# Patient Record
Sex: Female | Born: 1965 | Race: Black or African American | Hispanic: No | Marital: Single | Smoking: Never smoker
Health system: Southern US, Community
[De-identification: ages and names within clinical notes are randomized; demographics above are authoritative.]

## PROBLEM LIST (undated history)

## (undated) DIAGNOSIS — E049 Nontoxic goiter, unspecified: Secondary | ICD-10-CM

## (undated) DIAGNOSIS — E039 Hypothyroidism, unspecified: Secondary | ICD-10-CM

## (undated) HISTORY — PX: TUBAL LIGATION: SHX77

## (undated) HISTORY — PX: TONSILLECTOMY: SUR1361

## (undated) HISTORY — DX: Hypothyroidism, unspecified: E03.9

---

## 2006-01-13 ENCOUNTER — Ambulatory Visit: Payer: Self-pay | Admitting: Obstetrics & Gynecology

## 2006-01-27 ENCOUNTER — Ambulatory Visit: Payer: Self-pay

## 2006-04-20 ENCOUNTER — Ambulatory Visit: Payer: Self-pay | Admitting: Otolaryngology

## 2007-02-15 ENCOUNTER — Emergency Department: Payer: Self-pay | Admitting: Emergency Medicine

## 2007-11-21 ENCOUNTER — Ambulatory Visit: Payer: Self-pay

## 2009-01-29 ENCOUNTER — Ambulatory Visit: Payer: Self-pay

## 2009-02-13 ENCOUNTER — Ambulatory Visit: Payer: Self-pay

## 2009-09-26 ENCOUNTER — Emergency Department: Payer: Self-pay | Admitting: Internal Medicine

## 2010-02-18 ENCOUNTER — Ambulatory Visit: Payer: Self-pay | Admitting: Unknown Physician Specialty

## 2010-02-26 ENCOUNTER — Ambulatory Visit: Payer: Self-pay

## 2011-03-17 ENCOUNTER — Ambulatory Visit: Payer: Self-pay

## 2012-04-11 ENCOUNTER — Ambulatory Visit: Payer: Self-pay

## 2013-03-20 ENCOUNTER — Ambulatory Visit: Payer: Self-pay | Admitting: Family Medicine

## 2014-10-16 ENCOUNTER — Ambulatory Visit
Admit: 2014-10-16 | Disposition: A | Discharge status: Home / Self Care | Payer: Self-pay | Attending: Family Medicine | Admitting: Family Medicine

## 2015-02-25 ENCOUNTER — Other Ambulatory Visit: Payer: Self-pay

## 2015-02-25 DIAGNOSIS — Z01419 Encounter for gynecological examination (general) (routine) without abnormal findings: Secondary | ICD-10-CM

## 2015-03-20 ENCOUNTER — Other Ambulatory Visit: Payer: Self-pay | Admitting: Family Medicine

## 2015-03-20 ENCOUNTER — Ambulatory Visit
Admission: RE | Admit: 2015-03-20 | Discharge: 2015-03-20 | Disposition: A | Discharge status: Home / Self Care | Payer: BLUE CROSS/BLUE SHIELD | Source: Ambulatory Visit | Attending: Family Medicine | Admitting: Family Medicine

## 2015-03-20 ENCOUNTER — Ambulatory Visit
Admission: AD | Admit: 2015-03-20 | Discharge: 2015-03-20 | Disposition: A | Discharge status: Home / Self Care | Payer: BLUE CROSS/BLUE SHIELD | Attending: Family Medicine | Admitting: Family Medicine

## 2015-03-20 DIAGNOSIS — IMO0002 Reserved for concepts with insufficient information to code with codable children: Secondary | ICD-10-CM

## 2015-03-20 DIAGNOSIS — R229 Localized swelling, mass and lump, unspecified: Secondary | ICD-10-CM | POA: Diagnosis not present

## 2015-03-20 DIAGNOSIS — R609 Edema, unspecified: Secondary | ICD-10-CM | POA: Insufficient documentation

## 2015-04-25 ENCOUNTER — Other Ambulatory Visit: Payer: Self-pay | Admitting: Otolaryngology

## 2015-04-25 DIAGNOSIS — R221 Localized swelling, mass and lump, neck: Secondary | ICD-10-CM

## 2015-04-28 ENCOUNTER — Ambulatory Visit
Admission: RE | Admit: 2015-04-28 | Discharge: 2015-04-28 | Disposition: A | Discharge status: Home / Self Care | Payer: BLUE CROSS/BLUE SHIELD | Source: Ambulatory Visit | Attending: Otolaryngology | Admitting: Otolaryngology

## 2015-04-28 DIAGNOSIS — E041 Nontoxic single thyroid nodule: Secondary | ICD-10-CM | POA: Insufficient documentation

## 2015-04-28 DIAGNOSIS — R221 Localized swelling, mass and lump, neck: Secondary | ICD-10-CM

## 2015-04-28 MED ORDER — IOHEXOL 300 MG/ML  SOLN
75.0000 mL | Freq: Once | INTRAMUSCULAR | Status: AC | PRN
  Administered 2015-04-28: 75 mL via INTRAVENOUS

## 2015-05-02 ENCOUNTER — Other Ambulatory Visit: Payer: Self-pay | Admitting: Otolaryngology

## 2015-05-02 DIAGNOSIS — E041 Nontoxic single thyroid nodule: Secondary | ICD-10-CM

## 2015-05-07 ENCOUNTER — Other Ambulatory Visit: Payer: Self-pay | Admitting: Radiology

## 2015-05-08 ENCOUNTER — Ambulatory Visit
Admission: RE | Admit: 2015-05-08 | Discharge: 2015-05-08 | Disposition: A | Discharge status: Home / Self Care | Payer: BLUE CROSS/BLUE SHIELD | Source: Ambulatory Visit | Attending: Otolaryngology | Admitting: Otolaryngology

## 2015-05-08 DIAGNOSIS — E041 Nontoxic single thyroid nodule: Secondary | ICD-10-CM | POA: Diagnosis present

## 2015-05-08 NOTE — Discharge Instructions (Signed)
Thyroid Biopsy, Care After  Refer to this sheet in the next few weeks. These instructions provide you with information on caring for yourself after your procedure. Your health care provider may also give you more specific instructions. Your treatment has been planned according to current medical practices, but problems sometimes occur. Call your health care provider if you have any problems or questions after your procedure.   WHAT TO EXPECT AFTER THE PROCEDURE  After your procedure, it is typical to have the following:   You may have soreness and tenderness at the biopsy site for a few days.   You may have a sore throat or a hoarse voice if you had an open biopsy. This should go away after a couple days.  HOME CARE INSTRUCTIONS   Take medicines only as directed by your health care provider.   To ease discomfort at the biopsy site:    Keep your head raised on a pillow when you are lying down.    Support the back of your head and neck with both hands as you sit up from a lying position.    If you have a sore throat, try using throat lozenges or gargling with warm salt water.    Keep all follow-up visits as directed by your health care provider. This is important.  SEEK MEDICAL CARE IF:   You have a fever.  SEEK IMMEDIATE MEDICAL CARE IF:   You have severe bleeding from the biopsy site.    You have difficulty swallowing.    You have drainage, redness, swelling, or pain at the biopsy site.    You have swollen glands (lymph nodes) in your neck.      This information is not intended to replace advice given to you by your health care provider. Make sure you discuss any questions you have with your health care provider.     Document Released: 01/09/2014 Document Reviewed: 01/09/2014  Elsevier Interactive Patient Education 2016 [employer]

## 2015-05-09 LAB — CYTOLOGY - NON PAP

## 2015-05-21 ENCOUNTER — Encounter: Payer: Self-pay | Admitting: *Deleted

## 2015-05-26 NOTE — Discharge Instructions (Signed)

## 2015-05-27 ENCOUNTER — Encounter
Admission: RE | Disposition: A | Discharge status: Home / Self Care | Payer: Self-pay | Source: Ambulatory Visit | Attending: Otolaryngology

## 2015-05-27 ENCOUNTER — Ambulatory Visit: Payer: BLUE CROSS/BLUE SHIELD | Admitting: Anesthesiology

## 2015-05-27 ENCOUNTER — Ambulatory Visit
Admission: RE | Admit: 2015-05-27 | Discharge: 2015-05-27 | Disposition: A | Discharge status: Home / Self Care | Payer: BLUE CROSS/BLUE SHIELD | Source: Ambulatory Visit | Attending: Otolaryngology | Admitting: Otolaryngology

## 2015-05-27 DIAGNOSIS — Z8261 Family history of arthritis: Secondary | ICD-10-CM | POA: Insufficient documentation

## 2015-05-27 DIAGNOSIS — Z8249 Family history of ischemic heart disease and other diseases of the circulatory system: Secondary | ICD-10-CM | POA: Insufficient documentation

## 2015-05-27 DIAGNOSIS — Z79899 Other long term (current) drug therapy: Secondary | ICD-10-CM | POA: Insufficient documentation

## 2015-05-27 DIAGNOSIS — D170 Benign lipomatous neoplasm of skin and subcutaneous tissue of head, face and neck: Secondary | ICD-10-CM | POA: Insufficient documentation

## 2015-05-27 HISTORY — DX: Nontoxic goiter, unspecified: E04.9

## 2015-05-27 HISTORY — PX: EXCISION MASS NECK: SHX6703

## 2015-05-27 SURGERY — EXCISION, MASS, NECK
Anesthesia: General | Site: Neck | Laterality: Right | Wound class: Clean

## 2015-05-27 MED ORDER — DOUBLE ANTIBIOTIC 500-10000 UNIT/GM EX OINT
TOPICAL_OINTMENT | CUTANEOUS | Status: DC | PRN
  Administered 2015-05-27: 1 via TOPICAL

## 2015-05-27 MED ORDER — LACTATED RINGERS IV SOLN
INTRAVENOUS | Status: DC
  Administered 2015-05-27: 11:00:00 via INTRAVENOUS

## 2015-05-27 MED ORDER — FENTANYL CITRATE (PF) 100 MCG/2ML IJ SOLN: 25.0000 ug | INTRAMUSCULAR | Status: DC | PRN

## 2015-05-27 MED ORDER — HYDROCODONE-ACETAMINOPHEN 5-325 MG PO TABS: 1.0000 | ORAL_TABLET | ORAL | Status: DC | PRN

## 2015-05-27 MED ORDER — OXYCODONE HCL 5 MG/5ML PO SOLN: 5.0000 mg | Freq: Once | ORAL | Status: DC | PRN

## 2015-05-27 MED ORDER — DEXAMETHASONE SODIUM PHOSPHATE 4 MG/ML IJ SOLN
INTRAMUSCULAR | Status: DC | PRN
  Administered 2015-05-27: 8 mg via INTRAVENOUS

## 2015-05-27 MED ORDER — LIDOCAINE-EPINEPHRINE 1 %-1:100000 IJ SOLN
INTRAMUSCULAR | Status: DC | PRN
  Administered 2015-05-27: 2 mL

## 2015-05-27 MED ORDER — OXYCODONE HCL 5 MG PO TABS: 5.0000 mg | ORAL_TABLET | Freq: Once | ORAL | Status: DC | PRN

## 2015-05-27 MED ORDER — PROMETHAZINE HCL 25 MG/ML IJ SOLN: 6.2500 mg | INTRAMUSCULAR | Status: DC | PRN

## 2015-05-27 MED ORDER — FENTANYL CITRATE (PF) 100 MCG/2ML IJ SOLN
INTRAMUSCULAR | Status: DC | PRN
  Administered 2015-05-27: 50 ug via INTRAVENOUS

## 2015-05-27 MED ORDER — MIDAZOLAM HCL 5 MG/5ML IJ SOLN
INTRAMUSCULAR | Status: DC | PRN
  Administered 2015-05-27: 2 mg via INTRAVENOUS

## 2015-05-27 MED ORDER — ONDANSETRON HCL 4 MG/2ML IJ SOLN
INTRAMUSCULAR | Status: DC | PRN
  Administered 2015-05-27: 4 mg via INTRAVENOUS

## 2015-05-27 MED ORDER — PROPOFOL 10 MG/ML IV BOLUS
INTRAVENOUS | Status: DC | PRN
  Administered 2015-05-27: 100 mg via INTRAVENOUS

## 2015-05-27 MED ORDER — LIDOCAINE HCL (CARDIAC) 20 MG/ML IV SOLN
INTRAVENOUS | Status: DC | PRN
  Administered 2015-05-27: 10 mg via INTRATRACHEAL

## 2015-05-27 MED ORDER — GLYCOPYRROLATE 0.2 MG/ML IJ SOLN
INTRAMUSCULAR | Status: DC | PRN
  Administered 2015-05-27: .2 mg via INTRAVENOUS

## 2015-05-27 SURGICAL SUPPLY — 36 items
BLADE SURG 15 STRL LF DISP TIS (BLADE) IMPLANT
BLADE SURG 15 STRL SS (BLADE)
CANISTER SUCT 1200ML W/VALVE (MISCELLANEOUS) IMPLANT
CORD BIP STRL DISP 12FT (MISCELLANEOUS) ×2 IMPLANT
DECANTER SPIKE VIAL GLASS SM (MISCELLANEOUS) ×2 IMPLANT
DERMABOND ADVANCED (GAUZE/BANDAGES/DRESSINGS) ×1
DERMABOND ADVANCED .7 DNX12 (GAUZE/BANDAGES/DRESSINGS) ×1 IMPLANT
DRESSING TELFA 4X3 1S ST N-ADH (GAUZE/BANDAGES/DRESSINGS) ×2 IMPLANT
DRSG TEGADERM 2-3/8X2-3/4 SM (GAUZE/BANDAGES/DRESSINGS) IMPLANT
DRSG TEGADERM 4X4.75 (GAUZE/BANDAGES/DRESSINGS) ×2 IMPLANT
DRSG TELFA 3X8 NADH (GAUZE/BANDAGES/DRESSINGS) ×2 IMPLANT
ELECT CAUTERY BLADE TIP 2.5 (TIP)
ELECTRODE CAUTERY BLDE TIP 2.5 (TIP) IMPLANT
GAUZE SPONGE 4X4 12PLY STRL (GAUZE/BANDAGES/DRESSINGS) ×2 IMPLANT
GLOVE BIO SURGEON STRL SZ7.5 (GLOVE) ×4 IMPLANT
GOWN STRL REUS W/ TWL LRG LVL3 (GOWN DISPOSABLE) IMPLANT
GOWN STRL REUS W/TWL LRG LVL3 (GOWN DISPOSABLE)
KIT ROOM TURNOVER OR (KITS) ×2 IMPLANT
NEEDLE HYPO 25GX1X1/2 BEV (NEEDLE) ×2 IMPLANT
NS IRRIG 500ML POUR BTL (IV SOLUTION) ×2 IMPLANT
PACK HEAD/NECK (MISCELLANEOUS) ×2 IMPLANT
PAD GROUND ADULT SPLIT (MISCELLANEOUS) IMPLANT
SPONGE KITTNER 5P (MISCELLANEOUS) IMPLANT
STAPLER SKIN PROX 35W (STAPLE) IMPLANT
SUCTION FRAZIER TIP 10 FR DISP (SUCTIONS) ×2 IMPLANT
SUT PROLENE 5 0 P 3 (SUTURE) ×2 IMPLANT
SUT SILK 2 0 (SUTURE)
SUT SILK 2-0 18XBRD TIE 12 (SUTURE) IMPLANT
SUT SILK 3 0 (SUTURE)
SUT SILK 3-0 18XBRD TIE 12 (SUTURE) IMPLANT
SUT VIC AB 4-0 FS2 27 (SUTURE) IMPLANT
SUT VIC AB 4-0 RB1 18 (SUTURE) IMPLANT
SUT VIC AB 4-0 RB1 27 (SUTURE) ×1
SUT VIC AB 4-0 RB1 27X BRD (SUTURE) ×1 IMPLANT
SYR BULB IRRIG 60ML STRL (SYRINGE) ×2 IMPLANT
SYRINGE 10CC LL (SYRINGE) ×2 IMPLANT

## 2015-05-27 NOTE — Anesthesia Procedure Notes (Signed)
Procedure Name: LMA Insertion Date/Time: 05/27/2015 12:42 PM Performed by: Londell Moh Pre-anesthesia Checklist: Patient identified, Emergency Drugs available, Suction available, Timeout performed and Patient being monitored Patient Re-evaluated:Patient Re-evaluated prior to inductionOxygen Delivery Method: Circle system utilized Preoxygenation: Pre-oxygenation with 100% oxygen Intubation Type: IV induction LMA: LMA inserted LMA Size: 4.0 Number of attempts: 1 Placement Confirmation: positive ETCO2 and breath sounds checked- equal and bilateral Tube secured with: Tape

## 2015-05-27 NOTE — Anesthesia Preprocedure Evaluation (Signed)
Anesthesia Evaluation  Patient identified by MRN, date of birth, ID band Patient awake    Reviewed: Allergy & Precautions, NPO status , Patient's Chart, lab work & pertinent test results  Airway Mallampati: I       Dental   Pulmonary neg pulmonary ROS,           Cardiovascular negative cardio ROS    Mets 4+   Neuro/Psych negative neurological ROS  negative psych ROS   GI/Hepatic negative GI ROS, Neg liver ROS,   Endo/Other  negative endocrine ROS  Renal/GU negative Renal ROS  negative genitourinary   Musculoskeletal negative musculoskeletal ROS (+)   Abdominal   Peds negative pediatric ROS (+)  Hematology negative hematology ROS (+)   Anesthesia Other Findings Pt with a 17 mm X 13 mm thyroid mass.  Non-secreting.  Cyst does not affect airway.  Pt can recline and breath without difficulty.  Reproductive/Obstetrics negative OB ROS                             Anesthesia Physical Anesthesia Plan  ASA: II  Anesthesia Plan: General   Post-op Pain Management:    Induction: Intravenous  Airway Management Planned: LMA  Additional Equipment:   Intra-op Plan:   Post-operative Plan: Extubation in OR  Informed Consent: I have reviewed the patients History and Physical, chart, labs and discussed the procedure including the risks, benefits and alternatives for the proposed anesthesia with the patient or authorized representative who has indicated his/her understanding and acceptance.   Dental advisory given  Plan Discussed with: CRNA  Anesthesia Plan Comments:         Anesthesia Quick Evaluation

## 2015-05-27 NOTE — Anesthesia Postprocedure Evaluation (Signed)
Anesthesia Post Note  Patient: Ashley [name]  Procedure(s) Performed: Procedure(s) (LRB): EXCISION MASS NECK RIGHT LIPOMA (Right)  Patient location during evaluation: PACU Anesthesia Type: General Level of consciousness: awake and alert Pain management: pain level controlled Vital Signs Assessment: post-procedure vital signs reviewed and stable Respiratory status: spontaneous breathing, nonlabored ventilation, respiratory function stable and patient connected to nasal cannula oxygen Cardiovascular status: blood pressure returned to baseline and stable Postop Assessment: no signs of nausea or vomiting Anesthetic complications: no    Brexley Cutshaw C

## 2015-05-27 NOTE — Op Note (Signed)
05/27/2015  1:29 PM    Ashley [name]  [id number]   Pre-Op Diagnosis:  RIGHT NECK LIPOMA Post-op Diagnosis: RIGHT NECK LIPOMA  Procedure:   Excision of right lower neck lipoma, intermediate closure (3 cm closure)  Surgeon:  Riley Nearing  Anesthesia:  General with laryngeal mask airway  EBL:  Minimal  Complications:  None  Findings: 4 x 2.5 cm lipoma in the lower neck above the right clavicle  Procedure: After the patient was identified in holding and the procedure was reviewed.  The patient was taken to the operating room and with the patient in a comfortable supine position,  general laryngeal mask anesthesia was induced without difficulty.  A proper time-out was performed, confirming the operative site and procedure.  The skin over the lipoma was injected with 1% lidocaine with epinephrine 1 100,000, approximately 2 cc. The patient was then prepped and draped in usual sterile fashion.  A 15 blade was used to incise the skin. Dissection proceeded down through the platysma which was divided with the bipolar cautery. The capsule of the lipoma was encountered and careful dissection both blunt and sharp around the capsule of the lipoma was performed, dividing small capillaries with the bipolar cautery for hemostasis. The lipoma was relatively well encapsulated and excised completely in this fashion. Hemostasis was excellent. The dead space was closed using 4-0 Vicryl suture in an interrupted fashion. The platysma and subcutaneous tissues were then closed with 4-0 Vicryl followed by 5-0 Prolene in a running locked stitch for the skin. A dressing was then applied over the wound after application of bacitracin ointment.  The patient was then returned to the anesthesiologist in good condition for awakening. The patient was awakened and taken to the recovery room in good condition.   Disposition:   PACU then discharge home  Plan: Polysporin to wound twice daily, clean wound as  needed with hydrogen peroxide.  Riley Nearing 05/27/2015 1:29 PM

## 2015-05-27 NOTE — Transfer of Care (Signed)
Immediate Anesthesia Transfer of Care Note  Patient: Ashley [name]  Procedure(s) Performed: Procedure(s): EXCISION MASS NECK RIGHT LIPOMA (Right)  Patient Location: PACU  Anesthesia Type: General  Level of Consciousness: awake, alert  and patient cooperative  Airway and Oxygen Therapy: Patient Spontanous Breathing and Patient connected to supplemental oxygen  Post-op Assessment: Post-op Vital signs reviewed, Patient's Cardiovascular Status Stable, Respiratory Function Stable, Patent Airway and No signs of Nausea or vomiting  Post-op Vital Signs: Reviewed and stable  Complications: No apparent anesthesia complications

## 2015-05-28 ENCOUNTER — Encounter: Payer: Self-pay | Admitting: Otolaryngology

## 2015-05-29 LAB — SURGICAL PATHOLOGY

## 2015-11-04 ENCOUNTER — Encounter: Payer: Self-pay | Admitting: Obstetrics and Gynecology

## 2015-11-04 ENCOUNTER — Ambulatory Visit (INDEPENDENT_AMBULATORY_CARE_PROVIDER_SITE_OTHER): Payer: BLUE CROSS/BLUE SHIELD | Admitting: Obstetrics and Gynecology

## 2015-11-04 VITALS — BP 123/81 | HR 88 | Ht 65.0 in | Wt 184.7 lb

## 2015-11-04 DIAGNOSIS — Z01419 Encounter for gynecological examination (general) (routine) without abnormal findings: Secondary | ICD-10-CM

## 2015-11-04 DIAGNOSIS — N949 Unspecified condition associated with female genital organs and menstrual cycle: Secondary | ICD-10-CM | POA: Diagnosis not present

## 2015-11-04 DIAGNOSIS — Z1211 Encounter for screening for malignant neoplasm of colon: Secondary | ICD-10-CM | POA: Diagnosis not present

## 2015-11-04 DIAGNOSIS — E669 Obesity, unspecified: Secondary | ICD-10-CM | POA: Diagnosis not present

## 2015-11-04 DIAGNOSIS — N943 Premenstrual tension syndrome: Secondary | ICD-10-CM

## 2015-11-04 MED ORDER — NORETHINDRONE 0.35 MG PO TABS: 1.0000 | ORAL_TABLET | Freq: Every day | ORAL | Status: DC
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
## 2015-11-04 NOTE — Patient Instructions (Addendum)
Begin Sunday start after next period for birth control.      Preventive Care for Adults, Female A healthy lifestyle and preventive care can promote health and wellness. Preventive health guidelines for women include the following key practices.  A routine yearly physical is a good way to check with your health care provider about your health and preventive screening. It is a chance to share any concerns and updates on your health and to receive a thorough exam.  Visit your dentist for a routine exam and preventive care every 6 months. Brush your teeth twice a day and floss once a day. Good oral hygiene prevents tooth decay and gum disease.  The frequency of eye exams is based on your age, health, family medical history, use of contact lenses, and other factors. Follow your health care provider's recommendations for frequency of eye exams.  Eat a healthy diet. Foods like vegetables, fruits, whole grains, low-fat dairy products, and lean protein foods contain the nutrients you need without too many calories. Decrease your intake of foods high in solid fats, added sugars, and salt. Eat the right amount of calories for you.Get information about a proper diet from your health care provider, if necessary.  Regular physical exercise is one of the most important things you can do for your health. Most adults should get at least 150 minutes of moderate-intensity exercise (any activity that increases your heart rate and causes you to sweat) each week. In addition, most adults need muscle-strengthening exercises on 2 or more days a week.  Maintain a healthy weight. The body mass index (BMI) is a screening tool to identify possible weight problems. It provides an estimate of body fat based on height and weight. Your health care provider can find your BMI and can help you achieve or maintain a healthy weight.For adults 20 years and older:  A BMI below 18.5 is considered underweight.  A BMI of 18.5 to 24.9  is normal.  A BMI of 25 to 29.9 is considered overweight.  A BMI of 30 and above is considered obese.  Maintain normal blood lipids and cholesterol levels by exercising and minimizing your intake of saturated fat. Eat a balanced diet with plenty of fruit and vegetables. Blood tests for lipids and cholesterol should begin at age 26 and be repeated every 5 years. If your lipid or cholesterol levels are high, you are over 50, or you are at high risk for heart disease, you may need your cholesterol levels checked more frequently.Ongoing high lipid and cholesterol levels should be treated with medicines if diet and exercise are not working.  If you smoke, find out from your health care provider how to quit. If you do not use tobacco, do not start.  Lung cancer screening is recommended for adults aged 84-80 years who are at high risk for developing lung cancer because of a history of smoking. A yearly low-dose CT scan of the lungs is recommended for people who have at least a 30-pack-year history of smoking and are a current smoker or have quit within the past 15 years. A pack year of smoking is smoking an average of 1 pack of cigarettes a day for 1 year (for example: 1 pack a day for 30 years or 2 packs a day for 15 years). Yearly screening should continue until the smoker has stopped smoking for at least 15 years. Yearly screening should be stopped for people who develop a health problem that would prevent them from having lung  cancer treatment.  If you are pregnant, do not drink alcohol. If you are breastfeeding, be very cautious about drinking alcohol. If you are not pregnant and choose to drink alcohol, do not have more than 1 drink per day. One drink is considered to be 12 ounces (355 mL) of beer, 5 ounces (148 mL) of wine, or 1.5 ounces (44 mL) of liquor.  Avoid use of street drugs. Do not share needles with anyone. Ask for help if you need support or instructions about stopping the use of  drugs.  High blood pressure causes heart disease and increases the risk of stroke. Your blood pressure should be checked at least every 1 to 2 years. Ongoing high blood pressure should be treated with medicines if weight loss and exercise do not work.  If you are 55-79 years old, ask your health care provider if you should take aspirin to prevent strokes.  Diabetes screening is done by taking a blood sample to check your blood glucose level after you have not eaten for a certain period of time (fasting). If you are not overweight and you do not have risk factors for diabetes, you should be screened once every 3 years starting at age 45. If you are overweight or obese and you are 40-70 years of age, you should be screened for diabetes every year as part of your cardiovascular risk assessment.  Breast cancer screening is essential preventive care for women. You should practice "breast self-awareness." This means understanding the normal appearance and feel of your breasts and may include breast self-examination. Any changes detected, no matter how small, should be reported to a health care provider. Women in their 20s and 30s should have a clinical breast exam (CBE) by a health care provider as part of a regular health exam every 1 to 3 years. After age 40, women should have a CBE every year. Starting at age 40, women should consider having a mammogram (breast X-ray test) every year. Women who have a family history of breast cancer should talk to their health care provider about genetic screening. Women at a high risk of breast cancer should talk to their health care providers about having an MRI and a mammogram every year.  Breast cancer gene (BRCA)-related cancer risk assessment is recommended for women who have family members with BRCA-related cancers. BRCA-related cancers include breast, ovarian, tubal, and peritoneal cancers. Having family members with these cancers may be associated with an increased  risk for harmful changes (mutations) in the breast cancer genes BRCA1 and BRCA2. Results of the assessment will determine the need for genetic counseling and BRCA1 and BRCA2 testing.  Your health care provider may recommend that you be screened regularly for cancer of the pelvic organs (ovaries, uterus, and vagina). This screening involves a pelvic examination, including checking for microscopic changes to the surface of your cervix (Pap test). You may be encouraged to have this screening done every 3 years, beginning at age 21.  For women ages 30-65, health care providers may recommend pelvic exams and Pap testing every 3 years, or they may recommend the Pap and pelvic exam, combined with testing for human papilloma virus (HPV), every 5 years. Some types of HPV increase your risk of cervical cancer. Testing for HPV may also be done on women of any age with unclear Pap test results.  Other health care providers may not recommend any screening for nonpregnant women who are considered low risk for pelvic cancer and who do not have   symptoms. Ask your health care provider if a screening pelvic exam is right for you.  If you have had past treatment for cervical cancer or a condition that could lead to cancer, you need Pap tests and screening for cancer for at least 20 years after your treatment. If Pap tests have been discontinued, your risk factors (such as having a new sexual partner) need to be reassessed to determine if screening should resume. Some women have medical problems that increase the chance of getting cervical cancer. In these cases, your health care provider may recommend more frequent screening and Pap tests.  Colorectal cancer can be detected and often prevented. Most routine colorectal cancer screening begins at the age of 50 years and continues through age 75 years. However, your health care provider may recommend screening at an earlier age if you have risk factors for colon cancer. On a  yearly basis, your health care provider may provide home test kits to check for hidden blood in the stool. Use of a small camera at the end of a tube, to directly examine the colon (sigmoidoscopy or colonoscopy), can detect the earliest forms of colorectal cancer. Talk to your health care provider about this at age 50, when routine screening begins. Direct exam of the colon should be repeated every 5-10 years through age 75 years, unless early forms of precancerous polyps or small growths are found.  People who are at an increased risk for hepatitis B should be screened for this virus. You are considered at high risk for hepatitis B if:  You were born in a country where hepatitis B occurs often. Talk with your health care provider about which countries are considered high risk.  Your parents were born in a high-risk country and you have not received a shot to protect against hepatitis B (hepatitis B vaccine).  You have HIV or AIDS.  You use needles to inject street drugs.  You live with, or have sex with, someone who has hepatitis B.  You get hemodialysis treatment.  You take certain medicines for conditions like cancer, organ transplantation, and autoimmune conditions.  Hepatitis C blood testing is recommended for all people born from 1945 through 1965 and any individual with known risks for hepatitis C.  Practice safe sex. Use condoms and avoid high-risk sexual practices to reduce the spread of sexually transmitted infections (STIs). STIs include gonorrhea, chlamydia, syphilis, trichomonas, herpes, HPV, and human immunodeficiency virus (HIV). Herpes, HIV, and HPV are viral illnesses that have no cure. They can result in disability, cancer, and death.  You should be screened for sexually transmitted illnesses (STIs) including gonorrhea and chlamydia if:  You are sexually active and are younger than 24 years.  You are older than 24 years and your health care provider tells you that you are  at risk for this type of infection.  Your sexual activity has changed since you were last screened and you are at an increased risk for chlamydia or gonorrhea. Ask your health care provider if you are at risk.  If you are at risk of being infected with HIV, it is recommended that you take a prescription medicine daily to prevent HIV infection. This is called preexposure prophylaxis (PrEP). You are considered at risk if:  You are sexually active and do not regularly use condoms or know the HIV status of your partner(s).  You take drugs by injection.  You are sexually active with a partner who has HIV.  Talk with your health care   provider about whether you are at high risk of being infected with HIV. If you choose to begin PrEP, you should first be tested for HIV. You should then be tested every 3 months for as long as you are taking PrEP.  Osteoporosis is a disease in which the bones lose minerals and strength with aging. This can result in serious bone fractures or breaks. The risk of osteoporosis can be identified using a bone density scan. Women ages 65 years and over and women at risk for fractures or osteoporosis should discuss screening with their health care providers. Ask your health care provider whether you should take a calcium supplement or vitamin D to reduce the rate of osteoporosis.  Menopause can be associated with physical symptoms and risks. Hormone replacement therapy is available to decrease symptoms and risks. You should talk to your health care provider about whether hormone replacement therapy is right for you.  Use sunscreen. Apply sunscreen liberally and repeatedly throughout the day. You should seek shade when your shadow is shorter than you. Protect yourself by wearing long sleeves, pants, a wide-brimmed hat, and sunglasses year round, whenever you are outdoors.  Once a month, do a whole body skin exam, using a mirror to look at the skin on your back. Tell your health  care provider of new moles, moles that have irregular borders, moles that are larger than a pencil eraser, or moles that have changed in shape or color.  Stay current with required vaccines (immunizations).  Influenza vaccine. All adults should be immunized every year.  Tetanus, diphtheria, and acellular pertussis (Td, Tdap) vaccine. Pregnant women should receive 1 dose of Tdap vaccine during each pregnancy. The dose should be obtained regardless of the length of time since the last dose. Immunization is preferred during the 27th-36th week of gestation. An adult who has not previously received Tdap or who does not know her vaccine status should receive 1 dose of Tdap. This initial dose should be followed by tetanus and diphtheria toxoids (Td) booster doses every 10 years. Adults with an unknown or incomplete history of completing a 3-dose immunization series with Td-containing vaccines should begin or complete a primary immunization series including a Tdap dose. Adults should receive a Td booster every 10 years.  Varicella vaccine. An adult without evidence of immunity to varicella should receive 2 doses or a second dose if she has previously received 1 dose. Pregnant females who do not have evidence of immunity should receive the first dose after pregnancy. This first dose should be obtained before leaving the health care facility. The second dose should be obtained 4-8 weeks after the first dose.  Human papillomavirus (HPV) vaccine. Females aged 13-26 years who have not received the vaccine previously should obtain the 3-dose series. The vaccine is not recommended for use in pregnant females. However, pregnancy testing is not needed before receiving a dose. If a female is found to be pregnant after receiving a dose, no treatment is needed. In that case, the remaining doses should be delayed until after the pregnancy. Immunization is recommended for any person with an immunocompromised condition through  the age of 26 years if she did not get any or all doses earlier. During the 3-dose series, the second dose should be obtained 4-8 weeks after the first dose. The third dose should be obtained 24 weeks after the first dose and 16 weeks after the second dose.  Zoster vaccine. One dose is recommended for adults aged 60 years or older   unless certain conditions are present.  Measles, mumps, and rubella (MMR) vaccine. Adults born before [birth year] generally are considered immune to measles and mumps. Adults born in [birth year] or later should have 1 or more doses of MMR vaccine unless there is a contraindication to the vaccine or there is laboratory evidence of immunity to each of the three diseases. A routine second dose of MMR vaccine should be obtained at least 28 days after the first dose for students attending postsecondary schools, health care workers, or international travelers. People who received inactivated measles vaccine or an unknown type of measles vaccine during 1963-1967 should receive 2 doses of MMR vaccine. People who received inactivated mumps vaccine or an unknown type of mumps vaccine before 1979 and are at high risk for mumps infection should consider immunization with 2 doses of MMR vaccine. For females of childbearing age, rubella immunity should be determined. If there is no evidence of immunity, females who are not pregnant should be vaccinated. If there is no evidence of immunity, females who are pregnant should delay immunization until after pregnancy. Unvaccinated health care workers born before [birth year] who lack laboratory evidence of measles, mumps, or rubella immunity or laboratory confirmation of disease should consider measles and mumps immunization with 2 doses of MMR vaccine or rubella immunization with 1 dose of MMR vaccine.  Pneumococcal 13-valent conjugate (PCV13) vaccine. When indicated, a person who is uncertain of his immunization history and has no record of immunization should receive the  PCV13 vaccine. All adults 67 years of age and older should receive this vaccine. An adult aged 15 years or older who has certain medical conditions and has not been previously immunized should receive 1 dose of PCV13 vaccine. This PCV13 should be followed with a dose of pneumococcal polysaccharide (PPSV23) vaccine. Adults who are at high risk for pneumococcal disease should obtain the PPSV23 vaccine at least 8 weeks after the dose of PCV13 vaccine. Adults older than 49 years of age who have normal immune system function should obtain the PPSV23 vaccine dose at least 1 year after the dose of PCV13 vaccine.  Pneumococcal polysaccharide (PPSV23) vaccine. When PCV13 is also indicated, PCV13 should be obtained first. All adults aged 68 years and older should be immunized. An adult younger than age 24 years who has certain medical conditions should be immunized. Any person who resides in a nursing home or long-term care facility should be immunized. An adult smoker should be immunized. People with an immunocompromised condition and certain other conditions should receive both PCV13 and PPSV23 vaccines. People with human immunodeficiency virus (HIV) infection should be immunized as soon as possible after diagnosis. Immunization during chemotherapy or radiation therapy should be avoided. Routine use of PPSV23 vaccine is not recommended for American Indians, [place] Natives, or people younger than 65 years unless there are medical conditions that require PPSV23 vaccine. When indicated, people who have unknown immunization and have no record of immunization should receive PPSV23 vaccine. One-time revaccination 5 years after the first dose of PPSV23 is recommended for people aged 19-64 years who have chronic kidney failure, nephrotic syndrome, asplenia, or immunocompromised conditions. People who received 1-2 doses of PPSV23 before age 11 years should receive another dose of PPSV23 vaccine at age 25 years or later if at least  5 years have passed since the previous dose. Doses of PPSV23 are not needed for people immunized with PPSV23 at or after age 12 years.  Meningococcal vaccine. Adults with asplenia or persistent complement component deficiencies should  receive 2 doses of quadrivalent meningococcal conjugate (MenACWY-D) vaccine. The doses should be obtained at least 2 months apart. Microbiologists working with certain meningococcal bacteria, military recruits, people at risk during an outbreak, and people who travel to or live in countries with a high rate of meningitis should be immunized. A first-year college student up through age 21 years who is living in a residence hall should receive a dose if she did not receive a dose on or after her 16th birthday. Adults who have certain high-risk conditions should receive one or more doses of vaccine.  Hepatitis A vaccine. Adults who wish to be protected from this disease, have certain high-risk conditions, work with hepatitis A-infected animals, work in hepatitis A research labs, or travel to or work in countries with a high rate of hepatitis A should be immunized. Adults who were previously unvaccinated and who anticipate close contact with an international adoptee during the first 60 days after arrival in the [place] from a country with a high rate of hepatitis A should be immunized.  Hepatitis B vaccine. Adults who wish to be protected from this disease, have certain high-risk conditions, may be exposed to blood or other infectious body fluids, are household contacts or sex partners of hepatitis B positive people, are clients or workers in certain care facilities, or travel to or work in countries with a high rate of hepatitis B should be immunized.  Haemophilus influenzae type b (Hib) vaccine. A previously unvaccinated person with asplenia or sickle cell disease or having a scheduled splenectomy should receive 1 dose of Hib vaccine. Regardless of previous immunization, a  recipient of a hematopoietic stem cell transplant should receive a 3-dose series 6-12 months after her successful transplant. Hib vaccine is not recommended for adults with HIV infection. Preventive Services / Frequency Ages 19 to 39 years  Blood pressure check.** / Every 3-5 years.  Lipid and cholesterol check.** / Every 5 years beginning at age 20.  Clinical breast exam.** / Every 3 years for women in their 20s and 30s.  BRCA-related cancer risk assessment.** / For women who have family members with a BRCA-related cancer (breast, ovarian, tubal, or peritoneal cancers).  Pap test.** / Every 2 years from ages 21 through 29. Every 3 years starting at age 30 through age 65 or 70 with a history of 3 consecutive normal Pap tests.  HPV screening.** / Every 3 years from ages 30 through ages 65 to 70 with a history of 3 consecutive normal Pap tests.  Hepatitis C blood test.** / For any individual with known risks for hepatitis C.  Skin self-exam. / Monthly.  Influenza vaccine. / Every year.  Tetanus, diphtheria, and acellular pertussis (Tdap, Td) vaccine.** / Consult your health care provider. Pregnant women should receive 1 dose of Tdap vaccine during each pregnancy. 1 dose of Td every 10 years.  Varicella vaccine.** / Consult your health care provider. Pregnant females who do not have evidence of immunity should receive the first dose after pregnancy.  HPV vaccine. / 3 doses over 6 months, if 26 and younger. The vaccine is not recommended for use in pregnant females. However, pregnancy testing is not needed before receiving a dose.  Measles, mumps, rubella (MMR) vaccine.** / You need at least 1 dose of MMR if you were born in 1957 or later. You may also need a 2nd dose. For females of childbearing age, rubella immunity should be determined. If there is no evidence of immunity, females who are not   pregnant should be vaccinated. If there is no evidence of immunity, females who are pregnant  should delay immunization until after pregnancy.  Pneumococcal 13-valent conjugate (PCV13) vaccine.** / Consult your health care provider.  Pneumococcal polysaccharide (PPSV23) vaccine.** / 1 to 2 doses if you smoke cigarettes or if you have certain conditions.  Meningococcal vaccine.** / 1 dose if you are age 19 to 21 years and a first-year college student living in a residence hall, or have one of several medical conditions, you need to get vaccinated against meningococcal disease. You may also need additional booster doses.  Hepatitis A vaccine.** / Consult your health care provider.  Hepatitis B vaccine.** / Consult your health care provider.  Haemophilus influenzae type b (Hib) vaccine.** / Consult your health care provider. Ages 40 to 64 years  Blood pressure check.** / Every year.  Lipid and cholesterol check.** / Every 5 years beginning at age 20 years.  Lung cancer screening. / Every year if you are aged 55-80 years and have a 30-pack-year history of smoking and currently smoke or have quit within the past 15 years. Yearly screening is stopped once you have quit smoking for at least 15 years or develop a health problem that would prevent you from having lung cancer treatment.  Clinical breast exam.** / Every year after age 40 years.  BRCA-related cancer risk assessment.** / For women who have family members with a BRCA-related cancer (breast, ovarian, tubal, or peritoneal cancers).  Mammogram.** / Every year beginning at age 40 years and continuing for as long as you are in good health. Consult with your health care provider.  Pap test.** / Every 3 years starting at age 30 years through age 65 or 70 years with a history of 3 consecutive normal Pap tests.  HPV screening.** / Every 3 years from ages 30 years through ages 65 to 70 years with a history of 3 consecutive normal Pap tests.  Fecal occult blood test (FOBT) of stool. / Every year beginning at age 50 years and  continuing until age 75 years. You may not need to do this test if you get a colonoscopy every 10 years.  Flexible sigmoidoscopy or colonoscopy.** / Every 5 years for a flexible sigmoidoscopy or every 10 years for a colonoscopy beginning at age 50 years and continuing until age 75 years.  Hepatitis C blood test.** / For all people born from 1945 through 1965 and any individual with known risks for hepatitis C.  Skin self-exam. / Monthly.  Influenza vaccine. / Every year.  Tetanus, diphtheria, and acellular pertussis (Tdap/Td) vaccine.** / Consult your health care provider. Pregnant women should receive 1 dose of Tdap vaccine during each pregnancy. 1 dose of Td every 10 years.  Varicella vaccine.** / Consult your health care provider. Pregnant females who do not have evidence of immunity should receive the first dose after pregnancy.  Zoster vaccine.** / 1 dose for adults aged 60 years or older.  Measles, mumps, rubella (MMR) vaccine.** / You need at least 1 dose of MMR if you were born in 1957 or later. You may also need a second dose. For females of childbearing age, rubella immunity should be determined. If there is no evidence of immunity, females who are not pregnant should be vaccinated. If there is no evidence of immunity, females who are pregnant should delay immunization until after pregnancy.  Pneumococcal 13-valent conjugate (PCV13) vaccine.** / Consult your health care provider.  Pneumococcal polysaccharide (PPSV23) vaccine.** / 1 to 2 doses   if you smoke cigarettes or if you have certain conditions.  Meningococcal vaccine.** / Consult your health care provider.  Hepatitis A vaccine.** / Consult your health care provider.  Hepatitis B vaccine.** / Consult your health care provider.  Haemophilus influenzae type b (Hib) vaccine.** / Consult your health care provider. Ages 65 years and over  Blood pressure check.** / Every year.  Lipid and cholesterol check.** / Every 5 years  beginning at age 20 years.  Lung cancer screening. / Every year if you are aged 55-80 years and have a 30-pack-year history of smoking and currently smoke or have quit within the past 15 years. Yearly screening is stopped once you have quit smoking for at least 15 years or develop a health problem that would prevent you from having lung cancer treatment.  Clinical breast exam.** / Every year after age 40 years.  BRCA-related cancer risk assessment.** / For women who have family members with a BRCA-related cancer (breast, ovarian, tubal, or peritoneal cancers).  Mammogram.** / Every year beginning at age 40 years and continuing for as long as you are in good health. Consult with your health care provider.  Pap test.** / Every 3 years starting at age 30 years through age 65 or 70 years with 3 consecutive normal Pap tests. Testing can be stopped between 65 and 70 years with 3 consecutive normal Pap tests and no abnormal Pap or HPV tests in the past 10 years.  HPV screening.** / Every 3 years from ages 30 years through ages 65 or 70 years with a history of 3 consecutive normal Pap tests. Testing can be stopped between 65 and 70 years with 3 consecutive normal Pap tests and no abnormal Pap or HPV tests in the past 10 years.  Fecal occult blood test (FOBT) of stool. / Every year beginning at age 50 years and continuing until age 75 years. You may not need to do this test if you get a colonoscopy every 10 years.  Flexible sigmoidoscopy or colonoscopy.** / Every 5 years for a flexible sigmoidoscopy or every 10 years for a colonoscopy beginning at age 50 years and continuing until age 75 years.  Hepatitis C blood test.** / For all people born from 1945 through 1965 and any individual with known risks for hepatitis C.  Osteoporosis screening.** / A one-time screening for women ages 65 years and over and women at risk for fractures or osteoporosis.  Skin self-exam. / Monthly.  Influenza vaccine. /  Every year.  Tetanus, diphtheria, and acellular pertussis (Tdap/Td) vaccine.** / 1 dose of Td every 10 years.  Varicella vaccine.** / Consult your health care provider.  Zoster vaccine.** / 1 dose for adults aged 60 years or older.  Pneumococcal 13-valent conjugate (PCV13) vaccine.** / Consult your health care provider.  Pneumococcal polysaccharide (PPSV23) vaccine.** / 1 dose for all adults aged 65 years and older.  Meningococcal vaccine.** / Consult your health care provider.  Hepatitis A vaccine.** / Consult your health care provider.  Hepatitis B vaccine.** / Consult your health care provider.  Haemophilus influenzae type b (Hib) vaccine.** / Consult your health care provider. ** Family history and personal history of risk and conditions may change your health care provider's recommendations.   This information is not intended to replace advice given to you by your health care provider. Make sure you discuss any questions you have with your health care provider.   Document Released: 08/10/2001 Document Revised: 07/05/2014 Document Reviewed: 11/09/2010 Elsevier Interactive Patient Education 2016   [employer].

## 2015-11-05 LAB — COMPREHENSIVE METABOLIC PANEL
A/G RATIO: 1.8 (ref 1.2–2.2)
ALK PHOS: 59 IU/L (ref 39–117)
ALT: 13 IU/L (ref 0–32)
AST: 16 IU/L (ref 0–40)
Albumin: 4.4 g/dL (ref 3.5–5.5)
BILIRUBIN TOTAL: 0.2 mg/dL (ref 0.0–1.2)
BUN/Creatinine Ratio: 13 (ref 9–23)
BUN: 12 mg/dL (ref 6–24)
CALCIUM: 9.4 mg/dL (ref 8.7–10.2)
CHLORIDE: 103 mmol/L (ref 96–106)
CO2: 22 mmol/L (ref 18–29)
Creatinine, Ser: 0.9 mg/dL (ref 0.57–1.00)
GFR calc Af Amer: 87 mL/min/{1.73_m2} (ref >59)
GFR calc non Af Amer: 76 mL/min/{1.73_m2} (ref >59)
Globulin, Total: 2.5 g/dL (ref 1.5–4.5)
Glucose: 71 mg/dL (ref 65–99)
POTASSIUM: 4.1 mmol/L (ref 3.5–5.2)
Sodium: 141 mmol/L (ref 134–144)
Total Protein: 6.9 g/dL (ref 6.0–8.5)

## 2015-11-05 LAB — CBC WITH DIFFERENTIAL/PLATELET
BASOS: 0 %
Basophils Absolute: 0 10*3/uL (ref 0.0–0.2)
EOS (ABSOLUTE): 0.1 10*3/uL (ref 0.0–0.4)
EOS: 1 %
HEMATOCRIT: 39.2 % (ref 34.0–46.6)
HEMOGLOBIN: 13.4 g/dL (ref 11.1–15.9)
Immature Grans (Abs): 0 10*3/uL (ref 0.0–0.1)
Immature Granulocytes: 0 %
LYMPHS ABS: 1.4 10*3/uL (ref 0.7–3.1)
Lymphs: 19 %
MCH: 31.2 pg (ref 26.6–33.0)
MCHC: 34.2 g/dL (ref 31.5–35.7)
MCV: 91 fL (ref 79–97)
MONOCYTES: 8 %
Monocytes Absolute: 0.6 10*3/uL (ref 0.1–0.9)
NEUTROS ABS: 5.1 10*3/uL (ref 1.4–7.0)
Neutrophils: 72 %
Platelets: 299 10*3/uL (ref 150–379)
RBC: 4.29 x10E6/uL (ref 3.77–5.28)
RDW: 13.9 % (ref 12.3–15.4)
WBC: 7.1 10*3/uL (ref 3.4–10.8)

## 2015-11-05 LAB — LIPID PANEL
CHOL/HDL RATIO: 1.9 ratio (ref 0.0–4.4)
Cholesterol, Total: 237 mg/dL — ABNORMAL HIGH (ref 100–199)
HDL: 125 mg/dL (ref >39)
LDL Calculated: 100 mg/dL — ABNORMAL HIGH (ref 0–99)
TRIGLYCERIDES: 60 mg/dL (ref 0–149)
VLDL Cholesterol Cal: 12 mg/dL (ref 5–40)

## 2015-11-05 LAB — VITAMIN D 25 HYDROXY (VIT D DEFICIENCY, FRACTURES): VIT D 25 HYDROXY: 22.8 ng/mL — AB (ref 30.0–100.0)

## 2015-11-05 LAB — HEP, RPR, HIV PANEL
HIV Screen 4th Generation wRfx: NONREACTIVE
Hepatitis B Surface Ag: NEGATIVE
RPR Ser Ql: NONREACTIVE

## 2015-11-05 LAB — TSH: TSH: 1.16 u[IU]/mL (ref 0.450–4.500)

## 2015-11-05 LAB — HEMOGLOBIN A1C
ESTIMATED AVERAGE GLUCOSE: 120 mg/dL
Hgb A1c MFr Bld: 5.8 % — ABNORMAL HIGH (ref 4.8–5.6)

## 2015-11-07 ENCOUNTER — Encounter: Payer: Self-pay | Admitting: Obstetrics and Gynecology

## 2015-11-07 LAB — NUSWAB VAGINITIS PLUS (VG+)
ATOPOBIUM VAGINAE: HIGH {score} — AB
BVAB 2: HIGH Score — AB
Candida albicans, NAA: NEGATIVE
Candida glabrata, NAA: NEGATIVE
Chlamydia trachomatis, NAA: NEGATIVE
Megasphaera 1: HIGH Score — AB
NEISSERIA GONORRHOEAE, NAA: NEGATIVE
Trich vag by NAA: NEGATIVE

## 2015-11-10 ENCOUNTER — Telehealth: Payer: Self-pay

## 2015-11-10 DIAGNOSIS — B9689 Other specified bacterial agents as the cause of diseases classified elsewhere: Secondary | ICD-10-CM

## 2015-11-10 DIAGNOSIS — N760 Acute vaginitis: Principal | ICD-10-CM

## 2015-11-10 MED ORDER — METRONIDAZOLE 500 MG PO TABS: 500.0000 mg | ORAL_TABLET | Freq: Two times a day (BID) | ORAL | Status: DC

## 2015-11-10 NOTE — Telephone Encounter (Signed)
Pt calls back, informed her of all the information below. RX sent in for flagyl.

## 2015-11-10 NOTE — Telephone Encounter (Signed)
-----   Message from Rubie Maid, MD sent at 11/10/2015 12:07 PM EDT ----- Please inform of annual lab abnormalities:  1) HgbA1c elevated (5.8, pre-diabetes range) 2) Elevated cholesterol (total and LDL) 3) Vitamin D insufficiency 4) Bacterial vaginosis on Nuswab, but otherwise normal STD testing.   Would recommend low carb/low cholesterol diet, and initiation of exercise (3-5 times weekly) for preventative health measures.  Also, should take a multivitamin that has Vit D, if not already taking, or can take a separate OTC Vit D supplement (600 IU) if she is already taking a daily vitamin.  Can treat BV with Flagyl or Metrogel (based on patient's preference.)

## 2015-11-10 NOTE — Telephone Encounter (Signed)
Called pt no answer. LM for pt to call back.  

## 2015-12-01 ENCOUNTER — Telehealth: Payer: Self-pay | Admitting: Obstetrics and Gynecology

## 2015-12-01 DIAGNOSIS — B9689 Other specified bacterial agents as the cause of diseases classified elsewhere: Secondary | ICD-10-CM

## 2015-12-01 DIAGNOSIS — N760 Acute vaginitis: Principal | ICD-10-CM

## 2015-12-01 DIAGNOSIS — B3731 Acute candidiasis of vulva and vagina: Secondary | ICD-10-CM

## 2015-12-01 DIAGNOSIS — B373 Candidiasis of vulva and vagina: Secondary | ICD-10-CM

## 2015-12-01 MED ORDER — FLUCONAZOLE 150 MG PO TABS: 150.0000 mg | ORAL_TABLET | Freq: Once | ORAL | Status: DC

## 2015-12-01 MED ORDER — METRONIDAZOLE 500 MG PO TABS
500.0000 mg | ORAL_TABLET | Freq: Two times a day (BID) | ORAL | Status: DC
Start: 2015-12-01 — End: 2015-12-11

## 2015-12-01 NOTE — Telephone Encounter (Signed)
Patient called stating she needs a script for a bacterial infection as well as something for a yeast infection. [facility] [address].Thanks

## 2015-12-01 NOTE — Telephone Encounter (Signed)
Done. Ashley [name]

## 2015-12-03 ENCOUNTER — Telehealth: Payer: Self-pay | Admitting: Obstetrics and Gynecology

## 2015-12-03 ENCOUNTER — Other Ambulatory Visit: Payer: Self-pay | Admitting: Otolaryngology

## 2015-12-03 DIAGNOSIS — E041 Nontoxic single thyroid nodule: Secondary | ICD-10-CM

## 2015-12-03 NOTE — Telephone Encounter (Signed)
Patient called stating she wanted to discuss a script with you. Thanks

## 2015-12-04 ENCOUNTER — Encounter: Payer: Self-pay | Admitting: Obstetrics and Gynecology

## 2015-12-04 ENCOUNTER — Ambulatory Visit (INDEPENDENT_AMBULATORY_CARE_PROVIDER_SITE_OTHER): Payer: BLUE CROSS/BLUE SHIELD | Admitting: Obstetrics and Gynecology

## 2015-12-04 VITALS — BP 125/82 | HR 87 | Ht 65.0 in | Wt 185.0 lb

## 2015-12-04 DIAGNOSIS — Z202 Contact with and (suspected) exposure to infections with a predominantly sexual mode of transmission: Secondary | ICD-10-CM | POA: Diagnosis not present

## 2015-12-04 NOTE — Telephone Encounter (Signed)
done

## 2015-12-04 NOTE — Telephone Encounter (Signed)
Called pt she states that she was possibly exposed to an STD trich, pt states that she believes her partner was exposed recently. Advised pt to come in for an appt. Pt to be seen today at 4:30pm please add to schedule. Thanks

## 2015-12-05 ENCOUNTER — Telehealth: Payer: Self-pay

## 2015-12-05 ENCOUNTER — Other Ambulatory Visit: Payer: Self-pay

## 2015-12-05 NOTE — Telephone Encounter (Signed)
Gastroenterology Pre-Procedure Review  Request Date: 12/26/15 Requesting Physician: Dr. Lavera Guise  PATIENT REVIEW QUESTIONS: The patient responded to the following health history questions as indicated:    1. Are you having any GI issues? no 2. Do you have a personal history of Polyps? no 3. Do you have a family history of Colon Cancer or Polyps? no 4. Diabetes Mellitus? no 5. Joint replacements in the past 12 months?no 6. Major health problems in the past 3 months?no 7. Any artificial heart valves, MVP, or defibrillator?no    MEDICATIONS & ALLERGIES:    Patient reports the following regarding taking any anticoagulation/antiplatelet therapy:   Plavix, Coumadin, Eliquis, Xarelto, Lovenox, Pradaxa, Brilinta, or Effient? no Aspirin? no  Patient confirms/reports the following medications:  Current Outpatient Prescriptions  Medication Sig Dispense Refill  . BIOTIN PO Take by mouth.    . cetirizine (ZYRTEC) 10 MG tablet TAKE 1 TABLET (10 MG) BY ORAL ROUTE ONCE DAILY IN THE EVENING  4  . Cholecalciferol (VITAMIN D-3 PO) Take by mouth.    . fluconazole (DIFLUCAN) 150 MG tablet Take 1 tablet (150 mg total) by mouth once. 1 tablet 2  . IRON PO Take by mouth.    . metroNIDAZOLE (FLAGYL) 500 MG tablet Take 1 tablet (500 mg total) by mouth 2 (two) times daily. 14 tablet 2  . norethindrone (MICRONOR,CAMILA,ERRIN) 0.35 MG tablet Take 1 tablet (0.35 mg total) by mouth daily. 1 Package 11   No current facility-administered medications for this visit.    Patient confirms/reports the following allergies:  No Known Allergies  No orders of the defined types were placed in this encounter.    AUTHORIZATION INFORMATION Primary Insurance: 1D#: Group #:  Secondary Insurance: 1D#: Group #:  SCHEDULE INFORMATION: Date: 12/26/15 Time: Location: [place]

## 2015-12-07 NOTE — Progress Notes (Signed)
    GYNECOLOGY PROGRESS NOTE  Subjective:    Patient ID: Ashley [name], female    DOB: December 27, 1965, 50 y.o.   MRN: [id number]  HPI  Patient is a 50 y.o. G81P1001 female who presents for sexually transmitted disease check. Sexual history reviewed with the patient. STD exposure: sexual contact 1 week ago with individual thought to have trichomonas.  Previous history of STD:  none. Current symptoms include none.  Contraception: tubal ligation.  The following portions of the patient's history were reviewed and updated as appropriate: allergies, current medications, past family history, past medical history, past social history, past surgical history and problem list.  Review of Systems A comprehensive review of systems was negative.   Objective:   Blood pressure 125/82, pulse 87, height 5\' 5"  (1.651 m), weight 185 lb (83.915 kg), last menstrual period 10/16/2015. General appearance: alert Abdomen: soft, non-tender; bowel sounds normal; no masses,  no organomegaly Pelvic: external genitalia normal, rectovaginal septum normal.  Vagina with scant white thin discharge, no odor. Cervix normal appearing, no lesions and no motion tenderness.  Uterus mobile, nontender, normal shape and size.  Adnexae non-palpable, nontender bilaterally.    Microscopic wet-mount exam shows few clue cells.  No hyphae, trichomonads, or white blood cells.  Assessment:   Exposure to STD  Plan:   Wet prep negative today, Nuswab collected.  Counseled on safe sex practices.  Will notify patient by phone of results.    Rubie Maid, MD [facility]  8

## 2015-12-08 LAB — NUSWAB VAGINITIS PLUS (VG+)
CANDIDA ALBICANS, NAA: NEGATIVE
CANDIDA GLABRATA, NAA: NEGATIVE
Chlamydia trachomatis, NAA: NEGATIVE
NEISSERIA GONORRHOEAE, NAA: NEGATIVE
Trich vag by NAA: NEGATIVE

## 2015-12-11 ENCOUNTER — Telehealth: Payer: Self-pay

## 2015-12-11 DIAGNOSIS — B9689 Other specified bacterial agents as the cause of diseases classified elsewhere: Secondary | ICD-10-CM

## 2015-12-11 DIAGNOSIS — B373 Candidiasis of vulva and vagina: Secondary | ICD-10-CM

## 2015-12-11 DIAGNOSIS — B3731 Acute candidiasis of vulva and vagina: Secondary | ICD-10-CM

## 2015-12-11 DIAGNOSIS — N760 Acute vaginitis: Principal | ICD-10-CM

## 2015-12-11 MED ORDER — METRONIDAZOLE 500 MG PO TABS
500.0000 mg | ORAL_TABLET | Freq: Two times a day (BID) | ORAL | Status: DC
Start: 2015-12-11 — End: 2016-02-05

## 2015-12-11 NOTE — Telephone Encounter (Signed)
Called pt informed her of results below. RX sent in for flagyl.

## 2015-12-11 NOTE — Telephone Encounter (Signed)
-----   Message from Rubie Maid, MD sent at 12/09/2015  6:45 PM EDT ----- Please inform of BV infection, negative for STD.  Needs treatment with Flagyl or Metrogel.

## 2015-12-24 NOTE — Discharge Instructions (Signed)

## 2015-12-26 ENCOUNTER — Ambulatory Visit: Payer: BLUE CROSS/BLUE SHIELD | Admitting: Anesthesiology

## 2015-12-26 ENCOUNTER — Ambulatory Visit
Admission: RE | Admit: 2015-12-26 | Discharge: 2015-12-26 | Disposition: A | Discharge status: Home / Self Care | Payer: BLUE CROSS/BLUE SHIELD | Source: Ambulatory Visit | Attending: Gastroenterology | Admitting: Gastroenterology

## 2015-12-26 ENCOUNTER — Encounter
Admission: RE | Disposition: A | Discharge status: Home / Self Care | Payer: Self-pay | Source: Ambulatory Visit | Attending: Gastroenterology

## 2015-12-26 DIAGNOSIS — Z1211 Encounter for screening for malignant neoplasm of colon: Secondary | ICD-10-CM | POA: Diagnosis not present

## 2015-12-26 DIAGNOSIS — Z8489 Family history of other specified conditions: Secondary | ICD-10-CM | POA: Diagnosis not present

## 2015-12-26 DIAGNOSIS — Z79899 Other long term (current) drug therapy: Secondary | ICD-10-CM | POA: Insufficient documentation

## 2015-12-26 DIAGNOSIS — E049 Nontoxic goiter, unspecified: Secondary | ICD-10-CM | POA: Diagnosis not present

## 2015-12-26 DIAGNOSIS — Z8249 Family history of ischemic heart disease and other diseases of the circulatory system: Secondary | ICD-10-CM | POA: Insufficient documentation

## 2015-12-26 DIAGNOSIS — K641 Second degree hemorrhoids: Secondary | ICD-10-CM | POA: Diagnosis not present

## 2015-12-26 DIAGNOSIS — Z833 Family history of diabetes mellitus: Secondary | ICD-10-CM | POA: Diagnosis not present

## 2015-12-26 HISTORY — PX: COLONOSCOPY WITH PROPOFOL: SHX5780

## 2015-12-26 SURGERY — COLONOSCOPY WITH PROPOFOL
Anesthesia: Monitor Anesthesia Care | Wound class: Contaminated

## 2015-12-26 MED ORDER — LACTATED RINGERS IV SOLN
INTRAVENOUS | Status: DC
  Administered 2015-12-26: 08:00:00 via INTRAVENOUS

## 2015-12-26 MED ORDER — STERILE WATER FOR IRRIGATION IR SOLN
Status: DC | PRN
  Administered 2015-12-26: 09:00:00

## 2015-12-26 MED ORDER — LIDOCAINE HCL (CARDIAC) 20 MG/ML IV SOLN
INTRAVENOUS | Status: DC | PRN
  Administered 2015-12-26: 40 mg via INTRAVENOUS

## 2015-12-26 MED ORDER — PROPOFOL 10 MG/ML IV BOLUS
INTRAVENOUS | Status: DC | PRN
  Administered 2015-12-26: 20 mg via INTRAVENOUS
  Administered 2015-12-26 (×3): 50 mg via INTRAVENOUS
  Administered 2015-12-26: 20 mg via INTRAVENOUS
  Administered 2015-12-26: 50 mg via INTRAVENOUS

## 2015-12-26 SURGICAL SUPPLY — 23 items
CANISTER SUCT 1200ML W/VALVE (MISCELLANEOUS) ×2 IMPLANT
CLIP HMST 235XBRD CATH ROT (MISCELLANEOUS) IMPLANT
CLIP RESOLUTION 360 11X235 (MISCELLANEOUS)
FCP ESCP3.2XJMB 240X2.8X (MISCELLANEOUS)
FORCEPS BIOP RAD 4 LRG CAP 4 (CUTTING FORCEPS) IMPLANT
FORCEPS BIOP RJ4 240 W/NDL (MISCELLANEOUS)
FORCEPS ESCP3.2XJMB 240X2.8X (MISCELLANEOUS) IMPLANT
GOWN CVR UNV OPN BCK APRN NK (MISCELLANEOUS) ×2 IMPLANT
GOWN ISOL THUMB LOOP REG UNIV (MISCELLANEOUS) ×2
INJECTOR VARIJECT VIN23 (MISCELLANEOUS) IMPLANT
KIT DEFENDO VALVE AND CONN (KITS) IMPLANT
KIT ENDO PROCEDURE OLY (KITS) ×2 IMPLANT
MARKER SPOT ENDO TATTOO 5ML (MISCELLANEOUS) IMPLANT
PAD GROUND ADULT SPLIT (MISCELLANEOUS) IMPLANT
PROBE APC STR FIRE (PROBE) IMPLANT
RETRIEVER NET ROTH 2.5X230 LF (MISCELLANEOUS) ×2 IMPLANT
SNARE SHORT THROW 13M SML OVAL (MISCELLANEOUS) IMPLANT
SNARE SHORT THROW 30M LRG OVAL (MISCELLANEOUS) IMPLANT
SNARE SNG USE RND 15MM (INSTRUMENTS) IMPLANT
SPOT EX ENDOSCOPIC TATTOO (MISCELLANEOUS)
TRAP ETRAP POLY (MISCELLANEOUS) IMPLANT
VARIJECT INJECTOR VIN23 (MISCELLANEOUS)
WATER STERILE IRR 250ML POUR (IV SOLUTION) ×2 IMPLANT

## 2015-12-26 NOTE — H&P (Signed)
  Lucilla Lame, MD [facility] [address], [place], [place] [address] Phone: [phone] Fax : [phone]  Primary Care Physician:  Cletis Athens, MD Primary Gastroenterologist:  Dr. Allen Norris  Pre-Procedure History & Physical: HPI:  Ashley [name] is a 50 y.o. female is here for a screening colonoscopy.   Past Medical History  Diagnosis Date  . Thyroid enlarged     pt reports "being watched"    Past Surgical History  Procedure Laterality Date  . Tonsillectomy    . Tubal ligation    . Excision mass neck Right 05/27/2015    Procedure: EXCISION MASS NECK RIGHT LIPOMA;  Surgeon: Clyde Canterbury, MD;  Location: [place];  Service: ENT;  Laterality: Right;    Prior to Admission medications   Medication Sig Start Date End Date Taking? Authorizing Provider  BIOTIN PO Take by mouth.   Yes Historical Provider, MD  cetirizine (ZYRTEC) 10 MG tablet TAKE 1 TABLET (10 MG) BY ORAL ROUTE ONCE DAILY IN THE EVENING 10/24/15  Yes Historical Provider, MD  Cholecalciferol (VITAMIN D-3 PO) Take by mouth.   Yes Historical Provider, MD  IRON PO Take by mouth.   Yes Historical Provider, MD  norethindrone (MICRONOR,CAMILA,ERRIN) 0.35 MG tablet Take 1 tablet (0.35 mg total) by mouth daily. 11/04/15  Yes Rubie Maid, MD  fluconazole (DIFLUCAN) 150 MG tablet Take 1 tablet (150 mg total) by mouth once. Patient not taking: Reported on 12/05/2015 12/01/15   Rubie Maid, MD  metroNIDAZOLE (FLAGYL) 500 MG tablet Take 1 tablet (500 mg total) by mouth 2 (two) times daily. Patient not taking: Reported on 12/22/2015 12/11/15   Rubie Maid, MD    Allergies as of 12/05/2015  . (No Known Allergies)    Family History  Problem Relation Age of Onset  . Hypertension Mother   . Hypertension Father   . Diabetes Father     Social History   Social History  . Marital Status: Single    Spouse Name: N/A  . Number of Children: N/A  . Years of Education: N/A   Occupational History  . Not on file.    Social History Main Topics  . Smoking status: Never Smoker   . Smokeless tobacco: Not on file  . Alcohol Use: No  . Drug Use: No  . Sexual Activity: Yes    Birth Control/ Protection: None   Other Topics Concern  . Not on file   Social History Narrative    Review of Systems: See HPI, otherwise negative ROS  Physical Exam: BP 121/84 mmHg  Pulse 87  Temp(Src) 98.1 F (36.7 C) (Temporal)  Wt 184 lb (83.462 kg)  SpO2 100%  LMP 12/08/2015 General:   Alert,  pleasant and cooperative in NAD Head:  Normocephalic and atraumatic. Neck:  Supple; no masses or thyromegaly. Lungs:  Clear throughout to auscultation.    Heart:  Regular rate and rhythm. Abdomen:  Soft, nontender and nondistended. Normal bowel sounds, without guarding, and without rebound.   Neurologic:  Alert and  oriented x4;  grossly normal neurologically.  Impression/Plan: Ashley [name] is now here to undergo a screening colonoscopy.  Risks, benefits, and alternatives regarding colonoscopy have been reviewed with the patient.  Questions have been answered.  All parties agreeable.

## 2015-12-26 NOTE — Op Note (Signed)
[facility] Patient Name: Ashley [name] Procedure Date: 12/26/2015 8:33 AM MRN: [id number] Account #: [id number] Date of Birth: Dec 27, 1965 Admit Type: Outpatient Age: 50 Room: [facility] OR ROOM 01 Gender: Female Note Status: Finalized Procedure:            Colonoscopy Indications:          Screening for colorectal malignant neoplasm Providers:            Lucilla Lame, MD Referring MD:         Cletis Athens, MD (Referring MD) Medicines:            Propofol per Anesthesia Complications:        No immediate complications. Procedure:            Pre-Anesthesia Assessment:                       - Prior to the procedure, a History and Physical was                        performed, and patient medications and allergies were                        reviewed. The patient's tolerance of previous                        anesthesia was also reviewed. The risks and benefits of                        the procedure and the sedation options and risks were                        discussed with the patient. All questions were                        answered, and informed consent was obtained. Prior                        Anticoagulants: The patient has taken no previous                        anticoagulant or antiplatelet agents. ASA Grade                        Assessment: II - A patient with mild systemic disease.                        After reviewing the risks and benefits, the patient was                        deemed in satisfactory condition to undergo the                        procedure.                       After obtaining informed consent, the colonoscope was                        passed under direct vision. Throughout the procedure,  the patient's blood pressure, pulse, and oxygen                        saturations were monitored continuously. The Olympus                        CF-HQ190L Colonoscope (S#. [id number]) was introduced             through the anus and advanced to the the cecum,                        identified by appendiceal orifice and ileocecal valve.                        The colonoscopy was performed without difficulty. The                        patient tolerated the procedure well. The quality of                        the bowel preparation was excellent. Findings:      The perianal and digital rectal examinations were normal.      Internal hemorrhoids were found during retroflexion. The hemorrhoids       were Grade II (internal hemorrhoids that prolapse but reduce       spontaneously). Impression:           - Internal hemorrhoids.                       - No specimens collected. Recommendation:       - Repeat colonoscopy in 10 years for screening unless                        any change in family history or lower GI problems. Procedure Code(s):    --- Professional ---                       [id number], Colonoscopy, flexible; diagnostic, including                        collection of specimen(s) by brushing or washing, when                        performed (separate procedure) Diagnosis Code(s):    --- Professional ---                       Z12.11, Encounter for screening for malignant neoplasm                        of colon CPT copyright 2016 American Medical Association. All rights reserved. The codes documented in this report are preliminary and upon coder review may  be revised to meet current compliance requirements. Lucilla Lame, MD 12/26/2015 8:52:22 AM This report has been signed electronically. Number of Addenda: 0 Note Initiated On: 12/26/2015 8:33 AM Scope Withdrawal Time: 0 hours 6 minutes 32 seconds  Total Procedure Duration: 0 hours 10 minutes 20 seconds       [facility]

## 2015-12-26 NOTE — Anesthesia Postprocedure Evaluation (Signed)
Anesthesia Post Note  Patient: Ashley [name]  Procedure(s) Performed: Procedure(s) (LRB): COLONOSCOPY WITH PROPOFOL (N/A)  Patient location during evaluation: PACU Anesthesia Type: MAC Level of consciousness: awake and alert and oriented Pain management: satisfactory to patient Vital Signs Assessment: post-procedure vital signs reviewed and stable Respiratory status: spontaneous breathing, nonlabored ventilation and respiratory function stable Cardiovascular status: blood pressure returned to baseline and stable Postop Assessment: Adequate PO intake and No signs of nausea or vomiting Anesthetic complications: no    Raliegh Ip

## 2015-12-26 NOTE — Anesthesia Preprocedure Evaluation (Signed)
Anesthesia Evaluation  Patient identified by MRN, date of birth, ID band  Reviewed: Allergy & Precautions, H&P , NPO status , Patient's Chart, lab work & pertinent test results  Airway Mallampati: III  TM Distance: >3 FB Neck ROM: full    Dental no notable dental hx.    Pulmonary    Pulmonary exam normal        Cardiovascular  Rhythm:regular Rate:Normal     Neuro/Psych    GI/Hepatic   Endo/Other    Renal/GU      Musculoskeletal   Abdominal   Peds  Hematology   Anesthesia Other Findings   Reproductive/Obstetrics                             Anesthesia Physical Anesthesia Plan  ASA: I  Anesthesia Plan: MAC   Post-op Pain Management:    Induction:   Airway Management Planned:   Additional Equipment:   Intra-op Plan:   Post-operative Plan:   Informed Consent: I have reviewed the patients History and Physical, chart, labs and discussed the procedure including the risks, benefits and alternatives for the proposed anesthesia with the patient or authorized representative who has indicated his/her understanding and acceptance.     Plan Discussed with: CRNA  Anesthesia Plan Comments:         Anesthesia Quick Evaluation

## 2015-12-26 NOTE — Transfer of Care (Signed)
Immediate Anesthesia Transfer of Care Note  Patient: Ashley [name]  Procedure(s) Performed: Procedure(s): COLONOSCOPY WITH PROPOFOL (N/A)  Patient Location: PACU  Anesthesia Type: MAC  Level of Consciousness: awake, alert  and patient cooperative  Airway and Oxygen Therapy: Patient Spontanous Breathing and Patient connected to supplemental oxygen  Post-op Assessment: Post-op Vital signs reviewed, Patient's Cardiovascular Status Stable, Respiratory Function Stable, Patent Airway and No signs of Nausea or vomiting  Post-op Vital Signs: Reviewed and stable  Complications: No apparent anesthesia complications

## 2016-02-05 ENCOUNTER — Ambulatory Visit (INDEPENDENT_AMBULATORY_CARE_PROVIDER_SITE_OTHER): Payer: BLUE CROSS/BLUE SHIELD | Admitting: Obstetrics and Gynecology

## 2016-02-05 ENCOUNTER — Encounter: Payer: Self-pay | Admitting: Obstetrics and Gynecology

## 2016-02-05 VITALS — BP 121/86 | HR 89 | Ht 65.0 in | Wt 188.2 lb

## 2016-02-05 DIAGNOSIS — N943 Premenstrual tension syndrome: Secondary | ICD-10-CM | POA: Diagnosis not present

## 2016-02-05 DIAGNOSIS — G43829 Menstrual migraine, not intractable, without status migrainosus: Secondary | ICD-10-CM | POA: Diagnosis not present

## 2016-02-05 DIAGNOSIS — N949 Unspecified condition associated with female genital organs and menstrual cycle: Secondary | ICD-10-CM | POA: Diagnosis not present

## 2016-02-05 NOTE — Progress Notes (Signed)
    GYNECOLOGY PROGRESS NOTE  Subjective:    Patient ID: Ashley [name], female    DOB: 09-Nov-1965, 50 y.o.   MRN: [id number]  HPI  Patient is a 50 y.o. G66P1001 female who presents for medication check.  Was initiated on minipill for premenstrual symptoms (including nausea, and menstrual migraines). Notes menstrual migraines and nausea have resolved.  Periods have gotten a little longer (4-5 days, vs 2-4 days) and a slightly heavier.   The following portions of the patient's history were reviewed and updated as appropriate: allergies, current medications, past family history, past medical history, past social history, past surgical history and problem list.  Review of Systems Pertinent items noted in HPI and remainder of comprehensive ROS otherwise negative.   Objective:   Blood pressure 121/86, pulse 89, height 5\' 5"  (1.651 m), weight 188 lb 3.2 oz (85.4 kg), last menstrual period 12/29/2015. General appearance: alert and no distress Exam deferred.   Assessment:   Premenstrual symptoms with menstrual migraines  Plan:   Patient reports that pills are working to help relieve premenstrual symptoms, however cycles are a little bit more bothersome (slightly longer and heavier). Also would like to not have to take something daily. Discussed alternative options including Depo-Provera, Mirena IUD, or Nexplanon (recommending progesterone-only methods as patient over age of 65).  After reviewing options, patient may try Mirena IUD for longer-term managment.  Patient given handout on Mirena IUD, to contact the office to schedule an appointment if IUD placement is desired.   A total of 15 minutes were spent face-to-face with the patient during this encounter and over half of that time dealt with counseling and coordination of care.

## 2016-02-06 DIAGNOSIS — G43829 Menstrual migraine, not intractable, without status migrainosus: Secondary | ICD-10-CM | POA: Insufficient documentation

## 2016-02-06 DIAGNOSIS — N943 Premenstrual tension syndrome: Secondary | ICD-10-CM | POA: Insufficient documentation

## 2016-04-22 ENCOUNTER — Telehealth: Payer: Self-pay | Admitting: Obstetrics and Gynecology

## 2016-04-22 DIAGNOSIS — B9689 Other specified bacterial agents as the cause of diseases classified elsewhere: Secondary | ICD-10-CM

## 2016-04-22 DIAGNOSIS — N760 Acute vaginitis: Principal | ICD-10-CM

## 2016-04-22 MED ORDER — METRONIDAZOLE 0.75 % VA GEL: 1.0000 | Freq: Two times a day (BID) | VAGINAL | 0 refills | Status: DC

## 2016-04-22 NOTE — Telephone Encounter (Signed)
PT SAID SHE HAS A BACTERIAL INFECTION AND WOULD LIKE SOMETHING SENT TO THE PHARMACY ([facility]

## 2016-04-22 NOTE — Telephone Encounter (Signed)
Done. Pt will need to make an appointment next time for recurrent BV.

## 2016-06-03 ENCOUNTER — Ambulatory Visit: Payer: BLUE CROSS/BLUE SHIELD | Attending: Otolaryngology

## 2016-06-23 ENCOUNTER — Ambulatory Visit
Admission: RE | Admit: 2016-06-23 | Discharge: 2016-06-23 | Disposition: A | Discharge status: Home / Self Care | Payer: BLUE CROSS/BLUE SHIELD | Source: Ambulatory Visit | Attending: Otolaryngology | Admitting: Otolaryngology

## 2016-06-23 DIAGNOSIS — E041 Nontoxic single thyroid nodule: Secondary | ICD-10-CM | POA: Insufficient documentation

## 2016-09-20 DIAGNOSIS — R040 Epistaxis: Secondary | ICD-10-CM | POA: Diagnosis not present

## 2016-11-01 DIAGNOSIS — Z1231 Encounter for screening mammogram for malignant neoplasm of breast: Secondary | ICD-10-CM | POA: Diagnosis not present

## 2016-12-27 NOTE — Progress Notes (Deleted)
GYNECOLOGY ANNUAL PHYSICAL EXAM PROGRESS NOTE  Subjective:    Ashley [name] is a 51 y.o. G44P1001 female who presents for an annual exam. The patient has no complaints today. The patient {is/is not/has never been:13135} sexually active. GYN screening history: {gyn screen history:13142}. The patient wears seatbelts: {yes/no:311178}. The patient participates in regular exercise: {yes/no/not asked:9010}. Has the patient ever been transfused or tattooed?: {yes/no/not asked:9010}. The patient reports that there {is/is not:9024} domestic violence in her life.    Gynecologic History No LMP recorded. Menstrual History: OB History    Gravida Para Term Preterm AB Living   1 1 1     1    SAB TAB Ectopic Multiple Live Births           1      Menarche age: 35 No LMP recorded.    Contraception: tubal ligation History of STI's:  Last Pap: 20163. Results were: Normal.Denies h/o abnormal pap smears. Last mammogram:  Results were: {norm/abn:16337}   Obstetric History   G1   P1   T1   P0   A0   L1    SAB0   TAB0   Ectopic0   Multiple0   Live Births1     # Outcome Date GA Lbr Len/2nd Weight Sex Delivery Anes PTL Lv  1 Term 1992 [redacted]w[redacted]d  6 lb 7 oz (2.92 kg) F Vag-Spont   LIV      Past Medical History:  Diagnosis Date  . Thyroid enlarged    pt reports "being watched"    Past Surgical History:  Procedure Laterality Date  . COLONOSCOPY WITH PROPOFOL N/A 12/26/2015   Procedure: COLONOSCOPY WITH PROPOFOL;  Surgeon: Lucilla Lame, MD;  Location: [place];  Service: Endoscopy;  Laterality: N/A;  . EXCISION MASS NECK Right 05/27/2015   Procedure: EXCISION MASS NECK RIGHT LIPOMA;  Surgeon: Clyde Canterbury, MD;  Location: [place];  Service: ENT;  Laterality: Right;  . TONSILLECTOMY    . TUBAL LIGATION      Family History  Problem Relation Age of Onset  . Hypertension Mother   . Hypertension Father   . Diabetes Father     Social History   Social History  .  Marital status: Single    Spouse name: N/A  . Number of children: N/A  . Years of education: N/A   Occupational History  . Not on file.   Social History Main Topics  . Smoking status: Never Smoker  . Smokeless tobacco: Never Used  . Alcohol use No  . Drug use: No  . Sexual activity: Yes    Birth control/ protection: Surgical   Other Topics Concern  . Not on file   Social History Narrative  . No narrative on file    Current Outpatient Prescriptions on File Prior to Visit  Medication Sig Dispense Refill  . BIOTIN PO Take by mouth.    . cetirizine (ZYRTEC) 10 MG tablet TAKE 1 TABLET (10 MG) BY ORAL ROUTE ONCE DAILY IN THE EVENING  4  . Cholecalciferol (VITAMIN D-3 PO) Take by mouth.    . IRON PO Take by mouth.    . metroNIDAZOLE (METROGEL VAGINAL) 0.75 % vaginal gel Place 1 Applicatorful vaginally 2 (two) times daily. 70 g 0  . norethindrone (MICRONOR,CAMILA,ERRIN) 0.35 MG tablet Take 1 tablet (0.35 mg total) by mouth daily. 1 Package 11   No current facility-administered medications on file prior to visit.     No Known Allergies  Review of Systems Constitutional: negative for chills, fatigue, fevers and sweats Eyes: negative for irritation, redness and visual disturbance Ears, nose, mouth, throat, and face: negative for hearing loss, nasal congestion, snoring and tinnitus Respiratory: negative for asthma, cough, sputum Cardiovascular: negative for chest pain, dyspnea, exertional chest pressure/discomfort, irregular heart beat, palpitations and syncope Gastrointestinal: negative for abdominal pain, change in bowel habits, nausea and vomiting Genitourinary: negative for abnormal menstrual periods, genital lesions, sexual problems and vaginal discharge, dysuria and urinary incontinence Integument/breast: negative for breast lump, breast tenderness and nipple discharge Hematologic/lymphatic: negative for bleeding and easy bruising Musculoskeletal:negative for back pain  and muscle weakness Neurological: negative for dizziness, headaches, vertigo and weakness Endocrine: negative for diabetic symptoms including polydipsia, polyuria and skin dryness Allergic/Immunologic: negative for hay fever and urticaria        Objective:  There were no vitals taken for this visit. There is no height or weight on file to calculate BMI.     General Appearance:    Alert, cooperative, no distress, appears stated age  Head:    Normocephalic, without obvious abnormality, atraumatic  Eyes:    PERRL, conjunctiva/corneas clear, EOM's intact, both eyes  Ears:    Normal external ear canals, both ears  Nose:   Nares normal, septum midline, mucosa normal, no drainage or sinus tenderness  Throat:   Lips, mucosa, and tongue normal; teeth and gums normal  Neck:   Supple, symmetrical, trachea midline, no adenopathy; thyroid: no enlargement/tenderness/nodules; no carotid bruit or JVD  Back:     Symmetric, no curvature, ROM normal, no CVA tenderness  Lungs:     Clear to auscultation bilaterally, respirations unlabored  Chest Wall:    No tenderness or deformity   Heart:    Regular rate and rhythm, S1 and S2 normal, no murmur, rub or gallop  Breast Exam:    No tenderness, masses, or nipple abnormality  Abdomen:     Soft, non-tender, bowel sounds active all four quadrants, no masses, no organomegaly.    Genitalia:    Pelvic:external genitalia normal, vagina without lesions, discharge, or tenderness, rectovaginal septum  normal. Cervix normal in appearance, no cervical motion tenderness, no adnexal masses or tenderness.  Uterus normal size, shape, mobile, regular contours, nontender.  Rectal:    Normal external sphincter.  No hemorrhoids appreciated. Internal exam not done.   Extremities:   Extremities normal, atraumatic, no cyanosis or edema  Pulses:   2+ and symmetric all extremities  Skin:   Skin color, texture, turgor normal, no rashes or lesions  Lymph nodes:   Cervical,  supraclavicular, and axillary nodes normal  Neurologic:   CNII-XII intact, normal strength, sensation and reflexes throughout   .  Labs:  Lab Results  Component Value Date   WBC 7.1 11/04/2015   HGB 13.4 11/04/2015   HCT 39.2 11/04/2015   MCV 91 11/04/2015   PLT 299 11/04/2015    Lab Results  Component Value Date   CREATININE 0.90 11/04/2015   BUN 12 11/04/2015   NA 141 11/04/2015   K 4.1 11/04/2015   CL 103 11/04/2015   CO2 22 11/04/2015    Lab Results  Component Value Date   ALT 13 11/04/2015   AST 16 11/04/2015   ALKPHOS 59 11/04/2015   BILITOT 0.2 11/04/2015    Lab Results  Component Value Date   TSH 1.160 11/04/2015     Assessment:    Healthy female exam.    Plan:     {gyn plan:13146}  Gordy Clement, [facility]

## 2016-12-28 ENCOUNTER — Ambulatory Visit (INDEPENDENT_AMBULATORY_CARE_PROVIDER_SITE_OTHER): Payer: BLUE CROSS/BLUE SHIELD | Admitting: Obstetrics and Gynecology

## 2016-12-28 ENCOUNTER — Encounter: Payer: Self-pay | Admitting: Obstetrics and Gynecology

## 2016-12-28 VITALS — BP 118/73 | HR 103 | Ht 65.0 in | Wt 204.1 lb

## 2016-12-28 DIAGNOSIS — Z01419 Encounter for gynecological examination (general) (routine) without abnormal findings: Secondary | ICD-10-CM

## 2016-12-28 DIAGNOSIS — E669 Obesity, unspecified: Secondary | ICD-10-CM

## 2016-12-28 NOTE — Patient Instructions (Addendum)
Health Maintenance, Female Adopting a healthy lifestyle and getting preventive care can go a long way to promote health and wellness. Talk with your health care provider about what schedule of regular examinations is right for you. This is a good chance for you to check in with your provider about disease prevention and staying healthy. In between checkups, there are plenty of things you can do on your own. Experts have done a lot of research about which lifestyle changes and preventive measures are most likely to keep you healthy. Ask your health care provider for more information. Weight and diet Eat a healthy diet  Be sure to include plenty of vegetables, fruits, low-fat dairy products, and lean protein.  Do not eat a lot of foods high in solid fats, added sugars, or salt.  Get regular exercise. This is one of the most important things you can do for your health. ? Most adults should exercise for at least 150 minutes each week. The exercise should increase your heart rate and make you sweat (moderate-intensity exercise). ? Most adults should also do strengthening exercises at least twice a week. This is in addition to the moderate-intensity exercise.  Maintain a healthy weight  Body mass index (BMI) is a measurement that can be used to identify possible weight problems. It estimates body fat based on height and weight. Your health care provider can help determine your BMI and help you achieve or maintain a healthy weight.  For females 20 years of age and older: ? A BMI below 18.5 is considered underweight. ? A BMI of 18.5 to 24.9 is normal. ? A BMI of 25 to 29.9 is considered overweight. ? A BMI of 30 and above is considered obese.  Watch levels of cholesterol and blood lipids  You should start having your blood tested for lipids and cholesterol at 50 years of age, then have this test every 5 years.  You may need to have your cholesterol levels checked more often if: ? Your lipid or  cholesterol levels are high. ? You are older than 50 years of age. ? You are at high risk for heart disease.  Cancer screening Lung Cancer  Lung cancer screening is recommended for adults 55-80 years old who are at high risk for lung cancer because of a history of smoking.  A yearly low-dose CT scan of the lungs is recommended for people who: ? Currently smoke. ? Have quit within the past 15 years. ? Have at least a 30-pack-year history of smoking. A pack year is smoking an average of one pack of cigarettes a day for 1 year.  Yearly screening should continue until it has been 15 years since you quit.  Yearly screening should stop if you develop a health problem that would prevent you from having lung cancer treatment.  Breast Cancer  Practice breast self-awareness. This means understanding how your breasts normally appear and feel.  It also means doing regular breast self-exams. Let your health care provider know about any changes, no matter how small.  If you are in your 20s or 30s, you should have a clinical breast exam (CBE) by a health care provider every 1-3 years as part of a regular health exam.  If you are 40 or older, have a CBE every year. Also consider having a breast X-ray (mammogram) every year.  If you have a family history of breast cancer, talk to your health care provider about genetic screening.  If you are at high risk   for breast cancer, talk to your health care provider about having an MRI and a mammogram every year.  Breast cancer gene (BRCA) assessment is recommended for women who have family members with BRCA-related cancers. BRCA-related cancers include: ? Breast. ? Ovarian. ? Tubal. ? Peritoneal cancers.  Results of the assessment will determine the need for genetic counseling and BRCA1 and BRCA2 testing.  Cervical Cancer Your health care provider may recommend that you be screened regularly for cancer of the pelvic organs (ovaries, uterus, and  vagina). This screening involves a pelvic examination, including checking for microscopic changes to the surface of your cervix (Pap test). You may be encouraged to have this screening done every 3 years, beginning at age 22.  For women ages 56-65, health care providers may recommend pelvic exams and Pap testing every 3 years, or they may recommend the Pap and pelvic exam, combined with testing for human papilloma virus (HPV), every 5 years. Some types of HPV increase your risk of cervical cancer. Testing for HPV may also be done on women of any age with unclear Pap test results.  Other health care providers may not recommend any screening for nonpregnant women who are considered low risk for pelvic cancer and who do not have symptoms. Ask your health care provider if a screening pelvic exam is right for you.  If you have had past treatment for cervical cancer or a condition that could lead to cancer, you need Pap tests and screening for cancer for at least 20 years after your treatment. If Pap tests have been discontinued, your risk factors (such as having a new sexual partner) need to be reassessed to determine if screening should resume. Some women have medical problems that increase the chance of getting cervical cancer. In these cases, your health care provider may recommend more frequent screening and Pap tests.  Colorectal Cancer  This type of cancer can be detected and often prevented.  Routine colorectal cancer screening usually begins at 50 years of age and continues through 50 years of age.  Your health care provider may recommend screening at an earlier age if you have risk factors for colon cancer.  Your health care provider may also recommend using home test kits to check for hidden blood in the stool.  A small camera at the end of a tube can be used to examine your colon directly (sigmoidoscopy or colonoscopy). This is done to check for the earliest forms of colorectal  cancer.  Routine screening usually begins at age 33.  Direct examination of the colon should be repeated every 5-10 years through 50 years of age. However, you may need to be screened more often if early forms of precancerous polyps or small growths are found.  Skin Cancer  Check your skin from head to toe regularly.  Tell your health care provider about any new moles or changes in moles, especially if there is a change in a mole's shape or color.  Also tell your health care provider if you have a mole that is larger than the size of a pencil eraser.  Always use sunscreen. Apply sunscreen liberally and repeatedly throughout the day.  Protect yourself by wearing long sleeves, pants, a wide-brimmed hat, and sunglasses whenever you are outside.  Heart disease, diabetes, and high blood pressure  High blood pressure causes heart disease and increases the risk of stroke. High blood pressure is more likely to develop in: ? People who have blood pressure in the high end of  the normal range (130-139/85-89 mm Hg). ? People who are overweight or obese. ? People who are African American.  If you are 21-29 years of age, have your blood pressure checked every 3-5 years. If you are 3 years of age or older, have your blood pressure checked every year. You should have your blood pressure measured twice-once when you are at a hospital or clinic, and once when you are not at a hospital or clinic. Record the average of the two measurements. To check your blood pressure when you are not at a hospital or clinic, you can use: ? An automated blood pressure machine at a pharmacy. ? A home blood pressure monitor.  If you are between 17 years and 37 years old, ask your health care provider if you should take aspirin to prevent strokes.  Have regular diabetes screenings. This involves taking a blood sample to check your fasting blood sugar level. ? If you are at a normal weight and have a low risk for diabetes,  have this test once every three years after 50 years of age. ? If you are overweight and have a high risk for diabetes, consider being tested at a younger age or more often. Preventing infection Hepatitis B  If you have a higher risk for hepatitis B, you should be screened for this virus. You are considered at high risk for hepatitis B if: ? You were born in a country where hepatitis B is common. Ask your health care provider which countries are considered high risk. ? Your parents were born in a high-risk country, and you have not been immunized against hepatitis B (hepatitis B vaccine). ? You have HIV or AIDS. ? You use needles to inject street drugs. ? You live with someone who has hepatitis B. ? You have had sex with someone who has hepatitis B. ? You get hemodialysis treatment. ? You take certain medicines for conditions, including cancer, organ transplantation, and autoimmune conditions.  Hepatitis C  Blood testing is recommended for: ? Everyone born from [birth year] through 1964. ? Anyone with known risk factors for hepatitis C.  Sexually transmitted infections (STIs)  You should be screened for sexually transmitted infections (STIs) including gonorrhea and chlamydia if: ? You are sexually active and are younger than 50 years of age. ? You are older than 50 years of age and your health care provider tells you that you are at risk for this type of infection. ? Your sexual activity has changed since you were last screened and you are at an increased risk for chlamydia or gonorrhea. Ask your health care provider if you are at risk.  If you do not have HIV, but are at risk, it may be recommended that you take a prescription medicine daily to prevent HIV infection. This is called pre-exposure prophylaxis (PrEP). You are considered at risk if: ? You are sexually active and do not regularly use condoms or know the HIV status of your partner(s). ? You take drugs by injection. ? You are  sexually active with a partner who has HIV.  Talk with your health care provider about whether you are at high risk of being infected with HIV. If you choose to begin PrEP, you should first be tested for HIV. You should then be tested every 3 months for as long as you are taking PrEP. Pregnancy  If you are premenopausal and you may become pregnant, ask your health care provider about preconception counseling.  If you may become  pregnant, take 400 to 800 micrograms (mcg) of folic acid every day.  If you want to prevent pregnancy, talk to your health care provider about birth control (contraception). Osteoporosis and menopause  Osteoporosis is a disease in which the bones lose minerals and strength with aging. This can result in serious bone fractures. Your risk for osteoporosis can be identified using a bone density scan.  If you are 65 years of age or older, or if you are at risk for osteoporosis and fractures, ask your health care provider if you should be screened.  Ask your health care provider whether you should take a calcium or vitamin D supplement to lower your risk for osteoporosis.  Menopause may have certain physical symptoms and risks.  Hormone replacement therapy may reduce some of these symptoms and risks. Talk to your health care provider about whether hormone replacement therapy is right for you. Follow these instructions at home:  Schedule regular health, dental, and eye exams.  Stay current with your immunizations.  Do not use any tobacco products including cigarettes, chewing tobacco, or electronic cigarettes.  If you are pregnant, do not drink alcohol.  If you are breastfeeding, limit how much and how often you drink alcohol.  Limit alcohol intake to no more than 1 drink per day for nonpregnant women. One drink equals 12 ounces of beer, 5 ounces of wine, or 1 ounces of hard liquor.  Do not use street drugs.  Do not share needles.  Ask your health care  provider for help if you need support or information about quitting drugs.  Tell your health care provider if you often feel depressed.  Tell your health care provider if you have ever been abused or do not feel safe at home. This information is not intended to replace advice given to you by your health care provider. Make sure you discuss any questions you have with your health care provider. Document Released: 12/28/2010 Document Revised: 11/20/2015 Document Reviewed: 03/18/2015 Else[employer] Interactive Patient Education  2018 [employer]   Preventing Unhealthy Weight Gain, Adult Staying at a healthy weight is important. When fat builds up in your body, you may become overweight or obese. These conditions put you at greater risk for developing certain health problems, such as heart disease, diabetes, sleeping problems, joint problems, and some cancers. Unhealthy weight gain is often the result of making unhealthy choices in what you eat. It is also a result of not getting enough exercise. You can make changes to your lifestyle to prevent obesity and stay as healthy as possible. What nutrition changes can be made? To maintain a healthy weight and prevent obesity:  Eat only as much as your body needs. To do this: ? Pay attention to signs that you are hungry or full. Stop eating as soon as you feel full. ? If you feel hungry, try drinking water first. Drink enough water so your urine is clear or pale yellow. ? Eat smaller portions. ? Look at serving sizes on food labels. Most foods contain more than one serving per container. ? Eat the recommended amount of calories for your gender and activity level. While most active people should eat around 2,000 calories per day, if you are trying to lose weight or are not very active, you main need to eat less calories. Talk to your health care provider or dietitian about how many calories you should eat each day.  Choose healthy foods, such as: ? Fruits  and vegetables. Try to fill at   least half of your plate at each meal with fruits and vegetables. ? Whole grains, such as whole wheat bread, brown rice, and quinoa. ? Lean meats, such as chicken or fish. ? Other healthy proteins, such as beans, eggs, or tofu. ? Healthy fats, such as nuts, seeds, fatty fish, and olive oil. ? Low-fat or fat-free dairy.  Check food labels and avoid food and drinks that: ? Are high in calories. ? Have added sugar. ? Are high in sodium. ? Have saturated fats or trans fats.  Limit how much you eat of the following foods: ? Prepackaged meals. ? Fast food. ? Fried foods. ? Processed meat, such as bacon, sausage, and deli meats. ? Fatty cuts of red meat and poultry with skin.  Cook foods in healthier ways, such as by baking, broiling, or grilling.  When grocery shopping, try to shop around the outside of the store. This helps you buy mostly fresh foods and avoid canned and prepackaged foods.  What lifestyle changes can be made?  Exercise at least 30 minutes 5 or more days each week. Exercising includes brisk walking, yard work, biking, running, swimming, and team sports like basketball and soccer. Ask your health care provider which exercises are safe for you.  Do not use any products that contain nicotine or tobacco, such as cigarettes and e-cigarettes. If you need help quitting, ask your health care provider.  Limit alcohol intake to no more than 1 drink a day for nonpregnant women and 2 drinks a day for men. One drink equals 12 oz of beer, 5 oz of wine, or 1 oz of hard liquor.  Try to get 7-9 hours of sleep each night. What other changes can be made?  Keep a food and activity journal to keep track of: ? What you ate and how many calories you had. Remember to count sauces, dressings, and side dishes. ? Whether you were active, and what exercises you did. ? Your calorie, weight, and activity goals.  Check your weight regularly. Track any changes. If  you notice you have gained weight, make changes to your diet or activity routine.  Avoid taking weight-loss medicines or supplements. Talk to your health care provider before starting any new medicine or supplement.  Talk to your health care provider before trying any new diet or exercise plan. Why are these changes important? Eating healthy, staying active, and having healthy habits not only help prevent obesity, they also:  Help you to manage stress and emotions.  Help you to connect with friends and family.  Improve your self-esteem.  Improve your sleep.  Prevent long-term health problems.  What can happen if changes are not made? Being obese or overweight can cause you to develop joint or bone problems, which can make it hard for you to stay active or do activities you enjoy. Being obese or overweight also puts stress on your heart and lungs and can lead to health problems like diabetes, heart disease, and some cancers. Where to find more information: Talk with your health care provider or a dietitian about healthy eating and healthy lifestyle choices. You may also find other information through these resources:  [place] Department [employer] MyPlate: www.choosemyplate.gov  American Heart Association: www.heart.org  Centers for Disease Control and Prevention: www.cdc.gov  Summary  Staying at a healthy weight is important. It helps prevent certain diseases and health problems, such as heart disease, diabetes, joint problems, sleep disorders, and some cancers.  Being obese or overweight can cause you   to develop joint or bone problems, which can make it hard for you to stay active or do activities you enjoy.  You can prevent unhealthy weight gain by eating a healthy diet, exercising regularly, not smoking, limiting alcohol, and getting enough sleep.  Talk with your health care provider or a dietitian for guidance about healthy eating and healthy lifestyle choices. This  information is not intended to replace advice given to you by your health care provider. Make sure you discuss any questions you have with your health care provider. Document Released: 06/15/2016 Document Revised: 07/21/2016 Document Reviewed: 07/21/2016 [employer] Interactive Patient Education  2018 [employer]  
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
## 2017-01-12 ENCOUNTER — Encounter: Payer: BLUE CROSS/BLUE SHIELD | Admitting: Obstetrics and Gynecology

## 2017-01-19 ENCOUNTER — Ambulatory Visit (INDEPENDENT_AMBULATORY_CARE_PROVIDER_SITE_OTHER): Payer: BLUE CROSS/BLUE SHIELD | Admitting: Obstetrics and Gynecology

## 2017-01-19 ENCOUNTER — Encounter: Payer: Self-pay | Admitting: Obstetrics and Gynecology

## 2017-01-19 VITALS — BP 140/84 | HR 84 | Ht 65.0 in | Wt 205.8 lb

## 2017-01-19 DIAGNOSIS — E669 Obesity, unspecified: Secondary | ICD-10-CM

## 2017-01-19 MED ORDER — CYANOCOBALAMIN 1000 MCG/ML IJ SOLN: 1000.0000 ug | INTRAMUSCULAR | 1 refills | Status: DC

## 2017-01-19 MED ORDER — PHENTERMINE HCL 37.5 MG PO TABS: 37.5000 mg | ORAL_TABLET | Freq: Every day | ORAL | 2 refills | Status: DC

## 2017-01-19 NOTE — Patient Instructions (Signed)

## 2017-01-19 NOTE — Progress Notes (Signed)
Subjective:  Ashley Ashley [name] is a 51 y.o. G1P1001 at Unknown being seen today for weight loss management- initial visit.  Patient reports General ROS: negative and reports previous weight loss attempts: Management changes made at the last visit include:    Onset was gradual,  Over 5 year(s) ago.    Pertinent medical history includes: chronic digestive disease, diabetes, eating disorder, anxiety and psychiatric illness.  Risk factors include: social isolation, poverty, alcoholism, illicit drug use, excessive exercise, poor dentition and new medication.  The patient has a surgical history of: thyroidectomy, gastrectomy, bariatric surgery, cholecystectomy and hysterectomy.  Pertinent social history includes: alcohol abuse, tobacco abuse and marijuana use. Past evaluation has included: metabolic profile, hemoglobin A1c, thyroid panel  and psychiatric evaluation.  Past treatment has included: small frequent feedings, nutritional supplement, vitamin supplement, social assistance, psychiatrist care, antidepressant, vitamin B-12 injections, appetite stimulant, exercise management and discontinuation of medication.  The following portions of the patient's history were reviewed and updated as appropriate: allergies, current medications, past family history, past medical history, past social history, past surgical history and problem list.   Objective:   Vitals:   01/19/17 1603  BP: 140/84  Pulse: 84  Weight: 205 lb 12.8 oz (93.4 kg)  Height: 5\' 5"  (1.651 m)    General:  Alert, oriented and cooperative. Patient is in no acute distress.  :   :   :   :   :   :   PE: Well groomed female in no current distress,   Mental Status: Normal mood and affect. Normal behavior. Normal judgment and thought content.   Current BMI: Body mass index is 34.25 kg/m.   Assessment and Plan:  Obesity  1. Obesity (BMI 30.0-34.9)   Plan: low carb, High protein diet RX for adipex 37.5 mg daily and B12  1048mcg.ml monthly, to start now with first injection given at today's visit. Reviewed side-effects common to both medications and expected outcomes. Increase daily water intake to at least 8 bottle a day, every day.  Goal is to reduse weight by 10% by end of three months, and will re-evaluate then.  RTC in 4 weeks for Nurse visit to check weight & BP, and get next B12 injections.    Please refer to After Visit Summary for other counseling recommendations.    Ashley Ashley [name], Ashley Ashley [name], Ashley Ashley [name]   Ashley Ashley [name], Ashley Ashley [name]      Consider the Low Glycemic Index Diet and 6 smaller meals daily .  This boosts your metabolism and regulates your sugars:   Use the protein bar by [employer] because they have lots of fiber in them  Find the low carb flatbreads, tortillas and pita breads for sandwiches:  Joseph's makes a pita bread and a flat bread , available at [facility] and [employer]; [place] makes a low carb flatbread available at [employer] and HT that is 9 net carbs and 100 cal [place] makes a low carb whole wheat tortilla available at [employer] most grocery stores with 6 net carbs and 210 cal  [place] yogurt can still have a lot of carbs .  Dannon Light Ashley [name] fit has 80 cal and 8 carbs

## 2017-02-16 ENCOUNTER — Ambulatory Visit (INDEPENDENT_AMBULATORY_CARE_PROVIDER_SITE_OTHER): Payer: 59 | Admitting: Obstetrics and Gynecology

## 2017-02-16 VITALS — BP 131/84 | HR 91 | Wt 196.1 lb

## 2017-02-16 DIAGNOSIS — E663 Overweight: Secondary | ICD-10-CM | POA: Diagnosis not present

## 2017-02-16 MED ORDER — CYANOCOBALAMIN 1000 MCG/ML IJ SOLN
1000.0000 ug | Freq: Once | INTRAMUSCULAR | Status: AC
  Administered 2017-02-16: 1000 ug via INTRAMUSCULAR

## 2017-02-16 NOTE — Progress Notes (Signed)
Pt is here for wt, bp check, b-12 inj she is doing well, denies any s/e is happy with weight loss   02/16/17 wt- 194.1lb 01/19/17 wt- 205lb

## 2017-03-04 ENCOUNTER — Telehealth: Payer: Self-pay | Admitting: Obstetrics and Gynecology

## 2017-03-04 DIAGNOSIS — B9689 Other specified bacterial agents as the cause of diseases classified elsewhere: Secondary | ICD-10-CM

## 2017-03-04 DIAGNOSIS — N760 Acute vaginitis: Principal | ICD-10-CM

## 2017-03-04 NOTE — Telephone Encounter (Signed)
Patient had BV before and you called in medication without visit - she has another one could you please send med to [facility]   Please call

## 2017-03-07 MED ORDER — METRONIDAZOLE 500 MG PO TABS: 500.0000 mg | ORAL_TABLET | Freq: Two times a day (BID) | ORAL | 0 refills | Status: DC

## 2017-03-07 NOTE — Telephone Encounter (Signed)
MD out of office. RX sent in. Pt to call back if sx do not improve.

## 2017-03-15 ENCOUNTER — Ambulatory Visit (INDEPENDENT_AMBULATORY_CARE_PROVIDER_SITE_OTHER): Payer: 59 | Admitting: Obstetrics and Gynecology

## 2017-03-15 VITALS — BP 128/84 | HR 78 | Wt 191.7 lb

## 2017-03-15 DIAGNOSIS — E663 Overweight: Secondary | ICD-10-CM

## 2017-03-15 MED ORDER — CYANOCOBALAMIN 1000 MCG/ML IJ SOLN
1000.0000 ug | Freq: Once | INTRAMUSCULAR | Status: AC
  Administered 2017-03-15: 1000 ug via INTRAMUSCULAR

## 2017-03-15 NOTE — Progress Notes (Signed)
Pt is here for wt, bp check, b-12 inj She is doing well  03/15/17 wt- 191.7lb 02/16/17 wt- 196lb

## 2017-04-12 ENCOUNTER — Ambulatory Visit (INDEPENDENT_AMBULATORY_CARE_PROVIDER_SITE_OTHER): Payer: 59 | Admitting: Obstetrics and Gynecology

## 2017-04-12 VITALS — BP 148/88 | HR 91 | Wt 187.1 lb

## 2017-04-12 DIAGNOSIS — E663 Overweight: Secondary | ICD-10-CM

## 2017-04-12 MED ORDER — CYANOCOBALAMIN 1000 MCG/ML IJ SOLN: 1000.0000 ug | Freq: Once | INTRAMUSCULAR | Status: DC

## 2017-04-12 NOTE — Progress Notes (Signed)
Pt is here for wt, bp check, b-12 inj She is doing well  04/12/17 wt- 187.1lb 03/15/17 wt- 191lb

## 2017-04-21 ENCOUNTER — Encounter: Payer: 59 | Admitting: Obstetrics and Gynecology

## 2017-04-27 ENCOUNTER — Encounter: Payer: Self-pay | Admitting: Obstetrics and Gynecology

## 2017-04-27 ENCOUNTER — Ambulatory Visit (INDEPENDENT_AMBULATORY_CARE_PROVIDER_SITE_OTHER): Payer: 59 | Admitting: Obstetrics and Gynecology

## 2017-04-27 VITALS — BP 158/88 | HR 77 | Ht 65.0 in | Wt 186.7 lb

## 2017-04-27 DIAGNOSIS — E669 Obesity, unspecified: Secondary | ICD-10-CM | POA: Diagnosis not present

## 2017-04-27 MED ORDER — CYANOCOBALAMIN 1000 MCG/ML IJ SOLN: 1000.0000 ug | INTRAMUSCULAR | 1 refills | Status: DC

## 2017-04-27 MED ORDER — PHENTERMINE HCL 37.5 MG PO TABS: 37.5000 mg | ORAL_TABLET | Freq: Every day | ORAL | 2 refills | Status: DC

## 2017-04-27 NOTE — Progress Notes (Signed)
SUBJECTIVE:  51 y.o. here for follow-up weight loss visit, previously seen 4 weeks ago. Denies any concerns and feels like medication is still working well, and has been off a week.  OBJECTIVE:  BP (!) 158/88   Pulse 77   Ht 5\' 5"  (1.651 m)   Wt 186 lb 11.2 oz (84.7 kg)   LMP 04/14/2017   BMI 31.07 kg/m   Body mass index is 31.07 kg/m. Patient appears well. ASSESSMENT:  Obesity- responding well to weight loss plan PLAN:  To continue with current medications. B12 1083mcg/ml injection given RTC in 4 weeks as planned  Melody [place], CNM

## 2017-05-02 ENCOUNTER — Other Ambulatory Visit: Payer: Self-pay | Admitting: Cardiology

## 2017-05-02 ENCOUNTER — Ambulatory Visit
Admission: RE | Admit: 2017-05-02 | Discharge: 2017-05-02 | Disposition: A | Discharge status: Home / Self Care | Payer: 59 | Source: Ambulatory Visit | Attending: Internal Medicine | Admitting: Internal Medicine

## 2017-05-02 ENCOUNTER — Ambulatory Visit
Admission: RE | Admit: 2017-05-02 | Discharge: 2017-05-02 | Disposition: A | Discharge status: Home / Self Care | Payer: 59 | Source: Ambulatory Visit | Attending: Cardiology | Admitting: Cardiology

## 2017-05-02 DIAGNOSIS — R079 Chest pain, unspecified: Secondary | ICD-10-CM

## 2017-05-02 DIAGNOSIS — R222 Localized swelling, mass and lump, trunk: Secondary | ICD-10-CM

## 2017-05-02 DIAGNOSIS — M799 Soft tissue disorder, unspecified: Secondary | ICD-10-CM | POA: Insufficient documentation

## 2017-05-30 ENCOUNTER — Ambulatory Visit (INDEPENDENT_AMBULATORY_CARE_PROVIDER_SITE_OTHER): Payer: 59 | Admitting: Obstetrics and Gynecology

## 2017-05-30 ENCOUNTER — Encounter: Payer: Self-pay | Admitting: Obstetrics and Gynecology

## 2017-05-30 MED ORDER — CYANOCOBALAMIN 1000 MCG/ML IJ SOLN
1000.0000 ug | Freq: Once | INTRAMUSCULAR | Status: AC
  Administered 2017-05-30: 1000 ug via INTRAMUSCULAR

## 2017-05-30 NOTE — Progress Notes (Signed)
Pt is here for wt, bp check, b-12 inj She is doing well  05/30/17 wt-183.7lb 04/27/17 wt- 186lb

## 2017-06-29 ENCOUNTER — Ambulatory Visit (INDEPENDENT_AMBULATORY_CARE_PROVIDER_SITE_OTHER): Payer: 59 | Admitting: Obstetrics and Gynecology

## 2017-06-29 ENCOUNTER — Encounter: Payer: Self-pay | Admitting: Obstetrics and Gynecology

## 2017-06-29 VITALS — BP 148/90 | HR 95 | Wt 183.5 lb

## 2017-06-29 DIAGNOSIS — E669 Obesity, unspecified: Secondary | ICD-10-CM

## 2017-06-29 MED ORDER — CYANOCOBALAMIN 1000 MCG/ML IJ SOLN: 1000.0000 ug | Freq: Once | INTRAMUSCULAR | Status: DC

## 2017-06-29 NOTE — Progress Notes (Signed)
Pt is here for wt, bp check, b-12 inj She is doing well  06/29/17 wt- 183 05/30/17 wt- 183

## 2017-07-20 IMAGING — US US SOFT TISSUE HEAD/NECK
1 series · 13 of 25 positions shown · non-contrast
Comparison: CT of the neck on 04/28/2015

CLINICAL DATA: Left thyroid nodule detected by CT of the neck.
Sonographic characterization performed prior to potential nodule
biopsy.

EXAM:
THYROID ULTRASOUND
TECHNIQUE: Ultrasound examination of the thyroid gland and adjacent soft
tissues was performed.

[Series 1: us soft tissue head/neck · 0.07mm/px · 13 of 49 slices shown]
[im 1/49]
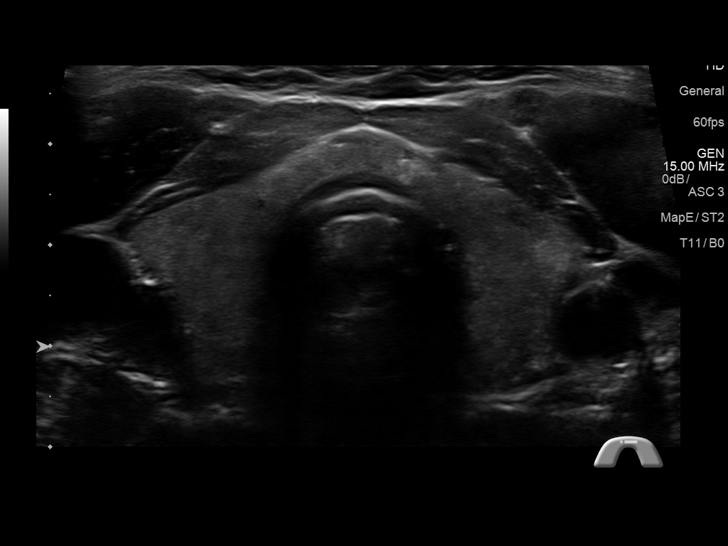
[im 5/49]
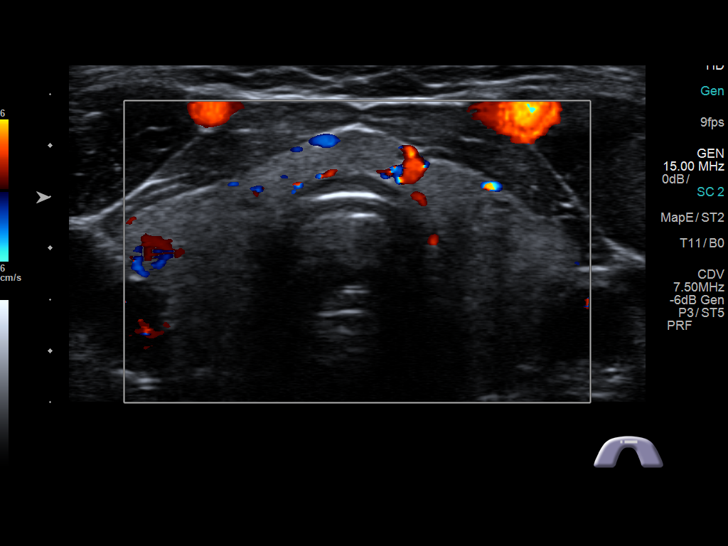
[im 9/49]
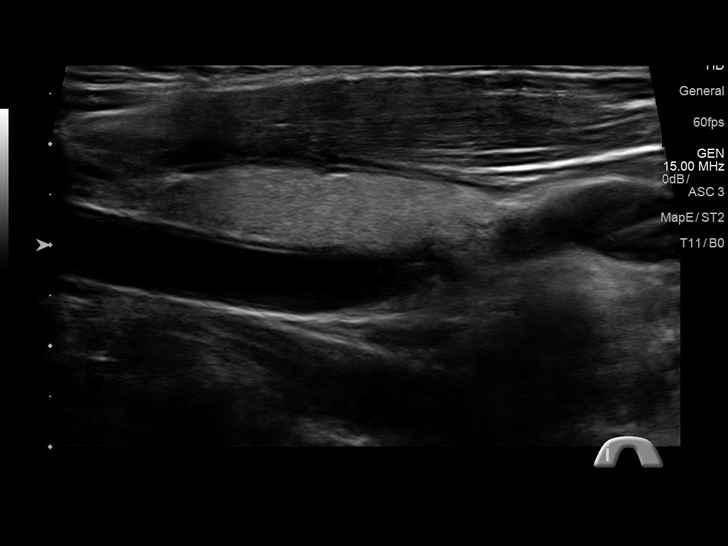
[im 13/49]
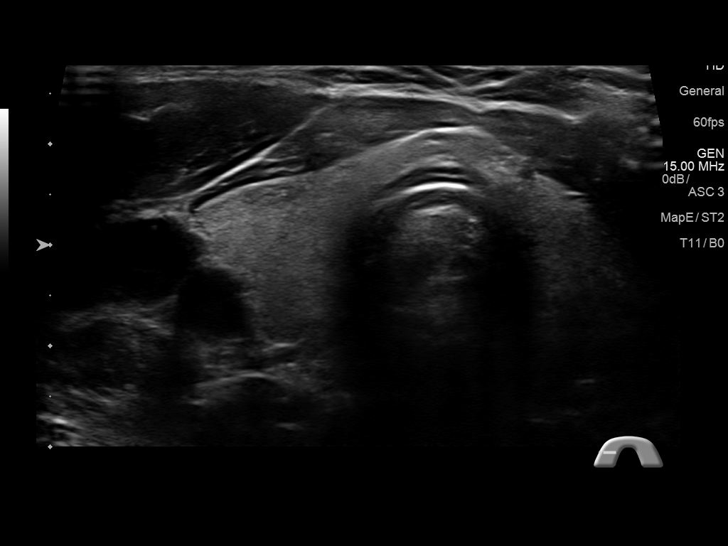
[im 17/49]
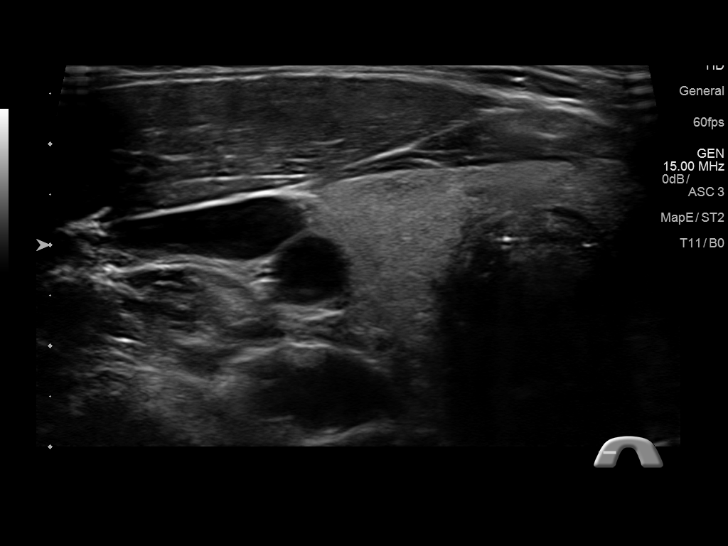
[im 21/49]
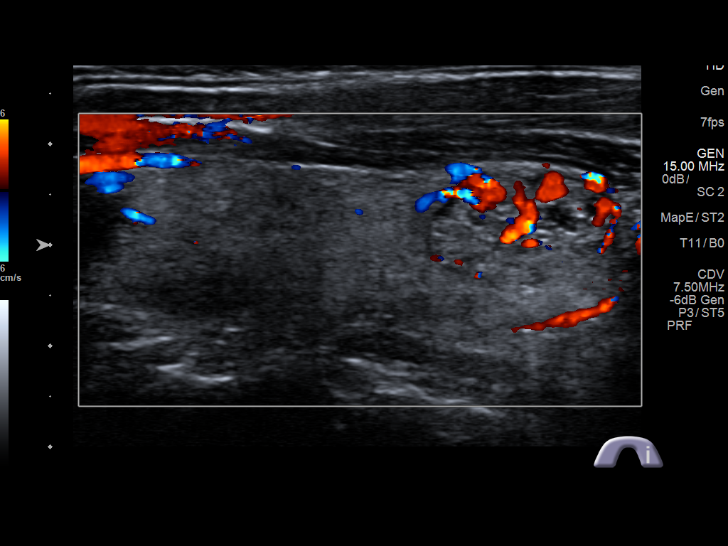
[im 25/49]
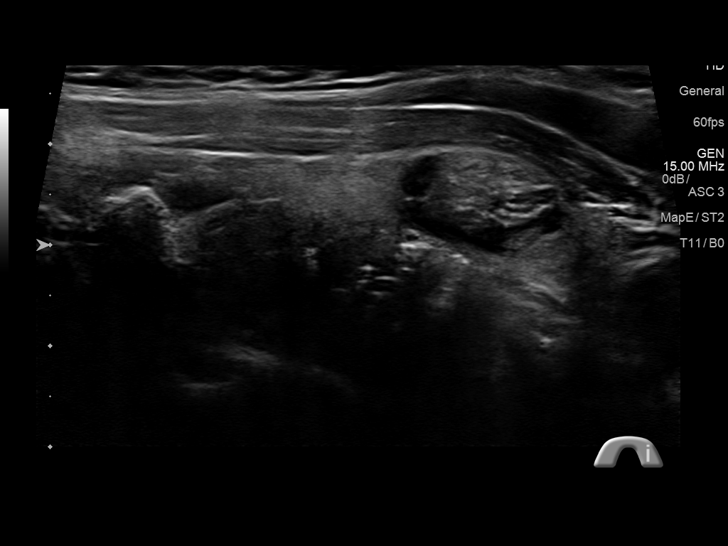
[im 29/49]
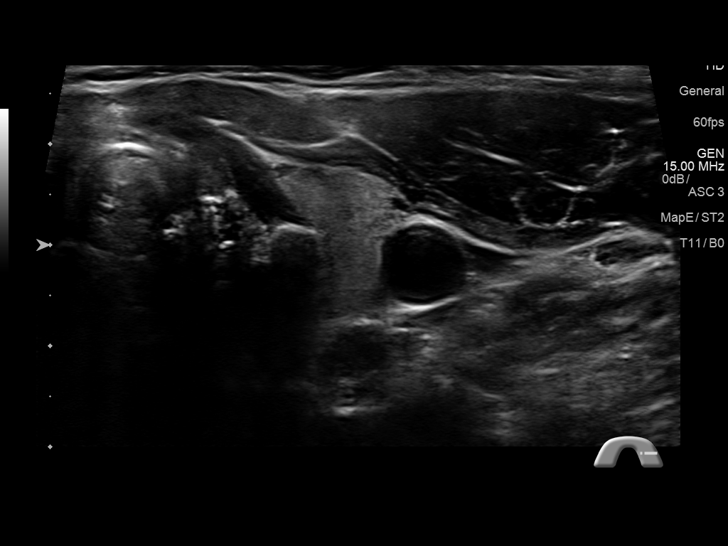
[im 33/49]
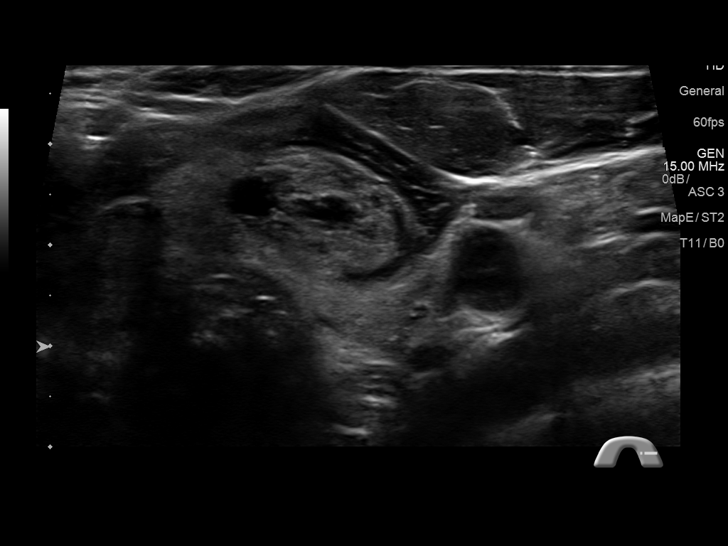
[im 37/49]
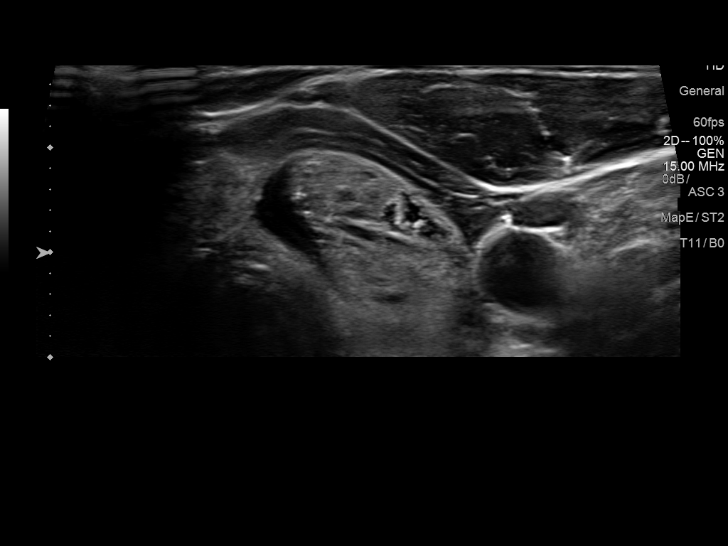
[im 41/49]
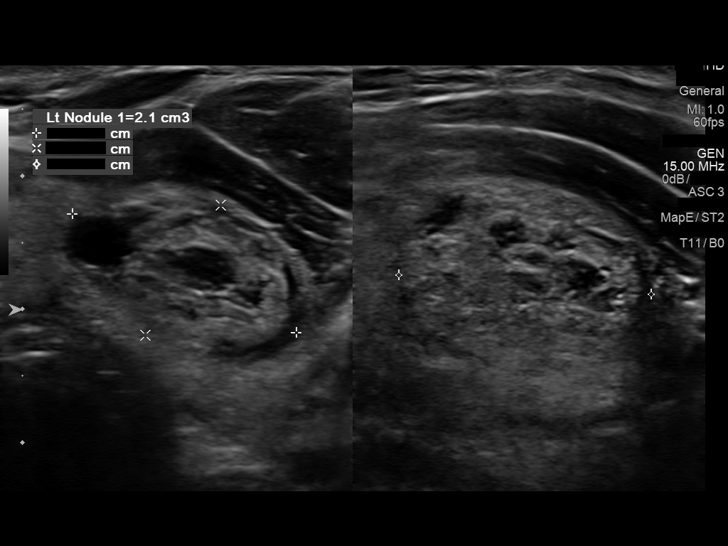
[im 45/49]
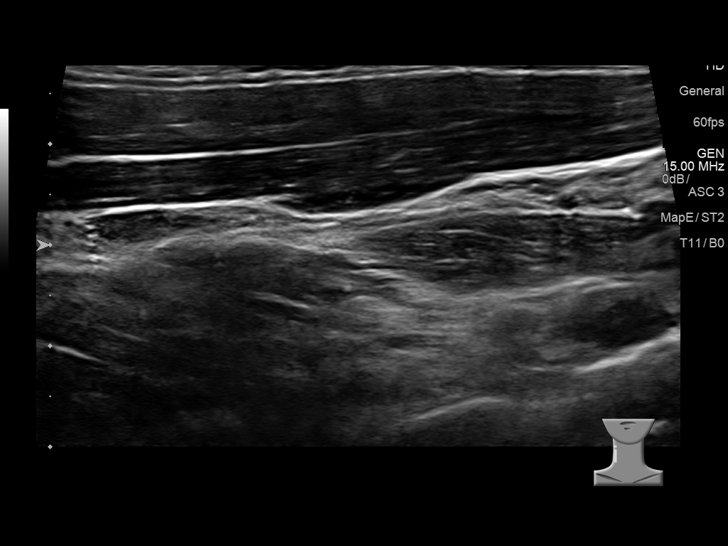
[im 49/49]
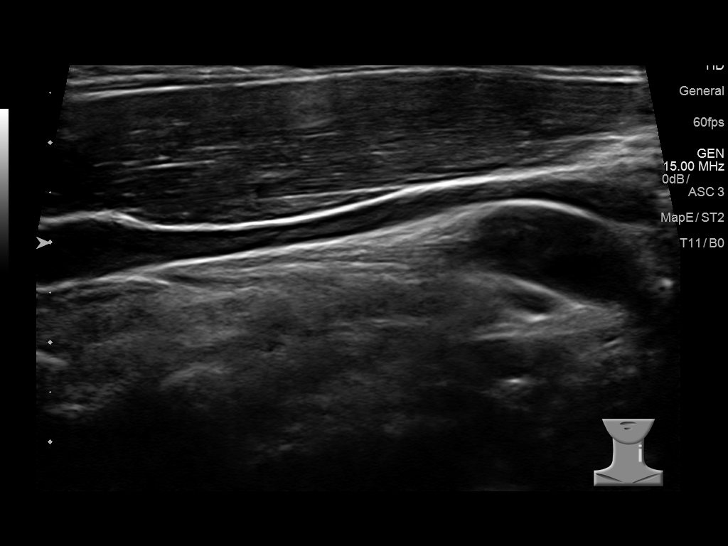

[13 of 25 positions shown; findings below may reference images not displayed]

FINDINGS: Right thyroid lobe

Measurements: 4.9 x 1.9 x 1.5 cm. No nodules visualized. Homogeneous
parenchyma. No abnormal vascularity.

Left thyroid lobe

Measurements: 5.7 x 1.7 x 1.8 cm. Well-circumscribed noncalcified
nodule in the inferior left lobe measures approximately 1.9 x 1.1 x
1.9 cm. This nodule is roughly [DATE] solid and [DATE] cystic and has the
appearance of a likely degenerating/colloid nodule. No other
left-sided nodules. Otherwise homogeneous parenchyma. No abnormal
vascularity.

Isthmus

Thickness: 0.5 cm.  No nodules visualized.

Lymphadenopathy

None visualized.
IMPRESSION: 1. Solitary 1.9 x 1.1 x 1.9 cm left thyroid nodule which is roughly
[DATE] solid and [DATE] cystic. This nodule meets size criteria for biopsy
but is likely benign based on sonographic appearance. Findings meet
consensus criteria for biopsy. Ultrasound-guided fine needle
aspiration should be considered, as per the consensus statement:
Management of Thyroid Nodules Detected at US: Society of
Radiologists in Ultrasound Consensus Conference Statement. Radiology
5550; [DATE].
2. Mild enlargement of the thyroid gland overall with homogeneous
parenchyma and no abnormal vascularity to suggest significant
thyroiditis.
3. Ultrasound-guided biopsy of the dominant left thyroid nodule was
subsequently performed after the diagnostic ultrasound. The biopsy
procedure is separately dictated.

## 2017-08-11 ENCOUNTER — Encounter: Payer: 59 | Admitting: Obstetrics and Gynecology

## 2017-08-12 ENCOUNTER — Other Ambulatory Visit: Payer: Self-pay

## 2017-08-12 ENCOUNTER — Emergency Department
Admission: EM | Admit: 2017-08-12 | Discharge: 2017-08-12 | Disposition: A | Discharge status: Home / Self Care | Payer: 59 | Attending: Emergency Medicine | Admitting: Emergency Medicine

## 2017-08-12 ENCOUNTER — Encounter: Payer: Self-pay | Admitting: Emergency Medicine

## 2017-08-12 DIAGNOSIS — R112 Nausea with vomiting, unspecified: Secondary | ICD-10-CM | POA: Diagnosis not present

## 2017-08-12 DIAGNOSIS — R42 Dizziness and giddiness: Secondary | ICD-10-CM

## 2017-08-12 DIAGNOSIS — R509 Fever, unspecified: Secondary | ICD-10-CM | POA: Diagnosis not present

## 2017-08-12 DIAGNOSIS — I951 Orthostatic hypotension: Secondary | ICD-10-CM | POA: Diagnosis not present

## 2017-08-12 DIAGNOSIS — R197 Diarrhea, unspecified: Secondary | ICD-10-CM

## 2017-08-12 LAB — URINALYSIS, COMPLETE (UACMP) WITH MICROSCOPIC
BILIRUBIN URINE: NEGATIVE
Bacteria, UA: NONE SEEN
Glucose, UA: NEGATIVE mg/dL
KETONES UR: NEGATIVE mg/dL
LEUKOCYTES UA: NEGATIVE
Nitrite: NEGATIVE
PH: 6 (ref 5.0–8.0)
Protein, ur: NEGATIVE mg/dL
Specific Gravity, Urine: 1.012 (ref 1.005–1.030)

## 2017-08-12 LAB — CBC
HEMATOCRIT: 39.5 % (ref 35.0–47.0)
HEMOGLOBIN: 13.4 g/dL (ref 12.0–16.0)
MCH: 31.3 pg (ref 26.0–34.0)
MCHC: 33.9 g/dL (ref 32.0–36.0)
MCV: 92.5 fL (ref 80.0–100.0)
PLATELETS: 379 10*3/uL (ref 150–440)
RBC: 4.26 MIL/uL (ref 3.80–5.20)
RDW: 13.5 % (ref 11.5–14.5)
WBC: 6.3 10*3/uL (ref 3.6–11.0)

## 2017-08-12 LAB — BASIC METABOLIC PANEL
ANION GAP: 11 (ref 5–15)
BUN: 10 mg/dL (ref 6–20)
CHLORIDE: 105 mmol/L (ref 101–111)
CO2: 22 mmol/L (ref 22–32)
CREATININE: 0.86 mg/dL (ref 0.44–1.00)
Calcium: 9 mg/dL (ref 8.9–10.3)
GFR calc non Af Amer: >60 mL/min (ref >60)
Glucose, Bld: 123 mg/dL — ABNORMAL HIGH (ref 65–99)
POTASSIUM: 4.1 mmol/L (ref 3.5–5.1)
SODIUM: 138 mmol/L (ref 135–145)

## 2017-08-12 LAB — TSH: TSH: 0.725 u[IU]/mL (ref 0.350–4.500)

## 2017-08-12 LAB — TROPONIN I

## 2017-08-12 MED ORDER — ONDANSETRON 4 MG PO TBDP: 4.0000 mg | ORAL_TABLET | Freq: Three times a day (TID) | ORAL | 0 refills | Status: DC | PRN

## 2017-08-12 NOTE — ED Provider Notes (Addendum)
[facility] Emergency Department Provider Note  ____________________________________________  Time seen: Approximately 5:02 PM  I have reviewed the triage vital signs and the nursing notes.   HISTORY  Chief Complaint Dizziness and Nausea    HPI Ashley [name] is a 52 y.o. female with a history of enlarged thyroid presenting with ongoing lightheadedness, feeling off balance, nausea.  The patient reports that she has been feeling badly for several months, and is following with Dr. Francoise Schaumann for her thyroid although she is unclear what kind of physician this is.  The patient reports that she frequently has episodes where she feels lightheaded or has difficulty walking and needs to hold onto furniture, worse in the morning.  Yesterday she had a single episode of nausea vomiting and diarrhea.  She also occasionally feels hot, and states that the temperature of 100.1 is normal for her.  She has not had any dysuria, further nausea vomiting or diarrhea, abdominal pain, cough or cold symptoms, chest pain, palpitations, lightheadedness or syncope.  The patient came for evaluation today because her symptoms were more pronounced than usual.  She reports that she has only had 8oz of water and Chick Fila sandwich.  She has no personal or family history of blood clots.  She states " I need my thyroid taken out but they keep dragging their feet."  At this time, the patient feels significantly better than upon arrival.  Past Medical History:  Diagnosis Date  . Thyroid enlarged    pt reports "being watched"    Patient Active Problem List   Diagnosis Date Noted  . Premenstrual symptom 02/06/2016  . Menstrual migraine without status migrainosus, not intractable 02/06/2016  . Special screening for malignant neoplasms, colon     Past Surgical History:  Procedure Laterality Date  . COLONOSCOPY WITH PROPOFOL N/A 12/26/2015   Procedure: COLONOSCOPY WITH PROPOFOL;  Surgeon: Lucilla Lame, MD;  Location: [place];  Service: Endoscopy;  Laterality: N/A;  . EXCISION MASS NECK Right 05/27/2015   Procedure: EXCISION MASS NECK RIGHT LIPOMA;  Surgeon: Clyde Canterbury, MD;  Location: [place];  Service: ENT;  Laterality: Right;  . TONSILLECTOMY    . TUBAL LIGATION      Current Outpatient Rx  . Order #: [id number] Class: Historical Med  . Order #: [id number] Class: Historical Med  . Order #: [id number] Class: Historical Med  . Order #: [id number] Class: Normal  . Order #: [id number] Class: Historical Med  . Order #: [id number] Class: Normal  . Order #: [id number] Class: Print  . Order #: [id number] Class: Print    Allergies Patient has no known allergies.  Family History  Problem Relation Age of Onset  . Hypertension Mother   . Hypertension Father   . Diabetes Father     Social History Social History   Tobacco Use  . Smoking status: Never Smoker  . Smokeless tobacco: Never Used  Substance Use Topics  . Alcohol use: No  . Drug use: No    Review of Systems Constitutional: + fever 100.1 at home; general malaise.  Lightheadedness w/o syncope. Eyes: No visual changes.  No blurred or double vision. ENT: No sore throat. No congestion or rhinorrhea. Cardiovascular: Denies chest pain. Denies palpitations. Respiratory: Denies shortness of breath.  No cough. Gastrointestinal: No abdominal pain.  Single episode of nausea vomiting and diarrhea yesterday, now resolved.  No constipation. Genitourinary: Negative for dysuria.  No urinary frequency. Musculoskeletal: Negative for back pain. Skin: Negative for rash. Neurological: Negative for headaches. No focal numbness,  tingling or weakness.  Positive difficulty walking due to gait imbalance.    ____________________________________________   PHYSICAL EXAM:  VITAL SIGNS: ED Triage Vitals  Enc Vitals Group     BP 08/12/17 1224 (!) 150/91     Pulse Rate 08/12/17 1224 72     Resp 08/12/17 1224 17     Temp  08/12/17 1224 97.8 F (36.6 C)     Temp Source 08/12/17 1224 Oral     SpO2 08/12/17 1224 99 %     Weight 08/12/17 1225 175 lb (79.4 kg)     Height 08/12/17 1225 5\' 5"  (1.651 m)     Head Circumference --      Peak Flow --      Pain Score --      Pain Loc --      Pain Edu? --      Excl. in [place]? --     Constitutional: Alert and oriented. Well appearing and in no acute distress. Answers questions appropriately. Eyes: Conjunctivae are normal.  EOMI. PERRLA.  No scleral icterus. Head: Atraumatic. Nose: No congestion/rhinnorhea. Mouth/Throat: Mucous membranes are mildly dry.  Neck: No stridor.  Supple.  No JVD.  No meningismus.  Mildly enlarged thyroid. Cardiovascular: Normal rate, regular rhythm. No murmurs, rubs or gallops.  Respiratory: Normal respiratory effort.  No accessory muscle use or retractions. Lungs CTAB.  No wheezes, rales or ronchi. Gastrointestinal: Soft, nontender and nondistended.  No guarding or rebound.  No peritoneal signs. Musculoskeletal: No LE edema. No ttp in the calves or palpable cords.  Negative Homan's sign. Neurologic: Alert and oriented 3. Speech is clear. Face and smile symmetric. Tongue is midline.  EOMI.  PERRLA.  No horizontal or vertical nystagmus.  No pronator drift. 5 out of 5 grip, biceps, triceps, hip flexors, plantar flexion and dorsiflexion. Normal sensation to light touch in the bilateral upper and lower extremities, and face. Normal gait without ataxia Skin:  Skin is warm, dry and intact. No rash noted.   ____________________________________________   LABS (all labs ordered are listed, but only abnormal results are displayed)  Labs Reviewed  BASIC METABOLIC PANEL - Abnormal; Notable for the following components:      Result Value   Glucose, Bld 123 (*)    All other components within normal limits  URINALYSIS, COMPLETE (UACMP) WITH MICROSCOPIC - Abnormal; Notable for the following components:   Color, Urine YELLOW (*)    APPearance CLEAR (*)     Hgb urine dipstick LARGE (*)    Squamous Epithelial / LPF 0-5 (*)    All other components within normal limits  CBC  TSH  TROPONIN I  POC URINE PREG, ED   ____________________________________________  EKG  ED ECG REPORT I, Eula Listen, the attending physician, personally viewed and interpreted this ECG.   Date: 08/12/2017  EKG Time: 1230  Rate: 71  Rhythm: normal sinus rhythm  Axis: normal  Intervals:none  ST&T Change: No STEMI  ____________________________________________  RADIOLOGY  No results found.  ____________________________________________   PROCEDURES  Procedure(s) performed: None  Procedures  Critical Care performed: No ____________________________________________   INITIAL IMPRESSION / ASSESSMENT AND PLAN / ED COURSE  Pertinent labs & imaging results that were available during my care of the patient were reviewed by me and considered in my medical decision making (see chart for details).  52 y.o. female presenting with ongoing symptoms of lightheadedness, dizziness, nausea, and elevated temperatures.  Overall, here the patient is hemo-dynamically stable and afebrile.  She has  no focal neurologic deficits on examination it is unlikely that her symptoms are due to stroke.  Her laboratory studies including urinalysis, electrolytes, and blood counts are also reassuring.  Her EKG does not show any ischemic changes or arrhythmias, and her troponin is negative.  At this time, the patient feels back to her baseline, and is requesting discharge.  I do not see any evidence of an acute emergency condition today.  The patient will be checked for orthostasis, as her mucous membranes are dry, and this may be exacerbated by dehydration.  Anticipate discharge.  Return precautions as well as follow-up instructions were discussed.  ----------------------------------------- 5:14 PM on 08/12/2017 -----------------------------------------  The patient does  have a jump from 73-90 and her heart rate and decrease in her blood pressure during her orthostatics.  I have offered the patient intravenous fluids, and she has declined at this time.  I have encouraged her to drink plenty of fluid at home for rehydration and to follow-up with her primary care physician as soon as possible.  ____________________________________________  FINAL CLINICAL IMPRESSION(S) / ED DIAGNOSES  Final diagnoses:  Lightheadedness  Nausea vomiting and diarrhea  Elevated temperature  Orthostasis         NEW MEDICATIONS STARTED DURING THIS VISIT:  New Prescriptions   ONDANSETRON (ZOFRAN ODT) 4 MG DISINTEGRATING TABLET    Take 1 tablet (4 mg total) by mouth every 8 (eight) hours as needed for nausea or vomiting.      Eula Listen, MD 08/12/17 1714    Eula Listen, MD 08/12/17 [id number]

## 2017-08-12 NOTE — ED Triage Notes (Signed)
FIRST NURSE NOTE-here for dizziness, nausea, and hot/cold spells. Problems with thyroid and needs out.  Ambulatory without distress.

## 2017-08-12 NOTE — ED Triage Notes (Signed)
Pt presents to ED via POV with c/o nausea and dizziness. Pt states dizziness and nausea are intermittent. Pt states when she gets dizzy she feels hot and has intermittent chills as well. Pt A&O x4, NAD noted at this time.

## 2017-08-12 NOTE — Discharge Instructions (Signed)
Please drink plenty of fluid, get plenty of rest and eat small regular healthy meals throughout the day.  Return to the emergency department if you develop lightheadedness or fainting, severe pain, fever, inability to keep down fluids, or any other symptoms concerning to you.

## 2017-10-24 ENCOUNTER — Other Ambulatory Visit: Payer: Self-pay | Admitting: Internal Medicine

## 2017-10-24 DIAGNOSIS — Z1231 Encounter for screening mammogram for malignant neoplasm of breast: Secondary | ICD-10-CM

## 2017-11-08 ENCOUNTER — Encounter: Payer: Self-pay | Admitting: Radiology

## 2017-11-08 ENCOUNTER — Ambulatory Visit
Admission: RE | Admit: 2017-11-08 | Discharge: 2017-11-08 | Disposition: A | Discharge status: Home / Self Care | Payer: 59 | Source: Ambulatory Visit | Attending: Internal Medicine | Admitting: Internal Medicine

## 2017-11-08 DIAGNOSIS — Z1231 Encounter for screening mammogram for malignant neoplasm of breast: Secondary | ICD-10-CM | POA: Diagnosis not present

## 2017-11-28 ENCOUNTER — Inpatient Hospital Stay
Admission: RE | Admit: 2017-11-28 | Discharge: 2017-11-28 | Disposition: A | Discharge status: Home / Self Care | Payer: Self-pay | Source: Ambulatory Visit | Attending: *Deleted | Admitting: *Deleted

## 2017-11-28 ENCOUNTER — Other Ambulatory Visit: Payer: Self-pay | Admitting: *Deleted

## 2017-11-28 DIAGNOSIS — Z9289 Personal history of other medical treatment: Secondary | ICD-10-CM

## 2017-12-30 ENCOUNTER — Encounter: Payer: Self-pay | Admitting: Obstetrics and Gynecology

## 2017-12-30 ENCOUNTER — Other Ambulatory Visit (HOSPITAL_COMMUNITY)
Admission: RE | Admit: 2017-12-30 | Discharge: 2017-12-30 | Disposition: A | Discharge status: Home / Self Care | Payer: 59 | Source: Ambulatory Visit | Attending: Obstetrics and Gynecology | Admitting: Obstetrics and Gynecology

## 2017-12-30 ENCOUNTER — Ambulatory Visit (INDEPENDENT_AMBULATORY_CARE_PROVIDER_SITE_OTHER): Payer: 59 | Admitting: Obstetrics and Gynecology

## 2017-12-30 VITALS — BP 150/99 | HR 81 | Ht 65.0 in | Wt 191.7 lb

## 2017-12-30 DIAGNOSIS — Z01419 Encounter for gynecological examination (general) (routine) without abnormal findings: Secondary | ICD-10-CM | POA: Diagnosis not present

## 2017-12-30 DIAGNOSIS — N924 Excessive bleeding in the premenopausal period: Secondary | ICD-10-CM | POA: Diagnosis not present

## 2017-12-30 DIAGNOSIS — E669 Obesity, unspecified: Secondary | ICD-10-CM

## 2017-12-30 DIAGNOSIS — E039 Hypothyroidism, unspecified: Secondary | ICD-10-CM

## 2017-12-30 DIAGNOSIS — R030 Elevated blood-pressure reading, without diagnosis of hypertension: Secondary | ICD-10-CM | POA: Diagnosis not present

## 2017-12-30 NOTE — Progress Notes (Signed)
GYNECOLOGY ANNUAL PHYSICAL EXAM PROGRESS NOTE  Subjective:    Ashley [name] is a 52 y.o. G69P1001 female who presents for an annual exam.The patient is sexually active.The patient wears seatbelts: yes. The patient participates in regular exercise: no. Has the patient ever been transfused or tattooed?: no. The patient reports that there is not domestic violence in her life.    The patient has the following complaints today: 1. Patient complains of abnormal menstrual cycles over the past several months. States that she will have a cycle that lasts ~ 2 days and is light, and then will have a cycle 1-2 weeks later that lasts for a full week. The second menstrual cycle is heavier. Denies passage of clots. Reports minimal dysmenorrhea. Patient would like to discuss hysterectomy.   Gynecologic History  Menarche age: 12 No LMP recorded (lmp unknown). Patient is perimenopausal. Menstrual cycles irregular. Contraception: tubal ligation History of STI's: Denies Last Pap: 10/2014. Results were: normal.  Denies h/o abnormal pap smears. Last mammogram: 10/2017. Results were: normal Last colonoscopy: 11/2015. Results were: normal.    OB History  Gravida Para Term Preterm AB Living  1 1 1  0 0 1  SAB TAB Ectopic Multiple Live Births  0 0 0 0 1    # Outcome Date GA Lbr Len/2nd Weight Sex Delivery Anes PTL Lv  1 Term 1992 [redacted]w[redacted]d  6 lb 7 oz (2.92 kg) F Vag-Spont   LIV    Past Medical History:  Diagnosis Date  . Hypothyroidism   . Thyroid enlarged    pt reports "being watched"    Past Surgical History:  Procedure Laterality Date  . COLONOSCOPY WITH PROPOFOL N/A 12/26/2015   Procedure: COLONOSCOPY WITH PROPOFOL;  Surgeon: Lucilla Lame, MD;  Location: [place];  Service: Endoscopy;  Laterality: N/A;  . EXCISION MASS NECK Right 05/27/2015   Procedure: EXCISION MASS NECK RIGHT LIPOMA;  Surgeon: Clyde Canterbury, MD;  Location: [place];  Service: ENT;  Laterality: Right;  .  TONSILLECTOMY    . TUBAL LIGATION      Family History  Problem Relation Age of Onset  . Hypertension Mother   . Hypertension Father   . Diabetes Father     Social History   Socioeconomic History  . Marital status: Single    Spouse name: Not on file  . Number of children: Not on file  . Years of education: Not on file  . Highest education level: Not on file  Occupational History  . Not on file  Social Needs  . Financial resource strain: Not on file  . Food insecurity:    Worry: Not on file    Inability: Not on file  . Transportation needs:    Medical: Not on file    Non-medical: Not on file  Tobacco Use  . Smoking status: Never Smoker  . Smokeless tobacco: Never Used  Substance and Sexual Activity  . Alcohol use: No  . Drug use: No  . Sexual activity: Yes    Birth control/protection: None, Surgical  Lifestyle  . Physical activity:    Days per week: Not on file    Minutes per session: Not on file  . Stress: Not on file  Relationships  . Social connections:    Talks on phone: Not on file    Gets together: Not on file    Attends religious service: Not on file    Active member of club or organization: Not on file  Attends meetings of clubs or organizations: Not on file    Relationship status: Not on file  . Intimate partner violence:    Fear of current or ex partner: Not on file    Emotionally abused: Not on file    Physically abused: Not on file    Forced sexual activity: Not on file  Other Topics Concern  . Not on file  Social History Narrative  . Not on file    Current Outpatient Medications on File Prior to Visit  Medication Sig Dispense Refill  . BIOTIN PO Take by mouth.    . Cholecalciferol (VITAMIN D-3 PO) Take by mouth.    . levothyroxine (SYNTHROID) 25 MCG tablet Take 25 mcg by mouth daily before breakfast.    . loratadine (CLARITIN) 10 MG tablet Take 10 mg by mouth daily.    Marland Kitchen CLARITIN-D 12 HOUR 5-120 MG tablet Take 1 tablet by mouth 2 (two)  times daily.  4  . cyanocobalamin (,VITAMIN B-12,) 1000 MCG/ML injection Inject 1 mL (1,000 mcg total) into the muscle every 30 (thirty) days. (Patient not taking: Reported on 12/30/2017) 10 mL 1  . IRON PO Take by mouth.    . metroNIDAZOLE (FLAGYL) 500 MG tablet Take 1 tablet (500 mg total) by mouth 2 (two) times daily. (Patient not taking: Reported on 03/15/2017) 14 tablet 0  . ondansetron (ZOFRAN ODT) 4 MG disintegrating tablet Take 1 tablet (4 mg total) by mouth every 8 (eight) hours as needed for nausea or vomiting. (Patient not taking: Reported on 12/30/2017) 6 tablet 0  . phentermine (ADIPEX-P) 37.5 MG tablet Take 1 tablet (37.5 mg total) by mouth daily before breakfast. (Patient not taking: Reported on 12/30/2017) 30 tablet 2   Current Facility-Administered Medications on File Prior to Visit  Medication Dose Route Frequency Provider Last Rate Last Dose  . cyanocobalamin ((VITAMIN B-12)) injection 1,000 mcg  1,000 mcg Intramuscular Once Putney, [employer], CNM      . cyanocobalamin ((VITAMIN B-12)) injection 1,000 mcg  1,000 mcg Intramuscular Once Shambley, Melody N, CNM        No Known Allergies   Review of Systems Constitutional: negative for chills, fatigue, fevers and sweats Eyes: negative for irritation, redness and visual disturbance Ears, nose, mouth, throat, and face: negative for hearing loss, nasal congestion, snoring and tinnitus Respiratory: negative for asthma, cough, sputum Cardiovascular: negative for chest pain, dyspnea, exertional chest pressure/discomfort, irregular heart beat, palpitations and syncope Gastrointestinal: negative for abdominal pain, change in bowel habits, nausea and vomiting Genitourinary: positive for abnormal menstrual periods. Negative genital lesions, sexual problems and vaginal discharge, dysuria and urinary incontinence Integument/breast: negative for breast lump, breast tenderness and nipple discharge Hematologic/lymphatic: negative for bleeding and  easy bruising Musculoskeletal:negative for back pain and muscle weakness Neurological: negative for dizziness, headaches, vertigo and weakness Endocrine: negative for diabetic symptoms including polydipsia, polyuria and skin dryness Allergic/Immunologic: negative for hay fever and urticaria       Objective:  Blood pressure (!) 150/99, pulse 81, height 5\' 5"  (1.651 m), weight 191 lb 11.2 oz (87 kg). Body mass index is 31.9 kg/m.   General Appearance:    Alert, cooperative, no distress, appears stated age, mild obesity  Head:    Normocephalic, without obvious abnormality, atraumatic  Eyes:    PERRL, conjunctiva/corneas clear, EOM's intact, both eyes  Ears:    Normal external ear canals, both ears  Nose:   Nares normal, septum midline, mucosa normal, no drainage or sinus tenderness  Throat:  Lips, mucosa, and tongue normal; teeth and gums normal  Neck:   Supple, symmetrical, trachea midline, no adenopathy; thyroid: no enlargement/tenderness/nodules; no carotid bruit or JVD  Back:     Symmetric, no curvature, ROM normal, no CVA tenderness  Lungs:     Clear to auscultation bilaterally, respirations unlabored  Chest Wall:    No tenderness or deformity   Heart:    Regular rate and rhythm, S1 and S2 normal, no murmur, rub or gallop  Breast Exam:    No tenderness, masses, or nipple abnormality  Abdomen:     Soft, non-tender, bowel sounds active all four quadrants, no masses, no organomegaly.    Genitalia:    Pelvic:external genitalia normal, vagina without lesions, discharge, or tenderness, rectovaginal septum  normal. Cervix normal in appearance, no cervical motion tenderness, no adnexal masses or tenderness.  Uterus normal size, shape, mobile, regular contours, nontender.  Rectal:    Normal external sphincter.  No hemorrhoids appreciated. Internal exam not done.   Extremities:   Extremities normal, atraumatic, no cyanosis or edema  Pulses:   2+ and symmetric all extremities  Skin:   Skin color,  texture, turgor normal, no rashes or lesions  Lymph nodes:   Cervical, supraclavicular, and axillary nodes normal  Neurologic:   CNII-XII intact, normal strength, sensation and reflexes throughout   .  Labs:  Lab Results  Component Value Date   WBC 6.3 08/12/2017   HGB 13.4 08/12/2017   HCT 39.5 08/12/2017   MCV 92.5 08/12/2017   PLT 379 08/12/2017    Lab Results  Component Value Date   CREATININE 0.86 08/12/2017   BUN 10 08/12/2017   NA 138 08/12/2017   K 4.1 08/12/2017   CL 105 08/12/2017   CO2 22 08/12/2017    Lab Results  Component Value Date   ALT 13 11/04/2015   AST 16 11/04/2015   ALKPHOS 59 11/04/2015   BILITOT 0.2 11/04/2015    Lab Results  Component Value Date   TSH 0.725 08/12/2017     Assessment:    Routine gynecologic exam.   Perimenopausal abnormal bleeding  Elevated BP  Mild obesity Hypothyroidism  Plan:     Blood tests: CBC with diff, Comprehensive metabolic panel, Vitamin D, Lipoproteins and Thyroid panel with TSH. Breast self exam technique reviewed and patient encouraged to perform self-exam monthly. Contraception: tubal ligation. Discussed healthy lifestyle modifications. Mammogram up to date. Continue routine screening. Pap smear performed today. Continue routine screening.  Elevaed BP without h/o HTN, will f/u with BPs next visit. Hypothyroidism managed by PCP Discussion had regarding likely perimenopausal abnormal bleeding (metrohorrhagia). She has a normal exam, no evidence of lesions.  Will recheck TSH to evaluate status of thyroid disease as this can be a cause of abnormal bleeding.  Patient desiring to discuss hysterectomy. Briefly discussed all options for management of abnormal uterine bleeding, including  tranexamic acid (Lysteda), oral progesterone, Depo Provera, Levonogestrel IUD, endometrial ablation or hysterectomy as definitive surgical management.  Given handouts for review. Patient now considering endometrial ablation.   Once patient decides on management plan, to return for further workup with endometrial biopsy, and pelvic ultrasound if indicated.  F/u in 1 year for annual exam, sooner to discuss management of abnormal bleeding.     Rubie Maid, MD [facility]

## 2017-12-30 NOTE — Progress Notes (Signed)
Pt stated that she is doing well no complaints.  

## 2017-12-30 NOTE — Patient Instructions (Signed)

## 2017-12-31 ENCOUNTER — Encounter: Payer: Self-pay | Admitting: Obstetrics and Gynecology

## 2017-12-31 LAB — COMPREHENSIVE METABOLIC PANEL
ALBUMIN: 4.5 g/dL (ref 3.5–5.5)
ALK PHOS: 58 IU/L (ref 39–117)
ALT: 10 IU/L (ref 0–32)
AST: 14 IU/L (ref 0–40)
Albumin/Globulin Ratio: 1.9 (ref 1.2–2.2)
BILIRUBIN TOTAL: 0.3 mg/dL (ref 0.0–1.2)
BUN / CREAT RATIO: 13 (ref 9–23)
BUN: 12 mg/dL (ref 6–24)
CHLORIDE: 105 mmol/L (ref 96–106)
CO2: 23 mmol/L (ref 20–29)
Calcium: 9.4 mg/dL (ref 8.7–10.2)
Creatinine, Ser: 0.89 mg/dL (ref 0.57–1.00)
GFR calc non Af Amer: 76 mL/min/{1.73_m2} (ref >59)
GFR, EST AFRICAN AMERICAN: 87 mL/min/{1.73_m2} (ref >59)
GLOBULIN, TOTAL: 2.4 g/dL (ref 1.5–4.5)
Glucose: 85 mg/dL (ref 65–99)
Potassium: 4.7 mmol/L (ref 3.5–5.2)
Sodium: 141 mmol/L (ref 134–144)
TOTAL PROTEIN: 6.9 g/dL (ref 6.0–8.5)

## 2017-12-31 LAB — CBC
HEMATOCRIT: 37.5 % (ref 34.0–46.6)
Hemoglobin: 12.9 g/dL (ref 11.1–15.9)
MCH: 30.9 pg (ref 26.6–33.0)
MCHC: 34.4 g/dL (ref 31.5–35.7)
MCV: 90 fL (ref 79–97)
Platelets: 319 10*3/uL (ref 150–450)
RBC: 4.18 x10E6/uL (ref 3.77–5.28)
RDW: 13.5 % (ref 12.3–15.4)
WBC: 3.9 10*3/uL (ref 3.4–10.8)

## 2017-12-31 LAB — THYROID PANEL WITH TSH
FREE THYROXINE INDEX: 1.4 (ref 1.2–4.9)
T3 Uptake Ratio: 26 % (ref 24–39)
T4, Total: 5.3 ug/dL (ref 4.5–12.0)
TSH: 1.35 u[IU]/mL (ref 0.450–4.500)

## 2017-12-31 LAB — VITAMIN D 25 HYDROXY (VIT D DEFICIENCY, FRACTURES): VIT D 25 HYDROXY: 20.8 ng/mL — AB (ref 30.0–100.0)

## 2017-12-31 LAB — LIPID PANEL
CHOL/HDL RATIO: 2 ratio (ref 0.0–4.4)
Cholesterol, Total: 277 mg/dL — ABNORMAL HIGH (ref 100–199)
HDL: 138 mg/dL (ref >39)
LDL Calculated: 130 mg/dL — ABNORMAL HIGH (ref 0–99)
Triglycerides: 44 mg/dL (ref 0–149)
VLDL CHOLESTEROL CAL: 9 mg/dL (ref 5–40)

## 2018-01-03 LAB — CYTOLOGY - PAP
DIAGNOSIS: NEGATIVE
HPV: NOT DETECTED

## 2018-01-06 ENCOUNTER — Telehealth: Payer: Self-pay | Admitting: Obstetrics and Gynecology

## 2018-01-06 NOTE — Telephone Encounter (Signed)
The patient is requesting a copy of her lab work done on 12/30/17.  She was notified that her provider and nurse are out of the office today, Friday, 01/06/18, and it will be Monday before this message is received, please advise, thanks.

## 2018-01-09 NOTE — Telephone Encounter (Signed)
Pt was called twice received busy signal each time. Was unable to leave a message.

## 2018-01-09 NOTE — Telephone Encounter (Signed)
Pt's lab work was mailed on Thursday.

## 2018-01-17 ENCOUNTER — Encounter: Payer: Self-pay | Admitting: Obstetrics and Gynecology

## 2018-01-17 ENCOUNTER — Ambulatory Visit (INDEPENDENT_AMBULATORY_CARE_PROVIDER_SITE_OTHER): Payer: 59 | Admitting: Obstetrics and Gynecology

## 2018-01-17 ENCOUNTER — Other Ambulatory Visit: Payer: Self-pay | Admitting: Obstetrics and Gynecology

## 2018-01-17 ENCOUNTER — Other Ambulatory Visit (HOSPITAL_COMMUNITY)
Admission: RE | Admit: 2018-01-17 | Discharge: 2018-01-17 | Disposition: A | Discharge status: Home / Self Care | Payer: 59 | Source: Ambulatory Visit | Attending: Obstetrics and Gynecology | Admitting: Obstetrics and Gynecology

## 2018-01-17 ENCOUNTER — Other Ambulatory Visit (INDEPENDENT_AMBULATORY_CARE_PROVIDER_SITE_OTHER): Payer: 59

## 2018-01-17 VITALS — BP 123/79 | HR 92 | Ht 65.0 in | Wt 198.9 lb

## 2018-01-17 DIAGNOSIS — D250 Submucous leiomyoma of uterus: Secondary | ICD-10-CM | POA: Diagnosis not present

## 2018-01-17 DIAGNOSIS — Z01818 Encounter for other preprocedural examination: Secondary | ICD-10-CM

## 2018-01-17 DIAGNOSIS — N924 Excessive bleeding in the premenopausal period: Secondary | ICD-10-CM | POA: Insufficient documentation

## 2018-01-17 DIAGNOSIS — D251 Intramural leiomyoma of uterus: Secondary | ICD-10-CM

## 2018-01-17 DIAGNOSIS — N926 Irregular menstruation, unspecified: Secondary | ICD-10-CM

## 2018-01-17 DIAGNOSIS — N852 Hypertrophy of uterus: Secondary | ICD-10-CM

## 2018-01-17 MED ORDER — NORETHINDRONE ACETATE 5 MG PO TABS: 5.0000 mg | ORAL_TABLET | Freq: Every day | ORAL | 2 refills | Status: DC

## 2018-01-17 NOTE — Progress Notes (Unsigned)
    GYNECOLOGY PROGRESS NOTE  Subjective:    Patient ID: Ashley [name], female    DOB: 1965-09-30, 52 y.o.   MRN: [id number]  HPI  Patient is a 52 y.o. G24P1001 female who presents for   {Common ambulatory SmartLinks:19316}  Review of Systems {ros; complete:30496}   Objective:   There were no vitals taken for this visit. General appearance: {general exam:16600} Abdomen: {abdominal exam:16834} Pelvic: {pelvic exam:16852::"cervix normal in appearance","external genitalia normal","no adnexal masses or tenderness","no cervical motion tenderness","rectovaginal septum normal","uterus normal size, shape, and consistency","vagina normal without discharge"} Extremities: {extremity exam:5109} Neurologic: {neuro exam:17854}   Assessment:    Plan:     Endometrial Biopsy Procedure Note  The patient is positioned on the exam table in the dorsal lithotomy position. Bimanual exam confirms uterine position and size. A Graves speculum is placed into the vagina. A single toothed tenaculum is placed onto the anterior lip of the cervix. The pipette is placed into the endocervical canal and is advanced to the uterine fundus. Using a piston like technique, with vacuum created by withdrawing the stylus, the endometrial specimen is obtained and transferred to the biopsy container. Minimal bleeding is encountered. The procedure is well tolerated.   Uterine Position:mid anterior posterior   Uterine Length: 6 7 8 9 10  cm   Uterine Specimen: Scant Average Lush  Post procedure instructions are given. The patient is scheduled for follow up appointment.

## 2018-01-17 NOTE — H&P (View-Only) (Signed)
GYNECOLOGY PREOPERATIVE HISTORY AND PHYSICAL   Subjective:  Ashley [name] is a 52 y.o. G1P1001 here for surgical management of abnormal perimenopausal bleeding and fibroid uterus. No significant preoperative concerns.  Proposed surgery: Hysteroscopy D&C, Myosure myomectomy, Thermachoice endometrial ablation.   Pertinent Gynecological History: Menses: irregular menstrual cycles occuring at least twice monthly. Moderate flow. Mild to moderate dysmenorrhea Bleeding: dysfunctional uterine bleeding Contraception: tubal ligation Last mammogram: normal Date: 11/28/2017 Last pap: normal Date: 12/30/2017   Past Medical History:  Diagnosis Date  . Hypothyroidism   . Thyroid enlarged    pt reports "being watched"   Past Surgical History:  Procedure Laterality Date  . COLONOSCOPY WITH PROPOFOL N/A 12/26/2015   Procedure: COLONOSCOPY WITH PROPOFOL;  Surgeon: Lucilla Lame, MD;  Location: [place];  Service: Endoscopy;  Laterality: N/A;  . EXCISION MASS NECK Right 05/27/2015   Procedure: EXCISION MASS NECK RIGHT LIPOMA;  Surgeon: Clyde Canterbury, MD;  Location: [place];  Service: ENT;  Laterality: Right;  . TONSILLECTOMY    . TUBAL LIGATION     OB History  Gravida Para Term Preterm AB Living  1 1 1     1   SAB TAB Ectopic Multiple Live Births          1    # Outcome Date GA Lbr Len/2nd Weight Sex Delivery Anes PTL Lv  1 Term 1992 [redacted]w[redacted]d  6 lb 7 oz (2.92 kg) F Vag-Spont   LIV  Patient denies any other pertinent gynecologic issues.  Family History  Problem Relation Age of Onset  . Hypertension Mother   . Hypertension Father   . Diabetes Father    Social History   Socioeconomic History  . Marital status: Single    Spouse name: Not on file  . Number of children: Not on file  . Years of education: Not on file  . Highest education level: Not on file  Occupational History  . Not on file  Social Needs  . Financial resource strain: Not on file  . Food  insecurity:    Worry: Not on file    Inability: Not on file  . Transportation needs:    Medical: Not on file    Non-medical: Not on file  Tobacco Use  . Smoking status: Never Smoker  . Smokeless tobacco: Never Used  Substance and Sexual Activity  . Alcohol use: No  . Drug use: No  . Sexual activity: Yes    Birth control/protection: None, Surgical  Lifestyle  . Physical activity:    Days per week: Not on file    Minutes per session: Not on file  . Stress: Not on file  Relationships  . Social connections:    Talks on phone: Not on file    Gets together: Not on file    Attends religious service: Not on file    Active member of club or organization: Not on file    Attends meetings of clubs or organizations: Not on file    Relationship status: Not on file  . Intimate partner violence:    Fear of current or ex partner: Not on file    Emotionally abused: Not on file    Physically abused: Not on file    Forced sexual activity: Not on file  Other Topics Concern  . Not on file  Social History Narrative  . Not on file   Current Outpatient Medications on File Prior to Visit  Medication Sig Dispense Refill  . BIOTIN  PO Take by mouth.    . IRON PO Take by mouth.    . levothyroxine (SYNTHROID) 25 MCG tablet Take 25 mcg by mouth daily before breakfast.    . loratadine (CLARITIN) 10 MG tablet Take 10 mg by mouth daily.    . Multiple Vitamins-Minerals (RA ONE DAILY GUMMY VITES PO) Take by mouth.     Current Facility-Administered Medications on File Prior to Visit  Medication Dose Route Frequency Provider Last Rate Last Dose  . cyanocobalamin ((VITAMIN B-12)) injection 1,000 mcg  1,000 mcg Intramuscular Once Clarita, [employer], CNM      . cyanocobalamin ((VITAMIN B-12)) injection 1,000 mcg  1,000 mcg Intramuscular Once Shambley, Melody N, CNM       No Known Allergies   Review of Systems Constitutional: No recent fever/chills/sweats Respiratory: No recent  cough/bronchitis Cardiovascular: No chest pain Gastrointestinal: No recent nausea/vomiting/diarrhea Genitourinary: No UTI symptoms Hematologic/lymphatic:No history of coagulopathy or recent blood thinner use    Objective:   Blood pressure 123/79, pulse 92, height 5\' 5"  (1.651 m), weight 198 lb 14.4 oz (90.2 kg), last menstrual period 12/15/2017. CONSTITUTIONAL: Well-developed, well-nourished female in no acute distress.  HENT:  Normocephalic, atraumatic, External right and left ear normal. Oropharynx is clear and moist EYES: Conjunctivae and EOM are normal. Pupils are equal, round, and reactive to light. No scleral icterus.  NECK: Normal range of motion, supple, no masses SKIN: Skin is warm and dry. No rash noted. Not diaphoretic. No erythema. No pallor. NEUROLOGIC: Alert and oriented to person, place, and time. Normal reflexes, muscle tone coordination. No cranial nerve deficit noted. PSYCHIATRIC: Normal mood and affect. Normal behavior. Normal judgment and thought content. CARDIOVASCULAR: Normal heart rate noted, regular rhythm RESPIRATORY: Effort and breath sounds normal, no problems with respiration noted ABDOMEN: Soft, nontender, nondistended. PELVIC: Deferred MUSCULOSKELETAL: Normal range of motion. No edema and no tenderness. 2+ distal pulses.    Labs: Lab Results  Component Value Date   WBC 3.9 12/30/2017   HGB 12.9 12/30/2017   HCT 37.5 12/30/2017   MCV 90 12/30/2017   PLT 319 12/30/2017   Lab Results  Component Value Date   TSH 1.350 12/30/2017    Endometrial biopsy results pending (performed 01/17/18)   Imaging Studies: US Transvaginal Non-ob  Result Date: 01/17/2018 ULTRASOUND REPORT Location: [facility] Date of Service:  01/17/2018 Indications: Irregular menses Findings: The uterus measures 12.7 x 8.3 x 7.8 cm. Echo texture is heterogeneous with evidence of focal masses. Within the uterus are multiple suspected fibroids measuring: Fibroid 1:  Anterior, LUS, Intramural, 4.2 x 3.3 x 4.1 cm Fibroid 2: Left uterus, appears to be invading the endometrium, Submucosal, 3.0 x 3.6 x 3.4 cm Fibroid 3: Right uterus, Anterior, Intramural, 2.5 x 2.8 x 2.1 cm The Endometrium measures approximately 12.5 mm. Right Ovary measures 2.8 x 2.3 x 1.6 cm. It is normal in appearance. Left Ovary was not visualized. Survey of the adnexa demonstrates no adnexal masses. There is no free fluid in the cul de sac. Impression: 1. Fibroid uterus 2. Multiple fibroids noted throughout uterus - 3 most dominant measured, 1 of which appears to be submucosal invading the endometrium. 3. Right ovary WNL.  Left ovary was not visualized. Recommendations: 1.Clinical correlation with the patient's History and Physical Exam. Dario Ave, RDMS I have reviewed this study and agree with documented findings. Rubie Maid, MD [facility]     Assessment:    Perimenopausal menopausal bleeding Fibroid uterus  Plan:    Counseling:  Procedure, risks, reasons, benefits and complications (including injury to bowel, bladder, major blood vessel, ureter, bleeding, possibility of transfusion, infection, or fistula formation) reviewed in detail. Likelihood of success in alleviating the patient's condition was discussed. Routine postoperative instructions will be reviewed with the patient and her family in detail after surgery.  The patient concurred with the proposed plan, giving informed written consent for the surgery.   Preop testing ordered. Instructions reviewed, including NPO after midnight.      Rubie Maid, MD [facility]

## 2018-01-17 NOTE — Progress Notes (Signed)
GYNECOLOGY PROGRESS NOTE  Subjective:    Patient ID: Ashley [name], female    DOB: [birth year], 52 y.o.   MRN: [id number]  HPI  Patient is a 52 y.o. G84P1001 female who presents for discussion of surgical intervention for likely perimenopausal abnormal uterine bleeding.  Patient notes that since she was last seen, she has had another bout of heavy vaginal bleeding that lasted 5 days.  Denies pelvic pain but does report some intermittent cramping. Is currently wanting to proceed with endometrial ablation.   The following portions of the patient's history were reviewed and updated as appropriate: allergies, current medications, past family history, past medical history, past social history, past surgical history and problem list.  Review of Systems Pertinent items noted in HPI and remainder of comprehensive ROS otherwise negative.   Objective:   Blood pressure 123/79, pulse 92, height 5\' 5"  (1.651 m), weight 198 lb 14.4 oz (90.2 kg), last menstrual period 12/15/2017. General appearance: alert and no distress Abdomen: soft, non-tender; bowel sounds normal; no masses,  no organomegaly Pelvic: external genitalia normal, rectovaginal septum normal.  Vagina with small amount of thin white discharge, no odor.  Cervix normal appearing, no lesions and no motion tenderness.  Uterus mobile, nontender, normal shape, but upper limits of normal in size (~ 12 cm).  Adnexae non-palpable, nontender bilaterally.  Extremities: extremities normal, atraumatic, no cyanosis or edema Neurologic: Grossly normal    Imaging:  US Transvaginal Non-OB ULTRASOUND REPORT  Location: [facility] Date of Service:  01/17/2018  Indications: Irregular menses Findings:  The uterus measures 12.7 x 8.3 x 7.8 cm. Echo texture is heterogeneous with evidence of focal masses. Within the uterus are multiple suspected fibroids measuring: Fibroid 1: Anterior, LUS, Intramural, 4.2 x 3.3 x 4.1 cm Fibroid 2: Left  uterus, appears to be invading the endometrium,  Submucosal, 3.0 x 3.6 x 3.4 cm Fibroid 3: Right uterus, Anterior, Intramural, 2.5 x 2.8 x 2.1 cm The Endometrium measures approximately 12.5 mm.  Right Ovary measures 2.8 x 2.3 x 1.6 cm. It is normal in appearance. Left Ovary was not visualized. Survey of the adnexa demonstrates no adnexal masses. There is no free fluid in the cul de sac.  Impression: 1. Fibroid uterus 2. Multiple fibroids noted throughout uterus - 3 most dominant measured, 1  of which appears to be submucosal invading the endometrium. 3. Right ovary WNL.  Left ovary was not visualized.  Recommendations: 1.Clinical correlation with the patient's History and Physical Exam.  Dario Ave, RDMS  I have reviewed this study and agree with documented findings.   Rubie Maid, MD [facility]   Assessment:   Abnormal perimenopausal bleeding Uterine enlargement (mild)  Plan:   Patient desires surgical management with endometrial ablation.  The risks of surgery were discussed in detail with the patient including but not limited to: bleeding which may require transfusion or reoperation; infection which may require prolonged hospitalization or re-hospitalization and antibiotic therapy; injury to bowel, bladder, ureters and major vessels or other surrounding organs; need for additional procedures including laparotomy; thromboembolic phenomenon, incisional problems and other postoperative or anesthesia complications.  Patient was told that the likelihood that her condition and symptoms will be treated effectively with this surgical management was very high; the postoperative expectations were also discussed in detail. The patient also understands the alternative treatment options which were discussed in full. All questions were answered.  She was told that she will be contacted by our surgical scheduler regarding the  time and date of her surgery; routine preoperative  instructions of having nothing to eat or drink after midnight on the day prior to surgery and also coming to the hospital 1.5 hours prior to her time of surgery were also emphasized.  She was told she may be called for a preoperative appointment about a week prior to surgery and will be given further preoperative instructions at that visit. Printed patient education handouts about the procedure were given to the patient to review at home.  Surgery to be scheduled for 02/13/2018.  - Ultrasound ordered today to assess for any structural causes of bleeding, including polyps, fibroids.  Submucosal and intramural fibroids present. Can perfom Myosure removal of massat time of surgery.  - Endometrial biopsy today performed as workup of abnormal bleeding (see procedure note below).  Will inform of results by phone. Will prescribe Aygestin for bleeding until surgery.     Endometrial Biopsy Procedure Note  The patient is positioned on the exam table in the dorsal lithotomy position. Bimanual exam confirms uterine position and size. A Graves speculum is placed into the vagina. A single toothed tenaculum is placed onto the anterior lip of the cervix. The pipette is placed into the endocervical canal and is advanced to the uterine fundus. Using a piston like technique, with vacuum created by withdrawing the stylus, the endometrial specimen is obtained and transferred to the biopsy container. Minimal bleeding is encountered. The procedure is well tolerated.   Uterine Position: mid    Uterine Length: 12 cm   Uterine Specimen: Average   Post procedure instructions are given.    A total of 15 minutes were spent face-to-face with the patient during this encounter and over half of that time dealt with counseling and coordination of care.   Rubie Maid, MD [facility]

## 2018-01-17 NOTE — Progress Notes (Signed)
Pt stated that she was doing well present to discuss surgery for abnormal/irregular cycles.

## 2018-01-17 NOTE — H&P (Signed)
GYNECOLOGY PREOPERATIVE HISTORY AND PHYSICAL   Subjective:  Ashley [name] is a 52 y.o. G1P1001 here for surgical management of abnormal perimenopausal bleeding and fibroid uterus. No significant preoperative concerns.  Proposed surgery: Hysteroscopy D&C, Myosure myomectomy, Thermachoice endometrial ablation.   Pertinent Gynecological History: Menses: irregular menstrual cycles occuring at least twice monthly. Moderate flow. Mild to moderate dysmenorrhea Bleeding: dysfunctional uterine bleeding Contraception: tubal ligation Last mammogram: normal Date: 11/28/2017 Last pap: normal Date: 12/30/2017   Past Medical History:  Diagnosis Date  . Hypothyroidism   . Thyroid enlarged    pt reports "being watched"   Past Surgical History:  Procedure Laterality Date  . COLONOSCOPY WITH PROPOFOL N/A 12/26/2015   Procedure: COLONOSCOPY WITH PROPOFOL;  Surgeon: Lucilla Lame, MD;  Location: [employer];  Service: Endoscopy;  Laterality: N/A;  . EXCISION MASS NECK Right 05/27/2015   Procedure: EXCISION MASS NECK RIGHT LIPOMA;  Surgeon: Clyde Canterbury, MD;  Location: [place];  Service: ENT;  Laterality: Right;  . TONSILLECTOMY    . TUBAL LIGATION     OB History  Gravida Para Term Preterm AB Living  1 1 1     1   SAB TAB Ectopic Multiple Live Births          1    # Outcome Date GA Lbr Len/2nd Weight Sex Delivery Anes PTL Lv  1 Term 1992 [redacted]w[redacted]d  6 lb 7 oz (2.92 kg) F Vag-Spont   LIV  Patient denies any other pertinent gynecologic issues.  Family History  Problem Relation Age of Onset  . Hypertension Mother   . Hypertension Father   . Diabetes Father    Social History   Socioeconomic History  . Marital status: Single    Spouse name: Not on file  . Number of children: Not on file  . Years of education: Not on file  . Highest education level: Not on file  Occupational History  . Not on file  Social Needs  . Financial resource strain: Not on file  . Food  insecurity:    Worry: Not on file    Inability: Not on file  . Transportation needs:    Medical: Not on file    Non-medical: Not on file  Tobacco Use  . Smoking status: Never Smoker  . Smokeless tobacco: Never Used  Substance and Sexual Activity  . Alcohol use: No  . Drug use: No  . Sexual activity: Yes    Birth control/protection: None, Surgical  Lifestyle  . Physical activity:    Days per week: Not on file    Minutes per session: Not on file  . Stress: Not on file  Relationships  . Social connections:    Talks on phone: Not on file    Gets together: Not on file    Attends religious service: Not on file    Active member of club or organization: Not on file    Attends meetings of clubs or organizations: Not on file    Relationship status: Not on file  . Intimate partner violence:    Fear of current or ex partner: Not on file    Emotionally abused: Not on file    Physically abused: Not on file    Forced sexual activity: Not on file  Other Topics Concern  . Not on file  Social History Narrative  . Not on file   Current Outpatient Medications on File Prior to Visit  Medication Sig Dispense Refill  . BIOTIN  PO Take by mouth.    . IRON PO Take by mouth.    . levothyroxine (SYNTHROID) 25 MCG tablet Take 25 mcg by mouth daily before breakfast.    . loratadine (CLARITIN) 10 MG tablet Take 10 mg by mouth daily.    . Multiple Vitamins-Minerals (RA ONE DAILY GUMMY VITES PO) Take by mouth.     Current Facility-Administered Medications on File Prior to Visit  Medication Dose Route Frequency Provider Last Rate Last Dose  . cyanocobalamin ((VITAMIN B-12)) injection 1,000 mcg  1,000 mcg Intramuscular Once Coates, [employer], CNM      . cyanocobalamin ((VITAMIN B-12)) injection 1,000 mcg  1,000 mcg Intramuscular Once Shambley, Melody N, CNM       No Known Allergies   Review of Systems Constitutional: No recent fever/chills/sweats Respiratory: No recent  cough/bronchitis Cardiovascular: No chest pain Gastrointestinal: No recent nausea/vomiting/diarrhea Genitourinary: No UTI symptoms Hematologic/lymphatic:No history of coagulopathy or recent blood thinner use    Objective:   Blood pressure 123/79, pulse 92, height 5\' 5"  (1.651 m), weight 198 lb 14.4 oz (90.2 kg), last menstrual period 12/15/2017. CONSTITUTIONAL: Well-developed, well-nourished female in no acute distress.  HENT:  Normocephalic, atraumatic, External right and left ear normal. Oropharynx is clear and moist EYES: Conjunctivae and EOM are normal. Pupils are equal, round, and reactive to light. No scleral icterus.  NECK: Normal range of motion, supple, no masses SKIN: Skin is warm and dry. No rash noted. Not diaphoretic. No erythema. No pallor. NEUROLOGIC: Alert and oriented to person, place, and time. Normal reflexes, muscle tone coordination. No cranial nerve deficit noted. PSYCHIATRIC: Normal mood and affect. Normal behavior. Normal judgment and thought content. CARDIOVASCULAR: Normal heart rate noted, regular rhythm RESPIRATORY: Effort and breath sounds normal, no problems with respiration noted ABDOMEN: Soft, nontender, nondistended. PELVIC: Deferred MUSCULOSKELETAL: Normal range of motion. No edema and no tenderness. 2+ distal pulses.    Labs: Lab Results  Component Value Date   WBC 3.9 12/30/2017   HGB 12.9 12/30/2017   HCT 37.5 12/30/2017   MCV 90 12/30/2017   PLT 319 12/30/2017   Lab Results  Component Value Date   TSH 1.350 12/30/2017    Endometrial biopsy results pending (performed 01/17/18)   Imaging Studies: US Transvaginal Non-ob  Result Date: 01/17/2018 ULTRASOUND REPORT Location: [facility] Date of Service:  01/17/2018 Indications: Irregular menses Findings: The uterus measures 12.7 x 8.3 x 7.8 cm. Echo texture is heterogeneous with evidence of focal masses. Within the uterus are multiple suspected fibroids measuring: Fibroid 1:  Anterior, LUS, Intramural, 4.2 x 3.3 x 4.1 cm Fibroid 2: Left uterus, appears to be invading the endometrium, Submucosal, 3.0 x 3.6 x 3.4 cm Fibroid 3: Right uterus, Anterior, Intramural, 2.5 x 2.8 x 2.1 cm The Endometrium measures approximately 12.5 mm. Right Ovary measures 2.8 x 2.3 x 1.6 cm. It is normal in appearance. Left Ovary was not visualized. Survey of the adnexa demonstrates no adnexal masses. There is no free fluid in the cul de sac. Impression: 1. Fibroid uterus 2. Multiple fibroids noted throughout uterus - 3 most dominant measured, 1 of which appears to be submucosal invading the endometrium. 3. Right ovary WNL.  Left ovary was not visualized. Recommendations: 1.Clinical correlation with the patient's History and Physical Exam. Dario Ave, RDMS I have reviewed this study and agree with documented findings. Rubie Maid, MD [facility]     Assessment:    Perimenopausal menopausal bleeding Fibroid uterus  Plan:    Counseling:  Procedure, risks, reasons, benefits and complications (including injury to bowel, bladder, major blood vessel, ureter, bleeding, possibility of transfusion, infection, or fistula formation) reviewed in detail. Likelihood of success in alleviating the patient's condition was discussed. Routine postoperative instructions will be reviewed with the patient and her family in detail after surgery.  The patient concurred with the proposed plan, giving informed written consent for the surgery.   Preop testing ordered. Instructions reviewed, including NPO after midnight.      Rubie Maid, MD [facility]

## 2018-02-03 ENCOUNTER — Telehealth: Payer: Self-pay | Admitting: Obstetrics and Gynecology

## 2018-02-03 NOTE — Telephone Encounter (Signed)
The patient stated that since she had her cultures her issue has been worse, The patient informed me that she is experiencing severe cramping and has taken a full bottle of extra strength tylenol. THe patient would like to speak with a nurse as soon as possible. The patient was previously taking doxycycline (VIBRA-TABS) 100 MG tablet as well. No other information was disclosed. Please advise.

## 2018-02-03 NOTE — Telephone Encounter (Signed)
Pt has been on aygestin for aub since 01/17/2018. She states she has been bleeding since taking it. Changing q 1h. She is also having intense cramps. Pt is scheduled for hysteroscopy and d&c. myosure and ablation  on 8/19. Advised pt to stop agyestin for now. Will send message to [facility]. Pt aware she will get this message on 8/13. Advised pt to take a MVI. If sx gets worse to go to ED. Pt voices understanding.

## 2018-02-03 NOTE — Telephone Encounter (Signed)
VM not set up.  Unable to leave message.

## 2018-02-06 ENCOUNTER — Encounter
Admission: RE | Admit: 2018-02-06 | Discharge: 2018-02-06 | Disposition: A | Discharge status: Home / Self Care | Payer: 59 | Source: Ambulatory Visit | Attending: Obstetrics and Gynecology | Admitting: Obstetrics and Gynecology

## 2018-02-06 ENCOUNTER — Other Ambulatory Visit: Payer: Self-pay

## 2018-02-06 NOTE — Patient Instructions (Signed)
Your procedure is scheduled on: 02-13-18 MONDAY Report to Same Day Surgery 2nd floor medical mall [facility] Entrance-take elevator on left to 2nd floor.  Check in with surgery information desk.) To find out your arrival time please call [phone] between 1PM - 3PM on 02-10-18 FRIDAY  Remember: Instructions that are not followed completely may result in serious medical risk, up to and including death, or upon the discretion of your surgeon and anesthesiologist your surgery may need to be rescheduled.    _x___ 1. Do not eat food after midnight the night before your procedure. NO GUM OR CANDY AFTER MIDNIGHT. You may drink clear liquids up to 2 hours before you are scheduled to arrive at the hospital for your procedure.  Do not drink clear liquids within 2 hours of your scheduled arrival to the hospital.  Clear liquids include  --Water or Apple juice without pulp  --Clear carbohydrate beverage such as ClearFast or Gatorade  --Black Coffee or Clear Tea (No milk, no creamers, do not add anything to the coffee or Tea Type 1 and type 2 diabetics should only drink water.   ____Ensure clear carbohydrate drink on the way to the hospital for bariatric patients  ____Ensure clear carbohydrate drink 3 hours before surgery for Dr Dwyane Luo patients if physician instructed.   No gum chewing or hard candies.     __x__ 2. No Alcohol for 24 hours before or after surgery.   __x__3. No Smoking or e-cigarettes for 24 prior to surgery.  Do not use any chewable tobacco products for at least 6 hour prior to surgery   ____  4. Bring all medications with you on the day of surgery if instructed.    __x__ 5. Notify your doctor if there is any change in your medical condition     (cold, fever, infections).    x___6. On the morning of surgery brush your teeth with toothpaste and water.  You may rinse your mouth with mouth wash if you wish.  Do not swallow any toothpaste or mouthwash.   Do not wear jewelry,  make-up, hairpins, clips or nail polish.  Do not wear lotions, powders, or perfumes. You may wear deodorant.  Do not shave 48 hours prior to surgery. Men may shave face and neck.  Do not bring valuables to the hospital.    [facility] is not responsible for any belongings or valuables.               Contacts, dentures or bridgework may not be worn into surgery.  Leave your suitcase in the car. After surgery it may be brought to your room.  For patients admitted to the hospital, discharge time is determined by your treatment team.  _  Patients discharged the day of surgery will not be allowed to drive home.  You will need someone to drive you home and stay with you the night of your procedure.    Please read over the following fact sheets that you were given:   [facility] Preparing for Surgery  _x___ TAKE THE FOLLOWING MEDICATION THE MORNING OF SURGERY WITH A SMALL SIP OF WATER. These include:  1. LEVOTHYROXINE  2.  3.  4.  5.  6.  ____Fleets enema or Magnesium Citrate as directed.   ____ Use CHG Soap or sage wipes as directed on instruction sheet   ____ Use inhalers on the day of surgery and bring to hospital day of surgery  ____ Stop Metformin and Janumet 2 days  prior to surgery.    ____ Take 1/2 of usual insulin dose the night before surgery and none on the morning surgery.   ____ Follow recommendations from Cardiologist, Pulmonologist or PCP regarding stopping Aspirin, Coumadin, Plavix ,Eliquis, Effient, or Pradaxa, and Pletal.  X____Stop Anti-inflammatories such as Advil, Aleve, Ibuprofen, Motrin, Naproxen, Naprosyn, Goodies powders or aspirin products NOW-OK to take Tylenol    _x___ Stop supplements until after surgery-STOP [place]   ____ Bring C-Pap to the hospital.

## 2018-02-07 NOTE — Telephone Encounter (Signed)
Please follow up to see if the patient's symptoms have improved or worsened.   Dr. Marcelline Mates

## 2018-02-07 NOTE — Telephone Encounter (Signed)
Pt was called and stated that she was doing a lot better now.

## 2018-02-09 ENCOUNTER — Encounter
Admission: RE | Admit: 2018-02-09 | Discharge: 2018-02-09 | Disposition: A | Discharge status: Home / Self Care | Payer: 59 | Source: Ambulatory Visit | Attending: Obstetrics and Gynecology | Admitting: Obstetrics and Gynecology

## 2018-02-09 DIAGNOSIS — D251 Intramural leiomyoma of uterus: Secondary | ICD-10-CM | POA: Diagnosis not present

## 2018-02-09 DIAGNOSIS — D250 Submucous leiomyoma of uterus: Secondary | ICD-10-CM | POA: Insufficient documentation

## 2018-02-09 DIAGNOSIS — N852 Hypertrophy of uterus: Secondary | ICD-10-CM | POA: Insufficient documentation

## 2018-02-09 DIAGNOSIS — Z01818 Encounter for other preprocedural examination: Secondary | ICD-10-CM | POA: Insufficient documentation

## 2018-02-09 DIAGNOSIS — N924 Excessive bleeding in the premenopausal period: Secondary | ICD-10-CM | POA: Diagnosis not present

## 2018-02-09 LAB — CBC
HCT: 39.8 % (ref 35.0–47.0)
Hemoglobin: 13.6 g/dL (ref 12.0–16.0)
MCH: 32.2 pg (ref 26.0–34.0)
MCHC: 34.2 g/dL (ref 32.0–36.0)
MCV: 94 fL (ref 80.0–100.0)
PLATELETS: 305 10*3/uL (ref 150–440)
RBC: 4.24 MIL/uL (ref 3.80–5.20)
RDW: 14.4 % (ref 11.5–14.5)
WBC: 5.4 10*3/uL (ref 3.6–11.0)

## 2018-02-13 ENCOUNTER — Encounter: Payer: Self-pay | Admitting: *Deleted

## 2018-02-13 ENCOUNTER — Encounter
Admission: RE | Disposition: A | Discharge status: Home / Self Care | Payer: Self-pay | Source: Ambulatory Visit | Attending: Obstetrics and Gynecology

## 2018-02-13 ENCOUNTER — Ambulatory Visit
Admission: RE | Admit: 2018-02-13 | Discharge: 2018-02-13 | Disposition: A | Discharge status: Home / Self Care | Payer: 59 | Source: Ambulatory Visit | Attending: Obstetrics and Gynecology | Admitting: Obstetrics and Gynecology

## 2018-02-13 ENCOUNTER — Ambulatory Visit: Payer: 59 | Admitting: Anesthesiology

## 2018-02-13 ENCOUNTER — Other Ambulatory Visit: Payer: Self-pay

## 2018-02-13 DIAGNOSIS — D259 Leiomyoma of uterus, unspecified: Secondary | ICD-10-CM

## 2018-02-13 DIAGNOSIS — Z79890 Hormone replacement therapy (postmenopausal): Secondary | ICD-10-CM | POA: Diagnosis not present

## 2018-02-13 DIAGNOSIS — N924 Excessive bleeding in the premenopausal period: Secondary | ICD-10-CM | POA: Insufficient documentation

## 2018-02-13 DIAGNOSIS — Z79899 Other long term (current) drug therapy: Secondary | ICD-10-CM | POA: Diagnosis not present

## 2018-02-13 DIAGNOSIS — E039 Hypothyroidism, unspecified: Secondary | ICD-10-CM | POA: Diagnosis not present

## 2018-02-13 DIAGNOSIS — N840 Polyp of corpus uteri: Secondary | ICD-10-CM | POA: Insufficient documentation

## 2018-02-13 DIAGNOSIS — D250 Submucous leiomyoma of uterus: Secondary | ICD-10-CM | POA: Diagnosis not present

## 2018-02-13 DIAGNOSIS — D251 Intramural leiomyoma of uterus: Secondary | ICD-10-CM | POA: Insufficient documentation

## 2018-02-13 DIAGNOSIS — Z9889 Other specified postprocedural states: Secondary | ICD-10-CM

## 2018-02-13 HISTORY — PX: DILATATION & CURETTAGE/HYSTEROSCOPY WITH MYOSURE: SHX6511

## 2018-02-13 LAB — POCT PREGNANCY, URINE: PREG TEST UR: NEGATIVE

## 2018-02-13 SURGERY — DILATATION & CURETTAGE/HYSTEROSCOPY WITH MYOSURE
Anesthesia: General | Site: Vagina | Wound class: Clean Contaminated

## 2018-02-13 MED ORDER — FENTANYL CITRATE (PF) 100 MCG/2ML IJ SOLN
INTRAMUSCULAR | Status: AC
  Filled 2018-02-13: qty 2

## 2018-02-13 MED ORDER — PROPOFOL 10 MG/ML IV BOLUS
INTRAVENOUS | Status: DC | PRN
  Administered 2018-02-13: 180 mg via INTRAVENOUS

## 2018-02-13 MED ORDER — KETOROLAC TROMETHAMINE 30 MG/ML IJ SOLN
INTRAMUSCULAR | Status: DC | PRN
  Administered 2018-02-13: 30 mg via INTRAVENOUS

## 2018-02-13 MED ORDER — FENTANYL CITRATE (PF) 100 MCG/2ML IJ SOLN
INTRAMUSCULAR | Status: DC | PRN
  Administered 2018-02-13 (×2): 25 ug via INTRAVENOUS
  Administered 2018-02-13 (×3): 50 ug via INTRAVENOUS

## 2018-02-13 MED ORDER — OXYCODONE HCL 5 MG PO TABS
5.0000 mg | ORAL_TABLET | Freq: Once | ORAL | Status: AC | PRN
  Administered 2018-02-13: 5 mg via ORAL

## 2018-02-13 MED ORDER — MIDAZOLAM HCL 2 MG/2ML IJ SOLN
INTRAMUSCULAR | Status: AC
  Filled 2018-02-13: qty 2

## 2018-02-13 MED ORDER — FAMOTIDINE 20 MG PO TABS
20.0000 mg | ORAL_TABLET | Freq: Once | ORAL | Status: AC
  Administered 2018-02-13: 20 mg via ORAL

## 2018-02-13 MED ORDER — FENTANYL CITRATE (PF) 100 MCG/2ML IJ SOLN
INTRAMUSCULAR | Status: AC
  Administered 2018-02-13: 25 ug via INTRAVENOUS
  Filled 2018-02-13: qty 2

## 2018-02-13 MED ORDER — FAMOTIDINE 20 MG PO TABS
ORAL_TABLET | ORAL | Status: AC
  Filled 2018-02-13: qty 1

## 2018-02-13 MED ORDER — OXYCODONE HCL 5 MG PO TABS
ORAL_TABLET | ORAL | Status: AC
  Administered 2018-02-13: 5 mg via ORAL
  Filled 2018-02-13: qty 1

## 2018-02-13 MED ORDER — SEVOFLURANE IN SOLN
RESPIRATORY_TRACT | Status: AC
  Filled 2018-02-13: qty 250

## 2018-02-13 MED ORDER — LACTATED RINGERS IV SOLN: INTRAVENOUS | Status: DC

## 2018-02-13 MED ORDER — FENTANYL CITRATE (PF) 100 MCG/2ML IJ SOLN
25.0000 ug | INTRAMUSCULAR | Status: DC | PRN
  Administered 2018-02-13 (×2): 25 ug via INTRAVENOUS
  Administered 2018-02-13: 50 ug via INTRAVENOUS

## 2018-02-13 MED ORDER — LIDOCAINE HCL (CARDIAC) PF 100 MG/5ML IV SOSY
PREFILLED_SYRINGE | INTRAVENOUS | Status: DC | PRN
  Administered 2018-02-13: 50 mg via INTRAVENOUS

## 2018-02-13 MED ORDER — OXYCODONE HCL 5 MG/5ML PO SOLN: 5.0000 mg | Freq: Once | ORAL | Status: AC | PRN

## 2018-02-13 MED ORDER — ONDANSETRON HCL 4 MG/2ML IJ SOLN
INTRAMUSCULAR | Status: DC | PRN
  Administered 2018-02-13: 4 mg via INTRAVENOUS

## 2018-02-13 MED ORDER — MIDAZOLAM HCL 2 MG/2ML IJ SOLN
INTRAMUSCULAR | Status: DC | PRN
  Administered 2018-02-13: 2 mg via INTRAVENOUS

## 2018-02-13 MED ORDER — IBUPROFEN 800 MG PO TABS: 800.0000 mg | ORAL_TABLET | Freq: Three times a day (TID) | ORAL | 1 refills | Status: DC | PRN

## 2018-02-13 MED ORDER — DEXAMETHASONE SODIUM PHOSPHATE 10 MG/ML IJ SOLN
INTRAMUSCULAR | Status: DC | PRN
  Administered 2018-02-13: 10 mg via INTRAVENOUS

## 2018-02-13 MED ORDER — LACTATED RINGERS IV SOLN
INTRAVENOUS | Status: DC
  Administered 2018-02-13: 15:00:00 via INTRAVENOUS

## 2018-02-13 SURGICAL SUPPLY — 17 items
CANISTER SUC SOCK COL 7IN (MISCELLANEOUS) ×2 IMPLANT
CATH ROBINSON RED A/P 16FR (CATHETERS) ×2 IMPLANT
DEVICE MYOSURE LITE (MISCELLANEOUS) ×2 IMPLANT
ELECT REM PT RETURN 9FT ADLT (ELECTROSURGICAL) ×2
ELECTRODE REM PT RTRN 9FT ADLT (ELECTROSURGICAL) ×1 IMPLANT
GLOVE BIO SURGEON STRL SZ 6.5 (GLOVE) ×2 IMPLANT
GOWN STRL REUS W/ TWL LRG LVL3 (GOWN DISPOSABLE) ×2 IMPLANT
GOWN STRL REUS W/TWL LRG LVL3 (GOWN DISPOSABLE) ×2
KIT TURNOVER CYSTO (KITS) ×2 IMPLANT
PACK DNC HYST (MISCELLANEOUS) ×2 IMPLANT
PAD OB MATERNITY 4.3X12.25 (PERSONAL CARE ITEMS) ×2 IMPLANT
PAD PREP 24X41 OB/GYN DISP (PERSONAL CARE ITEMS) ×2 IMPLANT
SET GENESYS HTA PROCERVA (MISCELLANEOUS) ×2 IMPLANT
SOL .9 NS 3000ML IRR  AL (IV SOLUTION) ×3
SOL .9 NS 3000ML IRR UROMATIC (IV SOLUTION) ×3 IMPLANT
TUBING CONNECTING 10 (TUBING) ×2 IMPLANT
TUBING HYSTEROSCOPY DOLPHIN (MISCELLANEOUS) ×2 IMPLANT

## 2018-02-13 NOTE — Anesthesia Postprocedure Evaluation (Signed)
Anesthesia Post Note  Patient: Ashley [name]  Procedure(s) Performed: DILATATION & CURETTAGE/HYSTEROSCOPY WITH GENESYSN HTA THERMAL ABLATION (N/A Vagina )  Patient location during evaluation: PACU Anesthesia Type: General Level of consciousness: awake and alert Pain management: pain level controlled Vital Signs Assessment: post-procedure vital signs reviewed and stable Respiratory status: spontaneous breathing, nonlabored ventilation, respiratory function stable and patient connected to nasal cannula oxygen Cardiovascular status: blood pressure returned to baseline and stable Postop Assessment: no apparent nausea or vomiting Anesthetic complications: no     Last Vitals:  Vitals:   02/13/18 1917 02/13/18 1946  BP: (!) 146/75 (!) 153/80  Pulse: 88 81  Resp:  16  Temp:    SpO2: 94% 98%    Last Pain:  Vitals:   02/13/18 1946  TempSrc:   PainSc: 4                  Martha Clan

## 2018-02-13 NOTE — Transfer of Care (Signed)
Immediate Anesthesia Transfer of Care Note  Patient: Ashley [name]  Procedure(s) Performed: DILATATION & CURETTAGE/HYSTEROSCOPY WITH GENESYSN HTA THERMAL ABLATION (N/A Vagina )  Patient Location: PACU  Anesthesia Type:General  Level of Consciousness: sedated  Airway & Oxygen Therapy: Patient connected to face mask oxygen  Post-op Assessment: Post -op Vital signs reviewed and stable  Post vital signs: stable  Last Vitals:  Vitals Value Taken Time  BP 129/97 02/13/2018  6:02 PM  Temp 36.1 C 02/13/2018  6:02 PM  Pulse 91 02/13/2018  6:02 PM  Resp 16 02/13/2018  6:02 PM  SpO2 100 % 02/13/2018  6:02 PM  Vitals shown include unvalidated device data.  Last Pain:  Vitals:   02/13/18 1802  TempSrc: Temporal  PainSc:          Complications: No apparent anesthesia complications

## 2018-02-13 NOTE — Anesthesia Post-op Follow-up Note (Signed)
Anesthesia QCDR form completed.        

## 2018-02-13 NOTE — Anesthesia Procedure Notes (Signed)
Procedure Name: LMA Insertion Date/Time: 02/13/2018 4:53 PM Performed by: Aline Brochure, CRNA Pre-anesthesia Checklist: Patient identified, Emergency Drugs available, Suction available and Patient being monitored Patient Re-evaluated:Patient Re-evaluated prior to induction Oxygen Delivery Method: Circle system utilized Preoxygenation: Pre-oxygenation with 100% oxygen Induction Type: IV induction Ventilation: Mask ventilation without difficulty LMA: LMA inserted LMA Size: 4.0 Number of attempts: 1 Placement Confirmation: positive ETCO2 and breath sounds checked- equal and bilateral Tube secured with: Tape Dental Injury: Teeth and Oropharynx as per pre-operative assessment

## 2018-02-13 NOTE — Anesthesia Preprocedure Evaluation (Signed)
Anesthesia Evaluation  Patient identified by MRN, date of birth, ID band Patient awake    Reviewed: Allergy & Precautions, H&P , NPO status , Patient's Chart, lab work & pertinent test results  History of Anesthesia Complications Negative for: history of anesthetic complications  Airway Mallampati: II  TM Distance: >3 FB Neck ROM: full    Dental  (+) Chipped   Pulmonary neg pulmonary ROS, neg shortness of breath,           Cardiovascular Exercise Tolerance: Good (-) angina(-) Past MI and (-) DOE negative cardio ROS       Neuro/Psych  Headaches, negative psych ROS   GI/Hepatic negative GI ROS, Neg liver ROS,   Endo/Other  Hypothyroidism   Renal/GU      Musculoskeletal   Abdominal   Peds  Hematology negative hematology ROS (+)   Anesthesia Other Findings Past Medical History: No date: Hypothyroidism No date: Thyroid enlarged     Comment:  pt reports "being watched"  Past Surgical History: 12/26/2015: COLONOSCOPY WITH PROPOFOL; N/A     Comment:  Procedure: COLONOSCOPY WITH PROPOFOL;  Surgeon: Lucilla Lame, MD;  Location: [place];  Service:               Endoscopy;  Laterality: N/A; 05/27/2015: EXCISION MASS NECK; Right     Comment:  Procedure: EXCISION MASS NECK RIGHT LIPOMA;  Surgeon:               Clyde Canterbury, MD;  Location: [place];                Service: ENT;  Laterality: Right; No date: TONSILLECTOMY No date: TUBAL LIGATION  BMI    Body Mass Index:  32.82 kg/m      Reproductive/Obstetrics negative OB ROS                             Anesthesia Physical Anesthesia Plan  ASA: III  Anesthesia Plan: General LMA   Post-op Pain Management:    Induction: Intravenous  PONV Risk Score and Plan: Ondansetron, Dexamethasone, Midazolam and Treatment may vary due to age or medical condition  Airway Management Planned: LMA  Additional  Equipment:   Intra-op Plan:   Post-operative Plan: Extubation in OR  Informed Consent: I have reviewed the patients History and Physical, chart, labs and discussed the procedure including the risks, benefits and alternatives for the proposed anesthesia with the patient or authorized representative who has indicated his/her understanding and acceptance.   Dental Advisory Given  Plan Discussed with: Anesthesiologist, CRNA and Surgeon  Anesthesia Plan Comments: (Patient consented for risks of anesthesia including but not limited to:  - adverse reactions to medications - damage to teeth, lips or other oral mucosa - sore throat or hoarseness - Damage to heart, brain, lungs or loss of life  Patient voiced understanding.)        Anesthesia Quick Evaluation

## 2018-02-13 NOTE — Interval H&P Note (Signed)
History and Physical Interval Note:  02/13/2018 4:36 PM  Ashley [name]  has presented today for surgery, with the diagnosis of ABNORMAL PERIMENOPAUSAL BLEEDING, UTERINE ENLARGEMENT, INTRAMURAL AND SUBMUCOUS LEIOMYOMA OF UTERUS  The various methods of treatment have been discussed with the patient and family. After consideration of risks, benefits and other options for treatment, the patient has consented to  Procedure(s): [place] (N/A) as a surgical intervention.  The patient's history has been reviewed, patient examined, no change in status, stable for surgery.  I have reviewed the patient's chart and labs.  Questions were answered to the patient's satisfaction.     Rubie Maid, MD

## 2018-02-13 NOTE — Discharge Instructions (Signed)
AMBULATORY SURGERY  DISCHARGE INSTRUCTIONS   1) The drugs that you were given will stay in your system until tomorrow so for the next 24 hours you should not:  A) Drive an automobile B) Make any legal decisions C) Drink any alcoholic beverage   2) You may resume regular meals tomorrow.  Today it is better to start with liquids and gradually work up to solid foods.  You may eat anything you prefer, but it is better to start with liquids, then soup and crackers, and gradually work up to solid foods.   3) Please notify your doctor immediately if you have any unusual bleeding, trouble breathing, redness and pain at the surgery site, drainage, fever, or pain not relieved by medication.    4) Additional Instructions:        Please contact your physician with any problems or Same Day Surgery at [phone], Monday through Friday 6 am to 4 pm, or [place] at [facility] number at [phone].General Gynecological Post-Operative Instructions You may expect to feel dizzy, weak, and drowsy for as long as 24 hours after receiving the medicine that made you sleep (anesthetic).  Do not drive a car, ride a bicycle, participate in physical activities, or take public transportation until you are done taking narcotic pain medicines or as directed by your doctor.  Do not drink alcohol or take tranquilizers.  Do not take medicine that has not been prescribed by your doctor.  Do not sign important papers or make important decisions while on narcotic pain medicines.  Have a responsible person with you.  CARE OF SURGICAL SITE: Keep incision clean and dry. Take showers instead of baths until your doctor gives you permission to take baths.  Avoid heavy lifting (more than 10 pounds/4.5 kilograms), pushing, or pulling.  Avoid activities that may risk injury to your surgical site.  No sexual intercourse or placement of anything in the vagina for 3 weeks or as instructed by your doctor. If you  have tubes coming from the wound site, check with your doctor regarding appropriate care of the tubes. Only take prescription or over-the-counter medicines  for pain, discomfort, or fever as directed by your doctor. Do not take aspirin. It can make you bleed. Take medicines (antibiotics) that kill germs if they are prescribed for you.  Call the office or go to the Emergency Room if:  You feel sick to your stomach (nauseous).  You start to throw up (vomit).  You have trouble eating or drinking.  You have an oral temperature above 101.  You have constipation that is not helped by adjusting diet or increasing fluid intake. Pain medicines are a common cause of constipation.  You have any other concerns. SEEK IMMEDIATE MEDICAL CARE IF:  You have persistent dizziness.  You have difficulty breathing or a congested sounding (croupy) cough.  You have an oral temperature above 102.5, not controlled by medicine.  There is increasing pain or tenderness near or in the surgical site.

## 2018-02-13 NOTE — Op Note (Signed)
Procedure(s): DILATATION & CURETTAGE/HYSTEROSCOPY WITH GENESYSN HTA THERMAL ABLATION Procedure Note  Ashley [name] female 52 y.o. 02/13/2018  Indications: The patient is a 52 y.o. G24P1001 female with perimenopausal abnormal uterine bleeding, fibroid uterus   Pre-operative Diagnosis: perimenopausal abnormal uterine bleeding, fibroid uterus   Post-operative Diagnosis: Same  Surgeon: Rubie Maid, MD  Assistants:  None  Anesthesia: General endotracheal anesthesia  Procedure Details: The patient was seen in the Holding Room. The risks, benefits, complications, treatment options, and expected outcomes were discussed with the patient.  The patient concurred with the proposed plan, giving informed consent.  The site of surgery properly noted/marked. The patient was taken to the Operating Room, identified as [name] and the procedure verified as Procedure(s) (LRB): DILATATION & CURETTAGE/HYSTEROSCOPY WITH GENESYS HTA THERMAL ABLATION (N/A). A Time Out was held and the above information confirmed.  She was then placed under general anesthesia without difficulty. She was placed in the dorsal lithotomy position, and was prepped and draped in a sterile manner.  A straight catheterization was performed. A sterile speculum was inserted into the vagina and the cervix was grasped at the anterior lip using a single-toothed tenaculum.  The uterus was sounded to 12 cm. Cervical dilation was performed. A 6 mm Myosure hysteroscope was introduced into the uterus under direct visualization. The cavity was allowed to fill, and then the entire cavity was explored with the findings described above. The hysteroscope was removed, and a sharp curette was then passed into the uterus and endometrial sampling was collected for pathology.   Next the [place] system was primed per instructions. The ablation instrument was then placed into the endometrial canal and activated.  Total ablation time was 10  minutes.  On final survey, with adequate charring of the endometrium noted.  The hysteroscope was removed from the patient's uterine cavity. The tenaculum was removed and excellent hemostasis was noted. The speculum was removed from the vagina.   All instrument and sponge counts were correct at the end of the procedure x 2.  The patient tolerated the procedure well.  She was awakened and taken to the PACU in stable condition.   Estimated Blood Loss:  5 ml.    Estimated Fluid Loss: 50 ml  Drains: straight catheterization with 100 ml at start of procedure.          Total IV Fluids:  900 ml  Specimens:  Endometrial curettings         Implants: None         Complications:  None; patient tolerated the procedure well.         Disposition: PACU - hemodynamically stable.         Condition: stable   Rubie Maid, MD [facility]

## 2018-02-14 ENCOUNTER — Encounter: Payer: Self-pay | Admitting: Obstetrics and Gynecology

## 2018-02-15 LAB — SURGICAL PATHOLOGY

## 2018-02-22 ENCOUNTER — Encounter: Payer: Self-pay | Admitting: Obstetrics and Gynecology

## 2018-02-22 ENCOUNTER — Ambulatory Visit (INDEPENDENT_AMBULATORY_CARE_PROVIDER_SITE_OTHER): Payer: 59 | Admitting: Obstetrics and Gynecology

## 2018-02-22 VITALS — BP 151/92 | HR 81 | Ht 65.0 in | Wt 196.5 lb

## 2018-02-22 DIAGNOSIS — Z9889 Other specified postprocedural states: Secondary | ICD-10-CM

## 2018-02-22 DIAGNOSIS — I10 Essential (primary) hypertension: Secondary | ICD-10-CM

## 2018-02-22 MED ORDER — LISINOPRIL 5 MG PO TABS: 5.0000 mg | ORAL_TABLET | Freq: Every day | ORAL | 1 refills | Status: DC

## 2018-02-22 NOTE — Patient Instructions (Signed)

## 2018-02-22 NOTE — Progress Notes (Signed)
    OBSTETRICS/GYNECOLOGY POST-OPERATIVE CLINIC VISIT  Subjective:     Ashley [name] is a 52 y.o. female who presents to the clinic 1 weeks status post operative hysteroscopy with polypectomy endometrial ablation for abnormal uterine bleeding. Eating a regular diet without difficulty. Bowel movements are normal. The patient is not having any pain.  The patient is also having some concerns regarding her blood pressure. She states that over the past several visits, her blood pressures have been elevated. Is worried that she may be developing HTN.  Notes that she eats a healthy diet, and exercises fairly regularly.   The following portions of the patient's history were reviewed and updated as appropriate: allergies, current medications, past family history, past medical history, past social history, past surgical history and problem list.  Review of Systems Pertinent items noted in HPI and remainder of comprehensive ROS otherwise negative.    Objective:    BP (!) 151/92   Pulse 81   Ht 5\' 5"  (1.651 m)   Wt 196 lb 8 oz (89.1 kg)   LMP  (LMP Unknown) Comment: PT HAS BEEN HAVING PERIODS FOR 2 MONTHS  BMI 32.70 kg/m   General:  alert and no distress  Abdomen: soft, bowel sounds active, non-tender  Pelvis:  external genitalia normal, rectovaginal septum normal.  Vagina without discharge.  Cervix normal appearing, no lesions and no motion tenderness.      Pathology:   ENDOMETRIUM; CURETTAGE:  - MIXED WEAKLY PROLIFERATIVE AND UNDERDEVELOPED SECRETORY ENDOMETRIUM.  - ENDOMETRIAL POLYP FRAGMENT.  - FRAGMENTS OF SMOOTH MUSCLE.  - NEGATIVE FOR HYPERPLASIA, ATYPIA, AND MALIGNANCY.   Assessment:    Doing well postoperatively.  S/p endometrial ablation  Hypertension  Plan:   1. Continue any current medications. 2. Wound care discussed.  3. Operative findings again reviewed. Pathology report discussed.  4. Activity restrictions: pelvic rest for an additional 1 week 5. 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 6. Elevated blood pressures.  On review of chart, patient has had intermittently elevated blood pressures since 03/2017, however have been more consistently elevated over the past 2 months.  Denies any recent stressors, notes healthy diet and exercise. Discussed that patient is likely developing HTN.  Patient states she does not have a PCP, but does have an Endocrinologist who she sees tomorrow for management of her hypothyroidism. Will initiate patient on Lisinopril.  Advised to occasionally check BPs at home. Will f/u in 1 month.  Follow up: 1 month for blood pressure check.    Rubie Maid, MD [facility]

## 2018-02-22 NOTE — Progress Notes (Signed)
Pt is present today for her post-op appointment. Pt stated that she was doing well, but concerned about her blood pressure.

## 2018-03-16 ENCOUNTER — Other Ambulatory Visit: Payer: Self-pay | Admitting: Obstetrics and Gynecology

## 2018-03-22 ENCOUNTER — Telehealth: Payer: Self-pay | Admitting: Obstetrics and Gynecology

## 2018-03-22 NOTE — Telephone Encounter (Signed)
Patient called stating she started a new job and will not be able to take off for upcoming appointment on 10/1. She stated the blood pressure meds seem to be helping and would like a refill. She would also like a call back to discuss it. Thanks

## 2018-03-28 ENCOUNTER — Encounter: Payer: 59 | Admitting: Obstetrics and Gynecology

## 2018-04-05 NOTE — Telephone Encounter (Signed)
Pt called no answer LM via voicemail to see if she received her refill of medication.

## 2018-04-06 MED ORDER — LISINOPRIL 5 MG PO TABS: 5.0000 mg | ORAL_TABLET | Freq: Every day | ORAL | 3 refills | Status: DC

## 2018-04-06 NOTE — Addendum Note (Signed)
Addended by: Edwyna Shell on: 04/06/2018 03:50 PM   Modules accepted: Orders

## 2018-04-08 ENCOUNTER — Other Ambulatory Visit: Payer: Self-pay | Admitting: Obstetrics and Gynecology

## 2018-04-17 ENCOUNTER — Other Ambulatory Visit: Payer: Self-pay | Admitting: Obstetrics and Gynecology

## 2018-06-02 ENCOUNTER — Ambulatory Visit (INDEPENDENT_AMBULATORY_CARE_PROVIDER_SITE_OTHER): Payer: BLUE CROSS/BLUE SHIELD | Admitting: Obstetrics and Gynecology

## 2018-06-02 ENCOUNTER — Encounter: Payer: Self-pay | Admitting: Obstetrics and Gynecology

## 2018-06-02 VITALS — BP 149/100 | HR 86 | Ht 65.0 in | Wt 194.8 lb

## 2018-06-02 DIAGNOSIS — I10 Essential (primary) hypertension: Secondary | ICD-10-CM

## 2018-06-02 DIAGNOSIS — R399 Unspecified symptoms and signs involving the genitourinary system: Secondary | ICD-10-CM | POA: Diagnosis not present

## 2018-06-02 LAB — POCT URINALYSIS DIPSTICK
Bilirubin, UA: NEGATIVE
Glucose, UA: NEGATIVE
Ketones, UA: NEGATIVE
NITRITE UA: NEGATIVE
PH UA: 6 (ref 5.0–8.0)
PROTEIN UA: POSITIVE — AB
Spec Grav, UA: 1.025 (ref 1.010–1.025)
UROBILINOGEN UA: 0.2 U/dL

## 2018-06-02 MED ORDER — CIPROFLOXACIN HCL 500 MG PO TABS: 500.0000 mg | ORAL_TABLET | Freq: Two times a day (BID) | ORAL | 0 refills | Status: DC

## 2018-06-02 NOTE — Patient Instructions (Signed)

## 2018-06-02 NOTE — Progress Notes (Signed)
Pt is present today due to having UTI symptoms. Pt stated that she noticed the symptoms of back pains, side pains, and pain with urination along odor of  urine x 5 days.

## 2018-06-02 NOTE — Progress Notes (Signed)
    GYNECOLOGY PROGRESS NOTE  Subjective:    Patient ID: Ashley [name], female    DOB: 1966/03/01, 52 y.o.   MRN: [id number]  HPI  Patient is a 52 y.o. G80P1001 female who presents for burning with urination and bilateral flank pain. She has had symptoms for 1 week. Patient denies fever, sorethroat and vaginal discharge. Patient does not have a history of recurrent UTI. Patient does not have a history of pyelonephritis.  She notes she is increasing water intake and drinking cranberry juice.   The following portions of the patient's history were reviewed and updated as appropriate: allergies, current medications, past family history, past medical history, past social history, past surgical history and problem list.  Review of Systems A comprehensive review of systems was negative except for: Respiratory: positive for cough and congestion   Objective:   Blood pressure (!) 149/100, pulse 86, height 5\' 5"  (1.651 m), weight 194 lb 12.8 oz (88.4 kg). Body mass index is 32.42 kg/m.   General appearance: alert and no distress Abdomen: normal findings: bowel sounds normal, no masses palpable and soft and abnormal findings:  mild tenderness in suprapubic region  Back: symmetric, no curvature. ROM normal. No CVA tenderness. Pelvic: deferred   Labs:  Results for orders placed or performed in visit on 06/02/18  POCT urinalysis dipstick  Result Value Ref Range   Color, UA yellow    Clarity, UA clear    Glucose, UA Negative Negative   Bilirubin, UA neg    Ketones, UA neg    Spec Grav, UA 1.025 1.010 - 1.025   Blood, UA trace non hem    pH, UA 6.0 5.0 - 8.0   Protein, UA Positive (A) Negative   Urobilinogen, UA 0.2 0.2 or 1.0 E.U./dL   Nitrite, UA neg    Leukocytes, UA Moderate (2+) (A) Negative   Appearance yellow    Odor      Assessment:   UTI symptoms Hypertension  Plan:   - UTI symptoms.  UA equivocal. Will go ahead and treat symptoms. Prescription given for Cipro (can  also help congestion symptoms as well). Culture ordered, will contact patient if negative.  - Patient notes that her BP is elevated because she was upset about being stuck in traffic and fearing she was going to miss her appt. Takes her BP meds consistently. Notes she checked her BP yesterday and it was fine.     Rubie Maid, MD [facility]

## 2018-06-04 LAB — URINE CULTURE

## 2018-06-07 ENCOUNTER — Telehealth: Payer: Self-pay

## 2018-06-07 NOTE — Telephone Encounter (Signed)
Pt called and stated that seh was still having UTI symptoms right after she finished her medication. Pt stated that was looking for her test results. Pt was advised that [facility] needed to review the labs before they could be released. Pt stated that she understood and is wanting on a return call from the office as soon as the results are reviewed.

## 2018-06-15 ENCOUNTER — Telehealth: Payer: Self-pay | Admitting: Obstetrics and Gynecology

## 2018-06-15 NOTE — Telephone Encounter (Signed)
The patient states she thinks she has a bacterial infection because she has a discharge.  She knows she does not have a UTI, but wants to know if she can get something for the infection?  Please advise, thanks.

## 2018-06-16 ENCOUNTER — Other Ambulatory Visit: Payer: Self-pay

## 2018-06-16 ENCOUNTER — Telehealth: Payer: Self-pay | Admitting: Obstetrics and Gynecology

## 2018-06-16 MED ORDER — METRONIDAZOLE 500 MG PO TABS: 500.0000 mg | ORAL_TABLET | Freq: Two times a day (BID) | ORAL | 0 refills | Status: DC

## 2018-06-16 MED ORDER — FLUCONAZOLE 150 MG PO TABS: 150.0000 mg | ORAL_TABLET | Freq: Once | ORAL | 0 refills | Status: AC

## 2018-06-16 NOTE — Telephone Encounter (Signed)
The patient is asking for a call back, please advise, thanks.

## 2018-06-16 NOTE — Telephone Encounter (Signed)
Pt returned call and stated that she had spoke with a nurse at her job and that nurse stated that it sounds like she is has a bacterial infection. Pt was asked what type of symptoms she was having and she kept saying that she has a bacterial infection. After 5 minutes of explaining to pt that I needed to know the information to send to [facility]. Pt finally stated that she was having discharge without odor x 1 week, pain on the right side and no urge to urniate. Pt was informed that if she did have a bacterial infection that she would have to come in to see [facility] due to [facility] would need to test her to see what type of infection that she had to properly treat her. Pt stated that she didn't want to come in and have to get off from work early she just wanted [facility] to call her in something. Pt was informed that [facility] would be aware of her condition. Pt stated that she would be expecting a call back today. Pt was informed that [facility] was not in the office today but a message would be sent to her.

## 2018-06-16 NOTE — Telephone Encounter (Signed)
Please see another phone encounter.  

## 2018-06-16 NOTE — Telephone Encounter (Signed)
Pt is informed that [facility] prescribed 2 medication to help treat her infection, but also informed pt that if it do not treat her issues she will need to be seen by [facility]. Pt stated that she understood.

## 2018-06-16 NOTE — Telephone Encounter (Signed)
LM via voicemail: calling to see what type of symptoms pt is having so I could relay the information to [facility].

## 2018-08-30 ENCOUNTER — Telehealth: Payer: Self-pay | Admitting: Obstetrics and Gynecology

## 2018-08-30 NOTE — Telephone Encounter (Signed)
The patient is asking to get a message to her provider/nurse for a call back.  She is asking if it is normal to have a cycle after her procedure.  Please advise, thanks.

## 2018-08-31 NOTE — Telephone Encounter (Signed)
Pt called no answer LM via voicemail to that it was normal for her to have a cycle after having a D&C pt was advised that if the bleeding increased with heavy bleeding, clots and prolonged lasting more than 2 weeks to please call the office to make an appointment to be seen.

## 2018-10-13 ENCOUNTER — Other Ambulatory Visit: Payer: Self-pay

## 2018-10-13 ENCOUNTER — Telehealth: Payer: Self-pay | Admitting: Obstetrics and Gynecology

## 2018-10-13 MED ORDER — METRONIDAZOLE 500 MG PO TABS: 500.0000 mg | ORAL_TABLET | Freq: Two times a day (BID) | ORAL | 0 refills | Status: DC

## 2018-10-13 MED ORDER — FLUCONAZOLE 150 MG PO TABS: 150.0000 mg | ORAL_TABLET | Freq: Once | ORAL | 0 refills | Status: DC

## 2018-10-13 NOTE — Telephone Encounter (Signed)
The patient called and stated that she needs a refill of bacterial infection medication sent to her pharmacy. [facility] on [address]. Please advise.

## 2018-10-13 NOTE — Telephone Encounter (Signed)
Pt called to see if see what type of medication she needed to have filled. Pt stated that she needed a refill of flagyl due to having a bacterial infection.

## 2018-11-02 ENCOUNTER — Other Ambulatory Visit: Payer: Self-pay | Admitting: Obstetrics and Gynecology

## 2018-11-02 DIAGNOSIS — Z1231 Encounter for screening mammogram for malignant neoplasm of breast: Secondary | ICD-10-CM

## 2018-11-03 ENCOUNTER — Other Ambulatory Visit: Payer: Self-pay | Admitting: Obstetrics and Gynecology

## 2018-11-06 DIAGNOSIS — M7671 Peroneal tendinitis, right leg: Secondary | ICD-10-CM | POA: Diagnosis not present

## 2018-11-06 DIAGNOSIS — M722 Plantar fascial fibromatosis: Secondary | ICD-10-CM | POA: Diagnosis not present

## 2019-01-10 ENCOUNTER — Encounter: Payer: 59 | Admitting: Obstetrics and Gynecology

## 2019-01-18 ENCOUNTER — Telehealth: Payer: Self-pay | Admitting: Obstetrics and Gynecology

## 2019-01-18 NOTE — Telephone Encounter (Signed)
Patient called stating she had an ablation in July of 2019 and she has been experiencing period like bleeding for the last 3 months in a row. Is this normal? Please Advise

## 2019-01-19 NOTE — Telephone Encounter (Signed)
Please advise. Thanks Jarnell Cordaro 

## 2019-01-19 NOTE — Telephone Encounter (Signed)
Spoke with pt and informed her that bleeding after an ablation is normal. Pt stated that she is having a cycle every month but after her cycle she noticed a week of light bleeding and spotting.

## 2019-01-19 NOTE — Telephone Encounter (Signed)
Please inform patient that sometimes this can occur.  Although the ablation gets rid of cycles in ~ 75 % of women, few will still have a cycle or some mild abnormal bleeding.  She can be prescribed Aygestin for her bleeding, 5 mg daily x 3 months.

## 2019-01-24 MED ORDER — NORETHINDRONE ACETATE 5 MG PO TABS: 5.0000 mg | ORAL_TABLET | Freq: Every day | ORAL | 3 refills | Status: DC

## 2019-01-24 NOTE — Addendum Note (Signed)
Addended by: Edwyna Shell on: 01/24/2019 03:06 PM   Modules accepted: Orders

## 2019-02-10 ENCOUNTER — Emergency Department
Admission: EM | Admit: 2019-02-10 | Discharge: 2019-02-10 | Disposition: A | Discharge status: Home / Self Care | Payer: PRIVATE HEALTH INSURANCE | Attending: Emergency Medicine | Admitting: Emergency Medicine

## 2019-02-10 ENCOUNTER — Other Ambulatory Visit: Payer: Self-pay

## 2019-02-10 ENCOUNTER — Emergency Department: Payer: PRIVATE HEALTH INSURANCE

## 2019-02-10 DIAGNOSIS — E039 Hypothyroidism, unspecified: Secondary | ICD-10-CM | POA: Insufficient documentation

## 2019-02-10 DIAGNOSIS — Z79899 Other long term (current) drug therapy: Secondary | ICD-10-CM | POA: Insufficient documentation

## 2019-02-10 DIAGNOSIS — U071 COVID-19: Secondary | ICD-10-CM | POA: Insufficient documentation

## 2019-02-10 LAB — SARS CORONAVIRUS 2 BY RT PCR (HOSPITAL ORDER, PERFORMED IN ~~LOC~~ HOSPITAL LAB): SARS Coronavirus 2: POSITIVE — AB

## 2019-02-10 MED ORDER — ACETAMINOPHEN 500 MG PO TABS
1000.0000 mg | ORAL_TABLET | Freq: Once | ORAL | Status: AC
  Administered 2019-02-10: 1000 mg via ORAL

## 2019-02-10 MED ORDER — ACETAMINOPHEN 500 MG PO TABS: 1000.0000 mg | ORAL_TABLET | Freq: Once | ORAL | Status: DC

## 2019-02-10 MED ORDER — PSEUDOEPH-BROMPHEN-DM 30-2-10 MG/5ML PO SYRP: 5.0000 mL | ORAL_SOLUTION | Freq: Four times a day (QID) | ORAL | 0 refills | Status: DC | PRN

## 2019-02-10 MED ORDER — ACETAMINOPHEN 500 MG PO TABS
ORAL_TABLET | ORAL | Status: AC
  Filled 2019-02-10: qty 2

## 2019-02-10 NOTE — Discharge Instructions (Signed)
Follow-up with your primary care provider if any continued problems.  Take Tylenol or ibuprofen as needed for fever.  Increase fluids and stay hydrated.  Self quarantine for 14 days.  Read information about COVID-19 and how to prevent spread.  Out of work note was written.  Return to the emergency department if any severe worsening of your symptoms such as difficulty breathing or shortness of breath.

## 2019-02-10 NOTE — ED Notes (Signed)
FIRST NURSE NOTE:  Pt c/o "flu-like" sxs, pt states she feels "sick" No distress noted on arrival. Pt wearing mask on arrival.

## 2019-02-10 NOTE — ED Notes (Signed)
Pt given discharge instructions from the doorway since positive covid. Pt states understanding.

## 2019-02-10 NOTE — ED Triage Notes (Signed)
Pt states cough, body aches, eye pressure x 3 days. A&O, ambulatory. Pt states "I think I caught a bug at work." No distress noted. Pt denies fever yesterday. Didn't know she had one at this time. No tylenol or motrin taken PTA.

## 2019-02-10 NOTE — ED Provider Notes (Signed)
[facility] Emergency Department Provider Note  ____________________________________________   First MD Initiated Contact with Patient 02/10/19 1454     (approximate)  I have reviewed the triage vital signs and the nursing notes.   HISTORY  Chief Complaint Generalized Body Aches   HPI Ashley [name] is a 53 y.o. female presents to the ED with complaint of cough, body aches, and feeling as if she has flu.  Patient was unaware of any fever however in triage she was febrile with a temp of 101.8.  Patient denies any urinary or GI symptoms.  She works at [employer] and is unaware of any exposure to [place].  Patient is a non-smoker and at this time denies any difficulty breathing.       Past Medical History:  Diagnosis Date  . Hypothyroidism   . Thyroid enlarged    pt reports "being watched"    Patient Active Problem List   Diagnosis Date Noted  . S/P endometrial ablation 02/13/2018  . Premenstrual symptom 02/06/2016  . Menstrual migraine without status migrainosus, not intractable 02/06/2016  . Special screening for malignant neoplasms, colon     Past Surgical History:  Procedure Laterality Date  . COLONOSCOPY WITH PROPOFOL N/A 12/26/2015   Procedure: COLONOSCOPY WITH PROPOFOL;  Surgeon: Lucilla Lame, MD;  Location: [place];  Service: Endoscopy;  Laterality: N/A;  . DILATATION & CURETTAGE/HYSTEROSCOPY WITH MYOSURE N/A 02/13/2018   Procedure: DILATATION & CURETTAGE/HYSTEROSCOPY WITH GENESYSN HTA THERMAL ABLATION;  Surgeon: Rubie Maid, MD;  Location: [facility];  Service: Gynecology;  Laterality: N/A;  . EXCISION MASS NECK Right 05/27/2015   Procedure: EXCISION MASS NECK RIGHT LIPOMA;  Surgeon: Clyde Canterbury, MD;  Location: [place];  Service: ENT;  Laterality: Right;  . TONSILLECTOMY    . TUBAL LIGATION      Prior to Admission medications   Medication Sig Start Date End Date Taking? Authorizing Provider  acetaminophen (TYLENOL)  500 MG tablet Take 500-1,000 mg by mouth daily as needed for moderate pain or headache.    [provider]  Biotin 5000 MCG CAPS Take 5,000 mcg by mouth daily.    [provider]  brompheniramine-pseudoephedrine-DM 30-2-10 MG/5ML syrup Take 5 mLs by mouth 4 (four) times daily as needed. 02/10/19   Johnn Hai, PA-C  ciprofloxacin (CIPRO) 500 MG tablet Take 1 tablet (500 mg total) by mouth 2 (two) times daily. 06/02/18   Rubie Maid, MD  ibuprofen (ADVIL,MOTRIN) 800 MG tablet TAKE 1 TABLET BY MOUTH EVERY 8 HOURS AS NEEDED 04/17/18   Rubie Maid, MD  IRON PO Take 1 tablet by mouth daily.    [provider]  levothyroxine (SYNTHROID, LEVOTHROID) 50 MCG tablet Take 50 mcg by mouth daily before breakfast.    [provider]  lisinopril (ZESTRIL) 5 MG tablet TAKE 1 TABLET BY MOUTH EVERY DAY 11/03/18   Rubie Maid, MD  loratadine (CLARITIN) 10 MG tablet Take 10 mg by mouth daily.    [provider]  Melatonin 5 MG TABS Take 5 mg by mouth daily as needed (sleep).    [provider]  metroNIDAZOLE (FLAGYL) 500 MG tablet Take 1 tablet (500 mg total) by mouth 2 (two) times daily. 10/13/18   Rubie Maid, MD  mometasone (NASONEX) 50 MCG/ACT nasal spray Place 2 sprays into the nose daily as needed for allergies. 11/15/17   [provider]  Multiple Vitamin (MULTIVITAMIN) tablet Take 1 tablet by mouth daily.    [provider]  norethindrone (AYGESTIN) 5 MG tablet Take 1 tablet (5 mg total) by mouth daily. Take once tablet daily for 3 months. 01/24/19   Rubie Maid, MD    Allergies Patient has no known allergies.  Family History  Problem Relation Age of Onset  . Hypertension Mother   . Hypertension Father   . Diabetes Father     Social History Social History   Tobacco Use  . Smoking status: Never Smoker  . Smokeless tobacco: Never Used  Substance Use Topics  . Alcohol use: Yes    Comment: OCC  . Drug use: No     Review of Systems Constitutional: Subjective fever/chills Eyes: No visual changes. ENT: No sore throat.  Positive "eye pressure". Cardiovascular: Denies chest pain. Respiratory: Denies shortness of breath.  Positive cough. Gastrointestinal: No abdominal pain.  No nausea, no vomiting.  No diarrhea.  No constipation. Genitourinary: Negative for dysuria. Musculoskeletal: Positive for muscle aches. Skin: Negative for rash. Neurological: Negative for headaches, focal weakness or numbness. ____________________________________________   PHYSICAL EXAM:  VITAL SIGNS: ED Triage Vitals  Enc Vitals Group     BP 02/10/19 1424 133/90     Pulse Rate 02/10/19 1424 96     Resp 02/10/19 1424 16     Temp 02/10/19 1424 (!) 101.8 F (38.8 C)     Temp Source 02/10/19 1424 Oral     SpO2 02/10/19 1424 98 %     Weight 02/10/19 1425 180 lb (81.6 kg)     Height 02/10/19 1425 5\' 5"  (1.651 m)     Head Circumference --      Peak Flow --      Pain Score 02/10/19 1424 6     Pain Loc --      Pain Edu? --      Excl. in [place]? --    Constitutional: Alert and oriented. Well appearing and in no acute distress. Eyes: Conjunctivae are normal.  Head: Atraumatic. Nose: No congestion/rhinnorhea. Mouth/Throat: Mucous membranes are moist.  Oropharynx non-erythematous. Neck: No stridor.   Cardiovascular: Normal rate, regular rhythm. Grossly normal heart sounds.  Good peripheral circulation. Respiratory: Normal respiratory effort.  No retractions. Lungs CTAB. Musculoskeletal: Moves upper and lower extremities with any difficulty normal gait was noted. Neurologic:  Normal speech and language. No gross focal neurologic deficits are appreciated. No gait instability. Skin:  Skin is warm, dry and intact. No rash noted. Psychiatric: Mood and affect are normal. Speech and behavior are normal.  ____________________________________________   LABS (all labs ordered are listed, but only abnormal results are displayed)   Labs Reviewed  SARS CORONAVIRUS 2 (HOSPITAL ORDER, [facility]) - Abnormal; Notable for the following components:      Result Value   SARS Coronavirus 2 POSITIVE (*)    All other components within normal limits    RADIOLOGY  Official radiology report(s): Dg Chest Portable 1 View  Result Date: 02/10/2019 CLINICAL DATA:  Cough and fever for 2-3 days. EXAM: PORTABLE CHEST 1 VIEW COMPARISON:  Chest x-rays dated 05/02/2017 and 03/20/2015. FINDINGS: Borderline cardiomegaly. Mild perihilar interstitial prominence, possibly acute bronchitic change. No confluent opacity to suggest a consolidating pneumonia no pleural effusion or pneumothorax seen. Osseous structures about the chest are unremarkable. IMPRESSION: 1. Mild interstitial prominence, possibly acute bronchitic change. No evidence of consolidating pneumonia. 2. Borderline cardiomegaly. Electronically Signed   By: Franki Cabot M.D.   On: 02/10/2019 16:07    ____________________________________________   PROCEDURES  Procedure(s) performed (including Critical Care):  Procedures   ____________________________________________   INITIAL IMPRESSION / ASSESSMENT AND PLAN / ED COURSE  As part of my medical decision making, I reviewed the following data within the electronic MEDICAL RECORD NUMBER Notes from prior ED visits and [place] Controlled Substance Database  53 year old female presents to the ED with 3-day period of cough and body aches.  Patient was febrile with a temperature of 101.8 in triage which she was unaware of.  COVID test was positive.  Chest x-ray was discussed.  Patient is to follow-up with her PCP if any continued problems.  She was given a note to remain out of work for 14 days.  Patient also requested a prescription for cough medication and Bromfed-DM was sent to her pharmacy.  She is aware that she needs to continue taking Tylenol or ibuprofen as needed for fever and drink plenty of fluids to stay  hydrated.  Ashley [name] was evaluated in Emergency Department on 02/10/2019 for the symptoms described in the history of present illness. She was evaluated in the context of the global COVID-19 pandemic, which necessitated consideration that the patient might be at risk for infection with the SARS-CoV-2 virus that causes COVID-19. Institutional protocols and algorithms that pertain to the evaluation of patients at risk for COVID-19 are in a state of rapid change based on information released by regulatory bodies including the CDC and federal and state organizations. These policies and algorithms were followed during the patient's care in the ED.  ____________________________________________   FINAL CLINICAL IMPRESSION(S) / ED DIAGNOSES  Final diagnoses:  COVID-19 virus infection     ED Discharge Orders         Ordered    brompheniramine-pseudoephedrine-DM 30-2-10 MG/5ML syrup  4 times daily PRN     02/10/19 1652           Note:  This document was prepared using Dragon voice recognition software and may include unintentional dictation errors.    Johnn Hai, PA-C 02/10/19 1737    Nance Pear, MD 02/10/19 [phone]

## 2019-02-27 ENCOUNTER — Ambulatory Visit
Admission: RE | Admit: 2019-02-27 | Discharge: 2019-02-27 | Disposition: A | Discharge status: Home / Self Care | Payer: PRIVATE HEALTH INSURANCE | Source: Ambulatory Visit | Attending: Obstetrics and Gynecology | Admitting: Obstetrics and Gynecology

## 2019-02-27 DIAGNOSIS — Z1231 Encounter for screening mammogram for malignant neoplasm of breast: Secondary | ICD-10-CM | POA: Diagnosis present

## 2019-02-27 NOTE — Progress Notes (Signed)
Pt is present for annual exam. Pt stated aygestin 5mg  is not working pt stated that she still having vaginal bleeding everyday and ovary pains that is hard to ease with pain relief medication. Pt needs refill of lisinopril bp was 146/81 today.

## 2019-02-28 ENCOUNTER — Encounter: Payer: Self-pay | Admitting: Obstetrics and Gynecology

## 2019-02-28 ENCOUNTER — Other Ambulatory Visit: Payer: Self-pay

## 2019-02-28 ENCOUNTER — Ambulatory Visit (INDEPENDENT_AMBULATORY_CARE_PROVIDER_SITE_OTHER): Payer: PRIVATE HEALTH INSURANCE | Admitting: Obstetrics and Gynecology

## 2019-02-28 VITALS — BP 146/81 | HR 93 | Ht 65.0 in | Wt 190.1 lb

## 2019-02-28 DIAGNOSIS — Z131 Encounter for screening for diabetes mellitus: Secondary | ICD-10-CM | POA: Diagnosis not present

## 2019-02-28 DIAGNOSIS — N951 Menopausal and female climacteric states: Secondary | ICD-10-CM

## 2019-02-28 DIAGNOSIS — E66811 Obesity, class 1: Secondary | ICD-10-CM

## 2019-02-28 DIAGNOSIS — E669 Obesity, unspecified: Secondary | ICD-10-CM

## 2019-02-28 DIAGNOSIS — Z13220 Encounter for screening for lipoid disorders: Secondary | ICD-10-CM

## 2019-02-28 DIAGNOSIS — Z01419 Encounter for gynecological examination (general) (routine) without abnormal findings: Secondary | ICD-10-CM | POA: Diagnosis not present

## 2019-02-28 DIAGNOSIS — Z8616 Personal history of COVID-19: Secondary | ICD-10-CM | POA: Insufficient documentation

## 2019-02-28 DIAGNOSIS — Z8619 Personal history of other infectious and parasitic diseases: Secondary | ICD-10-CM

## 2019-02-28 DIAGNOSIS — I10 Essential (primary) hypertension: Secondary | ICD-10-CM

## 2019-02-28 DIAGNOSIS — E039 Hypothyroidism, unspecified: Secondary | ICD-10-CM

## 2019-02-28 MED ORDER — CONJ ESTROG-MEDROXYPROGEST ACE 0.45-1.5 MG PO TABS: 1.0000 | ORAL_TABLET | Freq: Every day | ORAL | 3 refills | Status: DC

## 2019-02-28 MED ORDER — LISINOPRIL 5 MG PO TABS: 5.0000 mg | ORAL_TABLET | Freq: Every day | ORAL | 3 refills | Status: DC

## 2019-02-28 NOTE — Patient Instructions (Addendum)
Preventive Care 40-52 Years Old, Female Preventive care refers to visits with your health care provider and lifestyle choices that can promote health and wellness. This includes:  A yearly physical exam. This may also be called an annual well check.  Regular dental visits and eye exams.  Immunizations.  Screening for certain conditions.  Healthy lifestyle choices, such as eating a healthy diet, getting regular exercise, not using drugs or products that contain nicotine and tobacco, and limiting alcohol use. What can I expect for my preventive care visit? Physical exam Your health care provider will check your:  Height and weight. This may be used to calculate body mass index (BMI), which tells if you are at a healthy weight.  Heart rate and blood pressure.  Skin for abnormal spots. Counseling Your health care provider may ask you questions about your:  Alcohol, tobacco, and drug use.  Emotional well-being.  Home and relationship well-being.  Sexual activity.  Eating habits.  Work and work environment.  Method of birth control.  Menstrual cycle.  Pregnancy history. What immunizations do I need?  Influenza (flu) vaccine  This is recommended every year. Tetanus, diphtheria, and pertussis (Tdap) vaccine  You may need a Td booster every 10 years. Varicella (chickenpox) vaccine  You may need this if you have not been vaccinated. Zoster (shingles) vaccine  You may need this after age 60. Measles, mumps, and rubella (MMR) vaccine  You may need at least one dose of MMR if you were born in 1957 or later. You may also need a second dose. Pneumococcal conjugate (PCV13) vaccine  You may need this if you have certain conditions and were not previously vaccinated. Pneumococcal polysaccharide (PPSV23) vaccine  You may need one or two doses if you smoke cigarettes or if you have certain conditions. Meningococcal conjugate (MenACWY) vaccine  You may need this if you  have certain conditions. Hepatitis A vaccine  You may need this if you have certain conditions or if you travel or work in places where you may be exposed to hepatitis A. Hepatitis B vaccine  You may need this if you have certain conditions or if you travel or work in places where you may be exposed to hepatitis B. Haemophilus influenzae type b (Hib) vaccine  You may need this if you have certain conditions. Human papillomavirus (HPV) vaccine  If recommended by your health care provider, you may need three doses over 6 months. You may receive vaccines as individual doses or as more than one vaccine together in one shot (combination vaccines). Talk with your health care provider about the risks and benefits of combination vaccines. What tests do I need? Blood tests  Lipid and cholesterol levels. These may be checked every 5 years, or more frequently if you are over 50 years old.  Hepatitis C test.  Hepatitis B test. Screening  Lung cancer screening. You may have this screening every year starting at age 55 if you have a 30-pack-year history of smoking and currently smoke or have quit within the past 15 years.  Colorectal cancer screening. All adults should have this screening starting at age 50 and continuing until age 75. Your health care provider may recommend screening at age 45 if you are at increased risk. You will have tests every 1-10 years, depending on your results and the type of screening test.  Diabetes screening. This is done by checking your blood sugar (glucose) after you have not eaten for a while (fasting). You may have this   done every 1-3 years.  Mammogram. This may be done every 1-2 years. Talk with your health care provider about when you should start having regular mammograms. This may depend on whether you have a family history of breast cancer.  BRCA-related cancer screening. This may be done if you have a family history of breast, ovarian, tubal, or peritoneal  cancers.  Pelvic exam and Pap test. This may be done every 3 years starting at age 23. Starting at age 86, this may be done every 5 years if you have a Pap test in combination with an HPV test. Other tests  Sexually transmitted disease (STD) testing.  Bone density scan. This is done to screen for osteoporosis. You may have this scan if you are at high risk for osteoporosis. Follow these instructions at home: Eating and drinking  Eat a diet that includes fresh fruits and vegetables, whole grains, lean protein, and low-fat dairy.  Take vitamin and mineral supplements as recommended by your health care provider.  Do not drink alcohol if: ? Your health care provider tells you not to drink. ? You are pregnant, may be pregnant, or are planning to become pregnant.  If you drink alcohol: ? Limit how much you have to 0-1 drink a day. ? Be aware of how much alcohol is in your drink. In the [place] one drink equals one 12 oz bottle of beer (355 mL), one 5 oz glass of wine (148 mL), or one 1 oz glass of hard liquor (44 mL). Lifestyle  Take daily care of your teeth and gums.  Stay active. Exercise for at least 30 minutes on 5 or more days each week.  Do not use any products that contain nicotine or tobacco, such as cigarettes, e-cigarettes, and chewing tobacco. If you need help quitting, ask your health care provider.  If you are sexually active, practice safe sex. Use a condom or other form of birth control (contraception) in order to prevent pregnancy and STIs (sexually transmitted infections).  If told by your health care provider, take low-dose aspirin daily starting at age 27. What's next?  Visit your health care provider once a year for a well check visit.  Ask your health care provider how often you should have your eyes and teeth checked.  Stay up to date on all vaccines. This information is not intended to replace advice given to you by your health care provider. Make sure you  discuss any questions you have with your health care provider. Document Released: 07/11/2015 Document Revised: 02/23/2018 Document Reviewed: 02/23/2018 Elsevier Patient Education  2020 [place] self-awareness is knowing how your breasts look and feel. Doing breast self-awareness is important. It allows you to catch a breast problem early while it is still small and can be treated. All women should do breast self-awareness, including women who have had breast implants. Tell your doctor if you notice a change in your breasts. What you need:  A mirror.  A well-lit room. How to do a breast self-exam A breast self-exam is one way to learn what is normal for your breasts and to check for changes. To do a breast self-exam: Look for changes  1. Take off all the clothes above your waist. 2. Stand in front of a mirror in a room with good lighting. 3. Put your hands on your hips. 4. Push your hands down. 5. Look at your breasts and nipples in the mirror to see if one breast or nipple looks  different from the other. Check to see if: ? The shape of one breast is different. ? The size of one breast is different. ? There are wrinkles, dips, and bumps in one breast and not the other. 6. Look at each breast for changes in the skin, such as: ? Redness. ? Scaly areas. 7. Look for changes in your nipples, such as: ? Liquid around the nipples. ? Bleeding. ? Dimpling. ? Redness. ? A change in where the nipples are. Feel for changes  1. Lie on your back on the floor. 2. Feel each breast. To do this, follow these steps: ? Pick a breast to feel. ? Put the arm closest to that breast above your head. ? Use your other arm to feel the nipple area of your breast. Feel the area with the pads of your three middle fingers by making small circles with your fingers. For the first circle, press lightly. For the second circle, press harder. For the third circle, press even harder.  ? Keep making circles with your fingers at the different pressures as you move down your breast. Stop when you feel your ribs. ? Move your fingers a little toward the center of your body. ? Start making circles with your fingers again, this time going up until you reach your collarbone. ? Keep making up-and-down circles until you reach your armpit. Remember to keep using the three pressures. ? Feel the other breast in the same way. 3. Sit or stand in the tub or shower. 4. With soapy water on your skin, feel each breast the same way you did in step 2 when you were lying on the floor. Write down what you find Writing down what you find can help you remember what to tell your doctor. Write down:  What is normal for each breast.  Any changes you find in each breast, including: ? The kind of changes you find. ? Whether you have pain. ? Size and location of any lumps.  When you last had your menstrual period. General tips  Check your breasts every month.  If you are breastfeeding, the best time to check your breasts is after you feed your baby or after you use a breast pump.  If you get menstrual periods, the best time to check your breasts is 5-7 days after your menstrual period is over.  With time, you will become comfortable with the self-exam, and you will begin to know if there are changes in your breasts. Contact a doctor if you:  See a change in the shape or size of your breasts or nipples.  See a change in the skin of your breast or nipples, such as red or scaly skin.  Have fluid coming from your nipples that is not normal.  Find a lump or thick area that was not there before.  Have pain in your breasts.  Have any concerns about your breast health. Summary  Breast self-awareness includes looking for changes in your breasts, as well as feeling for changes within your breasts.  Breast self-awareness should be done in front of a mirror in a well-lit room.  You should  check your breasts every month. If you get menstrual periods, the best time to check your breasts is 5-7 days after your menstrual period is over.  Let your doctor know of any changes you see in your breasts, including changes in size, changes on the skin, pain or tenderness, or fluid from your nipples that is not normal. This  This information is not intended to replace advice given to you by your health care provider. Make sure you discuss any questions you have with your health care provider. Document Released: 12/01/2007 Document Revised: 01/31/2018 Document Reviewed: 01/31/2018 Elsevier Patient Education  2020 Else[employer] Inc[employer]    Perimenopause  Perimenopause is the normal time of life before and after menstrual periods stop completely (menopause). Perimenopause can begin 2-8 years before menopause, and it usually lasts for 1 year after menopause. During perimenopause, the ovaries may or may not produce an egg. What are the causes? This condition is caused by a natural change in hormone levels that happens as you get older. What increases the risk? This condition is more likely to start at an earlier age if you have certain medical conditions or treatments, including:  A tumor of the pituitary gland in the brain.  A disease that affects the ovaries and hormone production.  Radiation treatment for cancer.  Certain cancer treatments, such as chemotherapy or hormone (anti-estrogen) therapy.  Heavy smoking and excessive alcohol use.  Family history of early menopause. What are the signs or symptoms? Perimenopausal changes affect each woman differently. Symptoms of this condition may include:  Hot flashes.  Night sweats.  Irregular menstrual periods.  Decreased sex drive.  Vaginal dryness.  Headaches.  Mood swings.  Depression.  Memory problems or trouble concentrating.  Irritability.  Tiredness.  Weight gain.  Anxiety.  Trouble getting pregnant. How is this  diagnosed? This condition is diagnosed based on your medical history, a physical exam, your age, your menstrual history, and your symptoms. Hormone tests may also be done. How is this treated? In some cases, no treatment is needed. You and your health care provider should make a decision together about whether treatment is necessary. Treatment will be based on your individual condition and preferences. Various treatments are available, such as:  Menopausal hormone therapy (MHT).  Medicines to treat specific symptoms.  Acupuncture.  Vitamin or herbal supplements. Before starting treatment, make sure to let your health care provider know if you have a personal or family history of:  Heart disease.  Breast cancer.  Blood clots.  Diabetes.  Osteoporosis. Follow these instructions at home: Lifestyle  Do not use any products that contain nicotine or tobacco, such as cigarettes and e-cigarettes. If you need help quitting, ask your health care provider.  Eat a balanced diet that includes fresh fruits and vegetables, whole grains, soybeans, eggs, lean meat, and low-fat dairy.  Get at least 30 minutes of physical activity on 5 or more days each week.  Avoid alcoholic and caffeinated beverages, as well as spicy foods. This may help prevent hot flashes.  Get 7-8 hours of sleep each night.  Dress in layers that can be removed to help you manage hot flashes.  Find ways to manage stress, such as deep breathing, meditation, or journaling. General instructions  Keep track of your menstrual periods, including: ? When they occur. ? How heavy they are and how long they last. ? How much time passes between periods.  Keep track of your symptoms, noting when they start, how often you have them, and how long they last.  Take over-the-counter and prescription medicines only as told by your health care provider.  Take vitamin supplements only as told by your health care provider. These may  include calcium, vitamin E, and vitamin D.  Use vaginal lubricants or moisturizers to help with vaginal dryness and improve comfort during sex.  Talk with your   health care provider before starting any herbal supplements.  Keep all follow-up visits as told by your health care provider. This is important. This includes any group therapy or counseling. Contact a health care provider if:  You have heavy vaginal bleeding or pass blood clots.  Your period lasts more than 2 days longer than normal.  Your periods are recurring sooner than 21 days.  You bleed after having sex. Get help right away if:  You have chest pain, trouble breathing, or trouble talking.  You have severe depression.  You have pain when you urinate.  You have severe headaches.  You have vision problems. Summary  Perimenopause is the time when a woman's body begins to move into menopause. This may happen naturally or as a result of other health problems or medical treatments.  Perimenopause can begin 2-8 years before menopause, and it usually lasts for 1 year after menopause.  Perimenopausal symptoms can be managed through medicines, lifestyle changes, and complementary therapies such as acupuncture. This information is not intended to replace advice given to you by your health care provider. Make sure you discuss any questions you have with your health care provider. Document Released: 07/22/2004 Document Revised: 05/27/2017 Document Reviewed: 07/20/2016 Elsevier Patient Education  2020 [employer] Inc[employer]   

## 2019-02-28 NOTE — Progress Notes (Signed)
GYNECOLOGY ANNUAL PHYSICAL EXAM PROGRESS NOTE  Subjective:    Ashley [name] is a 53 y.o. G33P1001 female who presents for an annual exam.The patient is sexually active.The patient wears seatbelts: yes. The patient participates in regular exercise: no. Has the patient ever been transfused or tattooed?: no. The patient reports that there is not domestic violence in her life.    The patient has the following complaints today: 1. Patient noting some mild hot flushes. 2. Patient reports that she is still having some abnormal uterine bleeding despite endometrial ablation performed last year. Notes she had 6 months of no bleeding, followed by intermittent periods of bleeding, and now is to the point of having daily bleeding (light flow, with dark red blood) and also reporting ovary pains that are hard to ease with OTC pain relief.  Tried taking Aygestin in the past which has not helped.  3. Notes that she recently tested positive for COVID ~ 2 weeks ago. Notes after 3 days of symptoms she began to feel better. Recently retested and was negative.  4. Patient needs refill of BP medication.   Gynecologic History  Menarche age: 89 No LMP recorded. Patient is perimenopausal. Menstrual cycles irregular. Contraception: tubal ligation History of STI's: Denies Last Pap: 12/30/2017. Results were: normal.  Denies h/o abnormal pap smears. Last mammogram: 02/26/2018 Results were: normal Last colonoscopy: 11/2015. Results were: normal.    OB History  Gravida Para Term Preterm AB Living  1 1 1  0 0 1  SAB TAB Ectopic Multiple Live Births  0 0 0 0 1    # Outcome Date GA Lbr Len/2nd Weight Sex Delivery Anes PTL Lv  1 Term 1992 [redacted]w[redacted]d  6 lb 7 oz (2.92 kg) F Vag-Spont   LIV    Past Medical History:  Diagnosis Date  . Hypothyroidism   . Thyroid enlarged    pt reports "being watched"    Past Surgical History:  Procedure Laterality Date  . COLONOSCOPY WITH PROPOFOL N/A 12/26/2015   Procedure:  COLONOSCOPY WITH PROPOFOL;  Surgeon: Lucilla Lame, MD;  Location: [facility];  Service: Endoscopy;  Laterality: N/A;  . DILATATION & CURETTAGE/HYSTEROSCOPY WITH MYOSURE N/A 02/13/2018   Procedure: DILATATION & CURETTAGE/HYSTEROSCOPY WITH GENESYSN HTA THERMAL ABLATION;  Surgeon: Rubie Maid, MD;  Location: [facility];  Service: Gynecology;  Laterality: N/A;  . EXCISION MASS NECK Right 05/27/2015   Procedure: EXCISION MASS NECK RIGHT LIPOMA;  Surgeon: Clyde Canterbury, MD;  Location: [facility];  Service: ENT;  Laterality: Right;  . TONSILLECTOMY    . TUBAL LIGATION      Family History  Problem Relation Age of Onset  . Hypertension Mother   . Hypertension Father   . Diabetes Father     Social History   Socioeconomic History  . Marital status: Single    Spouse name: Not on file  . Number of children: Not on file  . Years of education: Not on file  . Highest education level: Not on file  Occupational History  . Not on file  Social Needs  . Financial resource strain: Not on file  . Food insecurity    Worry: Not on file    Inability: Not on file  . Transportation needs    Medical: Not on file    Non-medical: Not on file  Tobacco Use  . Smoking status: Never Smoker  . Smokeless tobacco: Never Used  Substance and Sexual Activity  . Alcohol use: Not Currently  Comment: OCC  . Drug use: No  . Sexual activity: Yes    Birth control/protection: None, Surgical  Lifestyle  . Physical activity    Days per week: Not on file    Minutes per session: Not on file  . Stress: Not on file  Relationships  . Social Herbalist on phone: Not on file    Gets together: Not on file    Attends religious service: Not on file    Active member of club or organization: Not on file    Attends meetings of clubs or organizations: Not on file    Relationship status: Not on file  . Intimate partner violence    Fear of current or ex partner: Not on file    Emotionally abused:  Not on file    Physically abused: Not on file    Forced sexual activity: Not on file  Other Topics Concern  . Not on file  Social History Narrative  . Not on file    Current Outpatient Medications on File Prior to Visit  Medication Sig Dispense Refill  . acetaminophen (TYLENOL) 500 MG tablet Take 500-1,000 mg by mouth daily as needed for moderate pain or headache.    . Biotin 5000 MCG CAPS Take 5,000 mcg by mouth daily.    . IRON PO Take 1 tablet by mouth daily.    Marland Kitchen levothyroxine (SYNTHROID, LEVOTHROID) 50 MCG tablet Take 50 mcg by mouth daily before breakfast.    . norethindrone (AYGESTIN) 5 MG tablet Take 1 tablet (5 mg total) by mouth daily. Take once tablet daily for 3 months. 30 tablet 3  . VITAMIN D PO Take by mouth.    . Melatonin 5 MG TABS Take 5 mg by mouth daily as needed (sleep).    . Multiple Vitamin (MULTIVITAMIN) tablet Take 1 tablet by mouth daily.     No current facility-administered medications on file prior to visit.     No Known Allergies   Review of Systems Constitutional: negative for chills, fatigue, fevers.  Positive for mild hot flushes and sweats Eyes: negative for irritation, redness and visual disturbance Ears, nose, mouth, throat, and face: negative for hearing loss, nasal congestion, snoring and tinnitus Respiratory: negative for asthma, cough, sputum Cardiovascular: negative for chest pain, dyspnea, exertional chest pressure/discomfort, irregular heart beat, palpitations and syncope Gastrointestinal: negative for abdominal pain, change in bowel habits, nausea and vomiting Genitourinary: positive for abnormal menstrual periods s/p ablation. Negative genital lesions, sexual problems and vaginal discharge, dysuria and urinary incontinence Integument/breast: negative for breast lump, breast tenderness and nipple discharge Hematologic/lymphatic: negative for bleeding and easy bruising Musculoskeletal:negative for back pain and muscle weakness  Neurological: negative for dizziness, headaches, vertigo and weakness Endocrine: negative for diabetic symptoms including polydipsia, polyuria and skin dryness Allergic/Immunologic: negative for hay fever and urticaria       Objective:  Blood pressure (!) 146/81, pulse 93, height 5\' 5"  (1.651 m), weight 190 lb 1.6 oz (86.2 kg). Body mass index is 31.63 kg/m.   General Appearance:    Alert, cooperative, no distress, appears stated age, mild obesity  Head:    Normocephalic, without obvious abnormality, atraumatic  Eyes:    PERRL, conjunctiva/corneas clear, EOM's intact, both eyes  Ears:    Normal external ear canals, both ears  Nose:   Nares normal, septum midline, mucosa normal, no drainage or sinus tenderness  Throat:   Lips, mucosa, and tongue normal; teeth and gums normal  Neck:  Supple, symmetrical, trachea midline, no adenopathy; thyroid: no enlargement/tenderness/nodules; no carotid bruit or JVD  Back:     Symmetric, no curvature, ROM normal, no CVA tenderness  Lungs:     Clear to auscultation bilaterally, respirations unlabored  Chest Wall:    No tenderness or deformity   Heart:    Regular rate and rhythm, S1 and S2 normal, no murmur, rub or gallop  Breast Exam:    No tenderness, masses, or nipple abnormality  Abdomen:     Soft, non-tender, bowel sounds active all four quadrants, no masses, no organomegaly.    Genitalia:    Pelvic:external genitalia normal, vagina without lesions, discharge, or tenderness, rectovaginal septum  normal. Cervix normal in appearance, no cervical motion tenderness, no adnexal masses or tenderness.  Uterus normal size, shape, mobile, regular contours, nontender.  Rectal:    Normal external sphincter.  No hemorrhoids appreciated. Internal exam not done.   Extremities:   Extremities normal, atraumatic, no cyanosis or edema  Pulses:   2+ and symmetric all extremities  Skin:   Skin color, texture, turgor normal, no rashes or lesions  Lymph nodes:   Cervical,  supraclavicular, and axillary nodes normal  Neurologic:   CNII-XII intact, normal strength, sensation and reflexes throughout   .  Labs:  Lab Results  Component Value Date   WBC 5.4 02/09/2018   HGB 13.6 02/09/2018   HCT 39.8 02/09/2018   MCV 94.0 02/09/2018   PLT 305 02/09/2018    Lab Results  Component Value Date   CREATININE 0.89 12/30/2017   BUN 12 12/30/2017   NA 141 12/30/2017   K 4.7 12/30/2017   CL 105 12/30/2017   CO2 23 12/30/2017    Lab Results  Component Value Date   ALT 10 12/30/2017   AST 14 12/30/2017   ALKPHOS 58 12/30/2017   BILITOT 0.3 12/30/2017    Lab Results  Component Value Date   TSH 1.350 12/30/2017     Assessment:    Routine gynecologic exam.   Perimenopausal abnormal bleeding  S/p endometrial ablation Hypertension  Mild obesity Hypothyroidism History of COVID  Plan:     Blood tests: Labs pended. Is due to have labs by her Endocrinologist tomorrow, will also have annual labs done.  Breast self exam technique reviewed and patient encouraged to perform self-exam monthly. Contraception: tubal ligation. Discussed healthy lifestyle modifications. Mammogram up to date. Continue routine screening. Pap smear up to date. Continue routine screening.  HTN, currently on Lisinopril. Desires refill.  Hypothyroidism managed by Endocrinologist. Has visit scheduled tomorrow.  Discussion had regarding likely perimenopausal abnormal bleeding despite endometrial ablation. Has tried Aygestin without any relief of bleeding.  She does have some mild hot flushes.  Discussed option of trying HRT to balance hormone levels. Will prescribe Prempro. If still no improvement in symptoms, may need to consider IUD placement or definitive management with hysterectomy.  F/u in 1 year for annual exam. Follow up sooner if symptoms persist despite new treatment.     Rubie Maid, MD [facility]

## 2019-03-01 ENCOUNTER — Other Ambulatory Visit: Payer: Self-pay | Admitting: Obstetrics and Gynecology

## 2019-03-01 ENCOUNTER — Telehealth: Payer: Self-pay | Admitting: Obstetrics and Gynecology

## 2019-03-01 MED ORDER — NORETHINDRONE-ETH ESTRADIOL 1-5 MG-MCG PO TABS: 1.0000 | ORAL_TABLET | Freq: Every day | ORAL | 11 refills | Status: DC

## 2019-03-01 MED ORDER — IBUPROFEN 800 MG PO TABS: 800.0000 mg | ORAL_TABLET | Freq: Three times a day (TID) | ORAL | 1 refills | Status: DC | PRN

## 2019-03-01 NOTE — Telephone Encounter (Signed)
Patient called stating she was supposed get a script for ibuprofen 800 mg yesterday. She would like it to go to [facility] on [address].Thanks

## 2019-03-01 NOTE — Telephone Encounter (Signed)
The patient called and stated that the prescription that was sent in yesterday after her apt with [facility] is over 100$ and the patient is wanting to know if a cheaper medication can be sent in. Please advise.

## 2019-03-01 NOTE — Telephone Encounter (Signed)
Pt was called and spoke to her concerning her medication issues. Pt was advised that there are no discount cards for the prempro and I would send a message to [facility] informing her of the cost of the medication. Pt is aware that the ibuprofen refill will sent to her pharmacy.

## 2019-03-02 ENCOUNTER — Telehealth: Payer: Self-pay | Admitting: Obstetrics and Gynecology

## 2019-03-02 NOTE — Telephone Encounter (Signed)
Pt is aware that Dr. Marcelline Mates called in another pill for her to try. Pt stated that she has already spoke to the pharmacy and waiting on them to fill the medication to see how much it cost. Pt was advised that if the medication is too much to please contact the office on Tuesday since the office is closed on Monday and let us know. Pt stated that she understood.

## 2019-03-02 NOTE — Telephone Encounter (Signed)
Please see another phone encounter.  

## 2019-03-02 NOTE — Telephone Encounter (Signed)
The patient called and stated that she is wanting to know if the the patch that her and Dr. Marcelline Mates discussed will be a cheaper option compared to the pill. The patient is requesting a call back. Please advise.

## 2019-04-03 ENCOUNTER — Telehealth: Payer: Self-pay | Admitting: Obstetrics and Gynecology

## 2019-04-03 NOTE — Telephone Encounter (Signed)
Patient called stating she needs a refill on a script but shes not sure what its called. She states its in a little blue pack and has estrogen and testosterone in it. [facility] [address] Thanks

## 2019-04-03 NOTE — Telephone Encounter (Signed)
Advised pt to sign up on my chart. Once she gets home she can send me the name of the medication.   My chart activation sent.

## 2019-04-04 ENCOUNTER — Telehealth: Payer: Self-pay | Admitting: Obstetrics and Gynecology

## 2019-04-04 NOTE — Telephone Encounter (Signed)
Patient called stating the name of the medication she believes is Fyacolv or lupin. She stated she sent a mychart message but I did not see it in her chart. Thanks

## 2019-04-05 NOTE — Telephone Encounter (Signed)
LMTRC

## 2019-04-05 NOTE — Telephone Encounter (Signed)
Pt aware the names she gave me are not on her med list. I advised if it was the femhrt she has 11 refills per the prescription on 9/3.   Encouraged pt to contact the pharmacy. Pt voices understanding.

## 2019-04-21 DIAGNOSIS — Z23 Encounter for immunization: Secondary | ICD-10-CM | POA: Diagnosis not present

## 2019-04-23 ENCOUNTER — Other Ambulatory Visit: Payer: Self-pay | Admitting: Obstetrics and Gynecology

## 2019-07-17 ENCOUNTER — Telehealth: Payer: Self-pay

## 2019-07-17 NOTE — Telephone Encounter (Signed)
Pt states the med she was given for cramps is too expensive and did not work.  She went for 1 year w/o cycle. S/p ablation 2019.  Now she is having heavy bleeding and severe cramps.  Pls advise.

## 2019-07-23 ENCOUNTER — Telehealth: Payer: Self-pay | Admitting: Obstetrics and Gynecology

## 2019-07-23 NOTE — Telephone Encounter (Signed)
Pt called no answer LM via VM to call the office to speak more about her concerning for calling the office.  

## 2019-07-23 NOTE — Telephone Encounter (Signed)
Pt called in and stated that she is returning your call. Please advise

## 2019-07-27 MED ORDER — MEDROXYPROGESTERONE ACETATE 10 MG PO TABS: 10.0000 mg | ORAL_TABLET | Freq: Every day | ORAL | 0 refills | Status: DC

## 2019-07-27 NOTE — Addendum Note (Signed)
Addended by: Edwyna Shell on: 07/27/2019 04:10 PM   Modules accepted: Orders

## 2019-07-27 NOTE — Telephone Encounter (Signed)
Please see another encounter.

## 2019-07-27 NOTE — Telephone Encounter (Signed)
Pt stated that she is still having heavy bleeding. Pt stated that each medication given by [facility] did not help her and she was still bleeding. Pt was given Provera 10 mg x 10 days.

## 2019-08-08 ENCOUNTER — Telehealth: Payer: Self-pay | Admitting: Obstetrics and Gynecology

## 2019-08-08 NOTE — Telephone Encounter (Signed)
Pt called in and stated that the medroxy.  The pt stated that she thinks its working for her  The pt said [facility] [address] Please advise

## 2019-08-13 ENCOUNTER — Other Ambulatory Visit: Payer: Self-pay

## 2019-08-13 MED ORDER — MEDROXYPROGESTERONE ACETATE 10 MG PO TABS: 10.0000 mg | ORAL_TABLET | Freq: Every day | ORAL | 0 refills | Status: DC

## 2019-08-13 NOTE — Telephone Encounter (Signed)
Pt is aware that her medication has been [facility] [address] in [place].

## 2019-08-13 NOTE — Telephone Encounter (Signed)
Pt is aware that her medication has been [facility] [address]

## 2019-09-06 DIAGNOSIS — E669 Obesity, unspecified: Secondary | ICD-10-CM | POA: Diagnosis not present

## 2019-09-06 DIAGNOSIS — I10 Essential (primary) hypertension: Secondary | ICD-10-CM | POA: Diagnosis not present

## 2019-09-06 DIAGNOSIS — E049 Nontoxic goiter, unspecified: Secondary | ICD-10-CM | POA: Diagnosis not present

## 2019-09-06 DIAGNOSIS — E039 Hypothyroidism, unspecified: Secondary | ICD-10-CM | POA: Diagnosis not present

## 2019-09-06 DIAGNOSIS — E048 Other specified nontoxic goiter: Secondary | ICD-10-CM | POA: Diagnosis not present

## 2019-09-06 DIAGNOSIS — R1310 Dysphagia, unspecified: Secondary | ICD-10-CM | POA: Diagnosis not present

## 2019-09-10 DIAGNOSIS — Z23 Encounter for immunization: Secondary | ICD-10-CM | POA: Diagnosis not present

## 2019-09-13 DIAGNOSIS — R1310 Dysphagia, unspecified: Secondary | ICD-10-CM | POA: Diagnosis not present

## 2019-09-13 DIAGNOSIS — E669 Obesity, unspecified: Secondary | ICD-10-CM | POA: Diagnosis not present

## 2019-09-13 DIAGNOSIS — E048 Other specified nontoxic goiter: Secondary | ICD-10-CM | POA: Diagnosis not present

## 2019-09-13 DIAGNOSIS — Z6834 Body mass index (BMI) 34.0-34.9, adult: Secondary | ICD-10-CM | POA: Diagnosis not present

## 2019-09-13 DIAGNOSIS — E039 Hypothyroidism, unspecified: Secondary | ICD-10-CM | POA: Diagnosis not present

## 2019-10-15 DIAGNOSIS — Z23 Encounter for immunization: Secondary | ICD-10-CM | POA: Diagnosis not present

## 2019-12-27 ENCOUNTER — Other Ambulatory Visit: Payer: Self-pay | Admitting: Obstetrics and Gynecology

## 2019-12-27 NOTE — Telephone Encounter (Signed)
Pt called in and stated that she need a refill on her lisinopril   she to the [facility] on [address]. I told the pt that we already have a request in.

## 2019-12-28 MED ORDER — LISINOPRIL 5 MG PO TABS: 5.0000 mg | ORAL_TABLET | Freq: Every day | ORAL | 0 refills | Status: DC

## 2020-02-29 ENCOUNTER — Encounter: Payer: PRIVATE HEALTH INSURANCE | Admitting: Obstetrics and Gynecology

## 2020-04-29 NOTE — Patient Instructions (Addendum)
Preventive Care 40-53 Years Old, Female °Preventive care refers to visits with your health care provider and lifestyle choices that can promote health and wellness. This includes: °· A yearly physical exam. This may also be called an annual well check. °· Regular dental visits and eye exams. °· Immunizations. °· Screening for certain conditions. °· Healthy lifestyle choices, such as eating a healthy diet, getting regular exercise, not using drugs or products that contain nicotine and tobacco, and limiting alcohol use. °What can I expect for my preventive care visit? °Physical exam °Your health care provider will check your: °· Height and weight. This may be used to calculate body mass index (BMI), which tells if you are at a healthy weight. °· Heart rate and blood pressure. °· Skin for abnormal spots. °Counseling °Your health care provider may ask you questions about your: °· Alcohol, tobacco, and drug use. °· Emotional well-being. °· Home and relationship well-being. °· Sexual activity. °· Eating habits. °· Work and work environment. °· Method of birth control. °· Menstrual cycle. °· Pregnancy history. °What immunizations do I need? ° °Influenza (flu) vaccine °· This is recommended every year. °Tetanus, diphtheria, and pertussis (Tdap) vaccine °· You may need a Td booster every 10 years. °Varicella (chickenpox) vaccine °· You may need this if you have not been vaccinated. °Zoster (shingles) vaccine °· You may need this after age 60. °Measles, mumps, and rubella (MMR) vaccine °· You may need at least one dose of MMR if you were born in 1957 or later. You may also need a second dose. °Pneumococcal conjugate (PCV13) vaccine °· You may need this if you have certain conditions and were not previously vaccinated. °Pneumococcal polysaccharide (PPSV23) vaccine °· You may need one or two doses if you smoke cigarettes or if you have certain conditions. °Meningococcal conjugate (MenACWY) vaccine °· You may need this if you  have certain conditions. °Hepatitis A vaccine °· You may need this if you have certain conditions or if you travel or work in places where you may be exposed to hepatitis A. °Hepatitis B vaccine °· You may need this if you have certain conditions or if you travel or work in places where you may be exposed to hepatitis B. °Haemophilus influenzae type b (Hib) vaccine °· You may need this if you have certain conditions. °Human papillomavirus (HPV) vaccine °· If recommended by your health care provider, you may need three doses over 6 months. °You may receive vaccines as individual doses or as more than one vaccine together in one shot (combination vaccines). Talk with your health care provider about the risks and benefits of combination vaccines. °What tests do I need? °Blood tests °· Lipid and cholesterol levels. These may be checked every 5 years, or more frequently if you are over 50 years old. °· Hepatitis C test. °· Hepatitis B test. °Screening °· Lung cancer screening. You may have this screening every year starting at age 55 if you have a 30-pack-year history of smoking and currently smoke or have quit within the past 15 years. °· Colorectal cancer screening. All adults should have this screening starting at age 50 and continuing until age 75. Your health care provider may recommend screening at age 45 if you are at increased risk. You will have tests every 1-10 years, depending on your results and the type of screening test. °· Diabetes screening. This is done by checking your blood sugar (glucose) after you have not eaten for a while (fasting). You may have this   done every 1-3 years.  Mammogram. This may be done every 1-2 years. Talk with your health care provider about when you should start having regular mammograms. This may depend on whether you have a family history of breast cancer.  BRCA-related cancer screening. This may be done if you have a family history of breast, ovarian, tubal, or peritoneal  cancers.  Pelvic exam and Pap test. This may be done every 3 years starting at age 21. Starting at age 30, this may be done every 5 years if you have a Pap test in combination with an HPV test. Other tests  Sexually transmitted disease (STD) testing.  Bone density scan. This is done to screen for osteoporosis. You may have this scan if you are at high risk for osteoporosis. Follow these instructions at home: Eating and drinking  Eat a diet that includes fresh fruits and vegetables, whole grains, lean protein, and low-fat dairy.  Take vitamin and mineral supplements as recommended by your health care provider.  Do not drink alcohol if: ? Your health care provider tells you not to drink. ? You are pregnant, may be pregnant, or are planning to become pregnant.  If you drink alcohol: ? Limit how much you have to 0-1 drink a day. ? Be aware of how much alcohol is in your drink. In the [place] one drink equals one 12 oz bottle of beer (355 mL), one 5 oz glass of wine (148 mL), or one 1 oz glass of hard liquor (44 mL). Lifestyle  Take daily care of your teeth and gums.  Stay active. Exercise for at least 30 minutes on 5 or more days each week.  Do not use any products that contain nicotine or tobacco, such as cigarettes, e-cigarettes, and chewing tobacco. If you need help quitting, ask your health care provider.  If you are sexually active, practice safe sex. Use a condom or other form of birth control (contraception) in order to prevent pregnancy and STIs (sexually transmitted infections).  If told by your health care provider, take low-dose aspirin daily starting at age 50. What's next?  Visit your health care provider once a year for a well check visit.  Ask your health care provider how often you should have your eyes and teeth checked.  Stay up to date on all vaccines. This information is not intended to replace advice given to you by your health care provider. Make sure you  discuss any questions you have with your health care provider. Document Revised: 02/23/2018 Document Reviewed: 02/23/2018 Elsevier Patient Education  2020 Elsevier Inc. Breast Self-Awareness Breast self-awareness is knowing how your breasts look and feel. Doing breast self-awareness is important. It allows you to catch a breast problem early while it is still small and can be treated. All women should do breast self-awareness, including women who have had breast implants. Tell your doctor if you notice a change in your breasts. What you need:  A mirror.  A well-lit room. How to do a breast self-exam A breast self-exam is one way to learn what is normal for your breasts and to check for changes. To do a breast self-exam: Look for changes  1. Take off all the clothes above your waist. 2. Stand in front of a mirror in a room with good lighting. 3. Put your hands on your hips. 4. Push your hands down. 5. Look at your breasts and nipples in the mirror to see if one breast or nipple looks different from the   other. Check to see if: ? The shape of one breast is different. ? The size of one breast is different. ? There are wrinkles, dips, and bumps in one breast and not the other. 6. Look at each breast for changes in the skin, such as: ? Redness. ? Scaly areas. 7. Look for changes in your nipples, such as: ? Liquid around the nipples. ? Bleeding. ? Dimpling. ? Redness. ? A change in where the nipples are. Feel for changes  1. Lie on your back on the floor. 2. Feel each breast. To do this, follow these steps: ? Pick a breast to feel. ? Put the arm closest to that breast above your head. ? Use your other arm to feel the nipple area of your breast. Feel the area with the pads of your three middle fingers by making small circles with your fingers. For the first circle, press lightly. For the second circle, press harder. For the third circle, press even harder. ? Keep making circles with  your fingers at the different pressures as you move down your breast. Stop when you feel your ribs. ? Move your fingers a little toward the center of your body. ? Start making circles with your fingers again, this time going up until you reach your collarbone. ? Keep making up-and-down circles until you reach your armpit. Remember to keep using the three pressures. ? Feel the other breast in the same way. 3. Sit or stand in the tub or shower. 4. With soapy water on your skin, feel each breast the same way you did in step 2 when you were lying on the floor. Write down what you find Writing down what you find can help you remember what to tell your doctor. Write down:  What is normal for each breast.  Any changes you find in each breast, including: ? The kind of changes you find. ? Whether you have pain. ? Size and location of any lumps.  When you last had your menstrual period. General tips  Check your breasts every month.  If you are breastfeeding, the best time to check your breasts is after you feed your baby or after you use a breast pump.  If you get menstrual periods, the best time to check your breasts is 5-7 days after your menstrual period is over.  With time, you will become comfortable with the self-exam, and you will begin to know if there are changes in your breasts. Contact a doctor if you:  See a change in the shape or size of your breasts or nipples.  See a change in the skin of your breast or nipples, such as red or scaly skin.  Have fluid coming from your nipples that is not normal.  Find a lump or thick area that was not there before.  Have pain in your breasts.  Have any concerns about your breast health. Summary  Breast self-awareness includes looking for changes in your breasts, as well as feeling for changes within your breasts.  Breast self-awareness should be done in front of a mirror in a well-lit room.  You should check your breasts every month.  If you get menstrual periods, the best time to check your breasts is 5-7 days after your menstrual period is over.  Let your doctor know of any changes you see in your breasts, including changes in size, changes on the skin, pain or tenderness, or fluid from your nipples that is not normal. This information is not   intended to replace advice given to you by your health care provider. Make sure you discuss any questions you have with your health care provider. Document Revised: 01/31/2018 Document Reviewed: 01/31/2018 Elsevier Patient Education  2020 Elsevier Inc.  

## 2020-04-29 NOTE — Progress Notes (Signed)
Pt present for annual exam. Pt stated that she was doing well. Pt  Up to date with all vaccines flu/COVID, pap and colon screening. Mammogram due and order placed.

## 2020-04-30 ENCOUNTER — Other Ambulatory Visit: Payer: Self-pay

## 2020-04-30 ENCOUNTER — Ambulatory Visit (INDEPENDENT_AMBULATORY_CARE_PROVIDER_SITE_OTHER): Payer: 59 | Admitting: Obstetrics and Gynecology

## 2020-04-30 ENCOUNTER — Encounter: Payer: Self-pay | Admitting: Obstetrics and Gynecology

## 2020-04-30 VITALS — BP 164/100 | HR 88 | Ht 65.0 in | Wt 204.8 lb

## 2020-04-30 DIAGNOSIS — N951 Menopausal and female climacteric states: Secondary | ICD-10-CM

## 2020-04-30 DIAGNOSIS — Z1231 Encounter for screening mammogram for malignant neoplasm of breast: Secondary | ICD-10-CM

## 2020-04-30 DIAGNOSIS — E669 Obesity, unspecified: Secondary | ICD-10-CM

## 2020-04-30 DIAGNOSIS — Z131 Encounter for screening for diabetes mellitus: Secondary | ICD-10-CM

## 2020-04-30 DIAGNOSIS — I10 Essential (primary) hypertension: Secondary | ICD-10-CM

## 2020-04-30 DIAGNOSIS — E039 Hypothyroidism, unspecified: Secondary | ICD-10-CM

## 2020-04-30 DIAGNOSIS — Z8616 Personal history of COVID-19: Secondary | ICD-10-CM

## 2020-04-30 DIAGNOSIS — Z13220 Encounter for screening for lipoid disorders: Secondary | ICD-10-CM

## 2020-04-30 DIAGNOSIS — Z01419 Encounter for gynecological examination (general) (routine) without abnormal findings: Secondary | ICD-10-CM

## 2020-04-30 MED ORDER — LISINOPRIL 5 MG PO TABS: 5.0000 mg | ORAL_TABLET | Freq: Every day | ORAL | 3 refills | Status: DC

## 2020-04-30 NOTE — Progress Notes (Signed)
GYNECOLOGY ANNUAL PHYSICAL EXAM PROGRESS NOTE  Subjective:    Ashley [name] is a 54 y.o. [id number] female who presents for an annual exam.The patient is sexually active.The patient wears seatbelts: yes. The patient participates in regular exercise: no. Has the patient ever been transfused or tattooed?: yes. The patient reports that there is not domestic violence in her life.    The patient has the following complaints today: 1. Patient still noting some mild hot flushes more so at night. States she is not taking Prempo anymore. States she does not want to treat with medical management at this time. 2. Improved DUB, states she has not had any bleeding for the past few months.   3. Elevated blood pressure in office today (164/100). She states this is due to a busy morning. States she takes her BP at home and it typically runs 120/74.  4. Lastly, patient reports h/o COVID infection several months ago. Had completed vaccination series back in April.   Gynecologic History  Menarche age: 27 No LMP recorded. Patient is perimenopausal. Menstrual cycles infrequent. Also has h/o endometrial ablation.  Contraception: tubal ligation History of STI's: Denies Last Pap: 12/30/2017. Results were: normal.  Denies h/o abnormal pap smears. Last mammogram: 02/27/2019. Results were: normal Last colonoscopy: 11/2015. Results were: normal.    OB History  Gravida Para Term Preterm AB Living  1 1 1  0 0 1  SAB TAB Ectopic Multiple Live Births  0 0 0 0 1    # Outcome Date GA Lbr Len/2nd Weight Sex Delivery Anes PTL Lv  1 Term 1992 [redacted]w[redacted]d  6 lb 7 oz (2.92 kg) F Vag-Spont   LIV    Past Medical History:  Diagnosis Date  . Hypothyroidism   . Thyroid enlarged    pt reports "being watched"    Past Surgical History:  Procedure Laterality Date  . COLONOSCOPY WITH PROPOFOL N/A 12/26/2015   Procedure: COLONOSCOPY WITH PROPOFOL;  Surgeon: Lucilla Lame, MD;  Location: [place];  Service: Endoscopy;   Laterality: N/A;  . DILATATION & CURETTAGE/HYSTEROSCOPY WITH MYOSURE N/A 02/13/2018   Procedure: DILATATION & CURETTAGE/HYSTEROSCOPY WITH GENESYSN HTA THERMAL ABLATION;  Surgeon: Rubie Maid, MD;  Location: [facility];  Service: Gynecology;  Laterality: N/A;  . EXCISION MASS NECK Right 05/27/2015   Procedure: EXCISION MASS NECK RIGHT LIPOMA;  Surgeon: Clyde Canterbury, MD;  Location: [facility];  Service: ENT;  Laterality: Right;  . TONSILLECTOMY    . TUBAL LIGATION      Family History  Problem Relation Age of Onset  . Hypertension Mother   . Hypertension Father   . Diabetes Father     Social History   Socioeconomic History  . Marital status: Single    Spouse name: Not on file  . Number of children: Not on file  . Years of education: Not on file  . Highest education level: Not on file  Occupational History  . Not on file  Tobacco Use  . Smoking status: Never Smoker  . Smokeless tobacco: Never Used  Vaping Use  . Vaping Use: Never used  Substance and Sexual Activity  . Alcohol use: Not Currently    Comment: OCC  . Drug use: No  . Sexual activity: Yes    Birth control/protection: None, Surgical  Other Topics Concern  . Not on file  Social History Narrative  . Not on file   Social Determinants of Health   Financial Resource Strain:   . Difficulty of  Paying Living Expenses: Not on file  Food Insecurity:   . Worried About Charity fundraiser in the Last Year: Not on file  . Ran Out of Food in the Last Year: Not on file  Transportation Needs:   . Lack of Transportation (Medical): Not on file  . Lack of Transportation (Non-Medical): Not on file  Physical Activity:   . Days of Exercise per Week: Not on file  . Minutes of Exercise per Session: Not on file  Stress:   . Feeling of Stress : Not on file  Social Connections:   . Frequency of Communication with Friends and Family: Not on file  . Frequency of Social Gatherings with Friends and Family: Not on file  .  Attends Religious Services: Not on file  . Active Member of Clubs or Organizations: Not on file  . Attends Archivist Meetings: Not on file  . Marital Status: Not on file  Intimate Partner Violence:   . Fear of Current or Ex-Partner: Not on file  . Emotionally Abused: Not on file  . Physically Abused: Not on file  . Sexually Abused: Not on file    Current Outpatient Medications on File Prior to Visit  Medication Sig Dispense Refill  . acetaminophen (TYLENOL) 500 MG tablet Take 500-1,000 mg by mouth daily as needed for moderate pain or headache.    . Biotin 5000 MCG CAPS Take 5,000 mcg by mouth daily.    Marland Kitchen ibuprofen (ADVIL) 800 MG tablet Take 1 tablet (800 mg total) by mouth every 8 (eight) hours as needed for moderate pain. 60 tablet 1  . IRON PO Take 1 tablet by mouth daily.    Marland Kitchen levothyroxine (SYNTHROID, LEVOTHROID) 50 MCG tablet Take 50 mcg by mouth daily before breakfast.    . lisinopril (ZESTRIL) 5 MG tablet Take 1 tablet (5 mg total) by mouth daily. 90 tablet 0  . Melatonin 5 MG TABS Take 5 mg by mouth daily as needed (sleep).    . Multiple Vitamin (MULTIVITAMIN) tablet Take 1 tablet by mouth daily.    Marland Kitchen VITAMIN D PO Take by mouth.    . medroxyPROGESTERone (PROVERA) 10 MG tablet Take 1 tablet (10 mg total) by mouth daily. (Patient not taking: Reported on 04/30/2020) 10 tablet 0  . norethindrone (AYGESTIN) 5 MG tablet Take 1 tablet (5 mg total) by mouth daily. Take once tablet daily for 3 months. (Patient not taking: Reported on 04/30/2020) 30 tablet 3  . [DISCONTINUED] lisinopril (ZESTRIL) 5 MG tablet Take 1 tablet (5 mg total) by mouth daily. 90 tablet 3   No current facility-administered medications on file prior to visit.    No Known Allergies   Review of Systems Constitutional: negative for chills, fatigue, fevers.  Positive for mild hot flushes and sweats Eyes: negative for irritation, redness and visual disturbance Ears, nose, mouth, throat, and face: negative  for hearing loss, nasal congestion, snoring and tinnitus Respiratory: negative for asthma, cough, sputum Cardiovascular: negative for chest pain, dyspnea, exertional chest pressure/discomfort, irregular heart beat, palpitations and syncope Gastrointestinal: negative for abdominal pain, change in bowel habits, nausea and vomiting Genitourinary:  Negative for genital lesions, abnormal uterine bleeding,sexual problems and vaginal discharge, dysuria and urinary incontinence Integument/breast: negative for breast lump, breast tenderness and nipple discharge Hematologic/lymphatic: negative for bleeding and easy bruising Musculoskeletal:negative for back pain and muscle weakness Neurological: negative for dizziness, headaches, vertigo and weakness Endocrine: negative for diabetic symptoms including polydipsia, polyuria and skin dryness Allergic/Immunologic: negative for  hay fever and urticaria       Objective:  Blood pressure (!) 164/100, pulse 88, height 5\' 5"  (1.651 m), weight 204 lb 12.8 oz (92.9 kg). Body mass index is 34.08 kg/m. Repeat BP (at end of visit): 170/88   General Appearance:    Alert, cooperative, no distress, appears stated age, mild obesity  Head:    Normocephalic, without obvious abnormality, atraumatic  Eyes:    PERRL, conjunctiva/corneas clear, EOM's intact, both eyes  Ears:    Normal external ear canals, both ears  Nose:   Nares normal, septum midline, mucosa normal, no drainage or sinus tenderness  Throat:   Lips, mucosa, and tongue normal; teeth and gums normal  Neck:   Supple, symmetrical, trachea midline, no adenopathy; thyroid: no enlargement/tenderness/nodules; no carotid bruit or JVD  Back:     Symmetric, no curvature, ROM normal, no CVA tenderness  Lungs:     Clear to auscultation bilaterally, respirations unlabored  Chest Wall:    No tenderness or deformity   Heart:    Regular rate and rhythm, S1 and S2 normal, no murmur, rub or gallop  Breast Exam:    No  tenderness, masses, or nipple abnormality  Abdomen:     Soft, non-tender, bowel sounds active all four quadrants, no masses, no organomegaly.    Genitalia:    Pelvic:external genitalia normal, vagina without lesions, discharge, or tenderness, rectovaginal septum  normal. Cervix normal in appearance, no cervical motion tenderness, no adnexal masses or tenderness.  Uterus normal size, shape, mobile, regular contours, nontender.  Rectal:    Normal external sphincter.  No hemorrhoids appreciated. Internal exam not done.   Extremities:   Extremities normal, atraumatic, no cyanosis or edema  Pulses:   2+ and symmetric all extremities  Skin:   Skin color, texture, turgor normal, no rashes or lesions  Lymph nodes:   Cervical, supraclavicular, and axillary nodes normal  Neurologic:   CNII-XII intact, normal strength, sensation and reflexes throughout   .  Labs:  Lab Results  Component Value Date   WBC 5.4 02/09/2018   HGB 13.6 02/09/2018   HCT 39.8 02/09/2018   MCV 94.0 02/09/2018   PLT 305 02/09/2018    Lab Results  Component Value Date   CREATININE 0.89 12/30/2017   BUN 12 12/30/2017   NA 141 12/30/2017   K 4.7 12/30/2017   CL 105 12/30/2017   CO2 23 12/30/2017    Lab Results  Component Value Date   ALT 10 12/30/2017   AST 14 12/30/2017   ALKPHOS 58 12/30/2017   BILITOT 0.3 12/30/2017    Lab Results  Component Value Date   TSH 1.350 12/30/2017     Assessment:    Routine gynecologic exam.   Perimenopausal vasomotor syndrome Hypertension Obesity Acquired Hypothyroidism History of COVID  Plan:  Routine Gynecologic Exam: -Blood tests ordered: CBC, CMP, lipids, and A1c. Reports having thyroid levels checked with her Endocrinologist last month.  -Breast self exam technique reviewed and patient encouraged to perform self-exam monthly. -Contraception: tubal ligation. -Discussed healthy lifestyle modifications. -Mammogram ordered- last 02/27/2019 -Pap smear up to date.  Continue routine screening. Due in 1 year.  - Flu vaccination completed last month.   Perimenopausal Vasomotor Syndrome:  -Patient is still experiencing hot flashes at night, but denies them during the day. She declines treatment with medical management or supplements at this time.   Obesity: -Patient remains active at work. Encouraged healthy lifestyle modifications.   Hypertension: -HTN, currently on Lisinopril. Patient's  BP remained elevated while in office today initial BP 164/100 and repeat at end of visit 170/88. Patient states she has the ability to check her blood pressure at home and will check it later today and send a my chart message with results. Also, advised her to keep track of her blood pressures for the next two weeks and reduce her sodium intake. If it stays elevated, will consider clonidine as this may help her hot flashes as well as her blood pressure. Other option would be to increase her Lisinopril dose.   Acquired Hypothyroidism: -Hypothyroidism managed by Endocrinologist.   History of COVID: -Patient has recovered well, and has no ongoing problems. Vaccination series completed, can consider booster when eligible.   Follow up: 1 year for annual exam. Follow up sooner if symptoms persist and would like to consider medical management.      Rubie Maid, MD [facility]

## 2020-05-01 ENCOUNTER — Telehealth: Payer: Self-pay

## 2020-05-01 LAB — COMPREHENSIVE METABOLIC PANEL
ALT: 13 IU/L (ref 0–32)
AST: 18 IU/L (ref 0–40)
Albumin/Globulin Ratio: 1.6 (ref 1.2–2.2)
Albumin: 4.6 g/dL (ref 3.8–4.9)
Alkaline Phosphatase: 87 IU/L (ref 44–121)
BUN/Creatinine Ratio: 13 (ref 9–23)
BUN: 12 mg/dL (ref 6–24)
Bilirubin Total: <0.2 mg/dL (ref 0.0–1.2)
CO2: 25 mmol/L (ref 20–29)
Calcium: 9.2 mg/dL (ref 8.7–10.2)
Chloride: 103 mmol/L (ref 96–106)
Creatinine, Ser: 0.93 mg/dL (ref 0.57–1.00)
GFR calc Af Amer: 81 mL/min/{1.73_m2} (ref >59)
GFR calc non Af Amer: 70 mL/min/{1.73_m2} (ref >59)
Globulin, Total: 2.8 g/dL (ref 1.5–4.5)
Glucose: 89 mg/dL (ref 65–99)
Potassium: 4.5 mmol/L (ref 3.5–5.2)
Sodium: 140 mmol/L (ref 134–144)
Total Protein: 7.4 g/dL (ref 6.0–8.5)

## 2020-05-01 LAB — LIPID PANEL
Chol/HDL Ratio: 2.3 ratio (ref 0.0–4.4)
Cholesterol, Total: 303 mg/dL — ABNORMAL HIGH (ref 100–199)
HDL: 133 mg/dL (ref >39)
LDL Chol Calc (NIH): 162 mg/dL — ABNORMAL HIGH (ref 0–99)
Triglycerides: 56 mg/dL (ref 0–149)
VLDL Cholesterol Cal: 8 mg/dL (ref 5–40)

## 2020-05-01 LAB — CBC
Hematocrit: 37 % (ref 34.0–46.6)
Hemoglobin: 12.6 g/dL (ref 11.1–15.9)
MCH: 30.9 pg (ref 26.6–33.0)
MCHC: 34.1 g/dL (ref 31.5–35.7)
MCV: 91 fL (ref 79–97)
Platelets: 284 10*3/uL (ref 150–450)
RBC: 4.08 x10E6/uL (ref 3.77–5.28)
RDW: 12.5 % (ref 11.7–15.4)
WBC: 4.6 10*3/uL (ref 3.4–10.8)

## 2020-05-01 LAB — HEMOGLOBIN A1C
Est. average glucose Bld gHb Est-mCnc: 123 mg/dL
Hgb A1c MFr Bld: 5.9 % — ABNORMAL HIGH (ref 4.8–5.6)

## 2020-05-01 NOTE — Telephone Encounter (Signed)
Pt called in and stated that her BP was 140/124 the pt is requesting a call back from the nurse. The pt is asking for meds to be upped for her BP.Marland Kitchen The pt is also requesting a refill on her ibuprofen filled. The pt is requesting the meds sent to her home address that she was told that was an option. 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 To please leave a message. Please advise

## 2020-05-02 NOTE — Telephone Encounter (Signed)
Pt called and spoke to her concerning her elevated bp and [facility] suggested that she make an appointment with her PCP immediately. Pt voiced that she understood. Pt is aware that I will send information concerning bp to her mychart. Pt was advised to continue to take her bp medication as prescribed and to avoid pork, salty foods, and other foods that would increase her bp levels. Pt denies any headaches or dizziness.

## 2020-05-02 NOTE — Telephone Encounter (Signed)
Patient is calling in stating that she has not heard back from her provider, informed the patient that her provider was out of the office today and returns Tuesday. Informed patient to allow provider 24-48 hours to give her a call back from the time she called in. Patient verbalized understanding.

## 2020-05-07 ENCOUNTER — Other Ambulatory Visit: Payer: Self-pay

## 2020-05-08 ENCOUNTER — Ambulatory Visit (INDEPENDENT_AMBULATORY_CARE_PROVIDER_SITE_OTHER): Payer: 59 | Admitting: Family Medicine

## 2020-05-08 ENCOUNTER — Other Ambulatory Visit: Payer: Self-pay

## 2020-05-08 ENCOUNTER — Encounter: Payer: Self-pay | Admitting: Family Medicine

## 2020-05-08 VITALS — BP 138/84 | HR 91 | Ht 67.0 in | Wt 201.7 lb

## 2020-05-08 DIAGNOSIS — E669 Obesity, unspecified: Secondary | ICD-10-CM

## 2020-05-08 DIAGNOSIS — I10 Essential (primary) hypertension: Secondary | ICD-10-CM

## 2020-05-08 MED ORDER — LISINOPRIL 10 MG PO TABS: 5.0000 mg | ORAL_TABLET | Freq: Every day | ORAL | 4 refills | Status: DC

## 2020-05-08 NOTE — Assessment & Plan Note (Signed)
Has had a few elevated BP reading and wanted to talk about increasing Lisinopril to 10 mg. BP manual was 136/86, Will increase medication and fu in 2 weeks.

## 2020-05-08 NOTE — Progress Notes (Signed)
Established Patient Office Visit  SUBJECTIVE:  Subjective  Patient ID: Ashley [name], female    DOB: March 12, 1966  Age: 54 y.o. MRN: [id number]  CC:  Chief Complaint  Patient presents with   Hypertension    patient having elevated bp and headaches x 2 weeks    HPI Ashley [name] is a 54 y.o. female presenting today for Htn and obesity   Has had 2 episodes of HTN 170/90  Past Medical History:  Diagnosis Date   Hypothyroidism    Thyroid enlarged    pt reports "being watched"    Past Surgical History:  Procedure Laterality Date   COLONOSCOPY WITH PROPOFOL N/A 12/26/2015   Procedure: COLONOSCOPY WITH PROPOFOL;  Surgeon: Lucilla Lame, MD;  Location: [place];  Service: Endoscopy;  Laterality: N/A;   DILATATION & CURETTAGE/HYSTEROSCOPY WITH MYOSURE N/A 02/13/2018   Procedure: DILATATION & CURETTAGE/HYSTEROSCOPY WITH GENESYSN HTA THERMAL ABLATION;  Surgeon: Rubie Maid, MD;  Location: [facility];  Service: Gynecology;  Laterality: N/A;   EXCISION MASS NECK Right 05/27/2015   Procedure: EXCISION MASS NECK RIGHT LIPOMA;  Surgeon: Clyde Canterbury, MD;  Location: [place];  Service: ENT;  Laterality: Right;   TONSILLECTOMY     TUBAL LIGATION      Family History  Problem Relation Age of Onset   Hypertension Mother    Hypertension Father    Diabetes Father     Social History   Socioeconomic History   Marital status: Single    Spouse name: Not on file   Number of children: Not on file   Years of education: Not on file   Highest education level: Not on file  Occupational History   Not on file  Tobacco Use   Smoking status: Never Smoker   Smokeless tobacco: Never Used  Vaping Use   Vaping Use: Never used  Substance and Sexual Activity   Alcohol use: Yes    Comment: OCC   Drug use: No   Sexual activity: Yes    Birth control/protection: None, Surgical  Other Topics Concern   Not on file  Social History Narrative    Not on file   Social Determinants of Health   Financial Resource Strain:    Difficulty of Paying Living Expenses: Not on file  Food Insecurity:    Worried About [employer] in the Last Year: Not on file   [employer] of Food in the Last Year: Not on file  Transportation Needs:    Lack of Transportation (Medical): Not on file   Lack of Transportation (Non-Medical): Not on file  Physical Activity:    Days of Exercise per Week: Not on file   Minutes of Exercise per Session: Not on file  Stress:    Feeling of Stress : Not on file  Social Connections:    Frequency of Communication with Friends and Family: Not on file   Frequency of Social Gatherings with Friends and Family: Not on file   Attends Religious Services: Not on file   Active Member of Clubs or Organizations: Not on file   Attends Archivist Meetings: Not on file   Marital Status: Not on file  Intimate Partner Violence:    Fear of Current or Ex-Partner: Not on file   Emotionally Abused: Not on file   Physically Abused: Not on file   Sexually Abused: Not on file     Current Outpatient Medications:    acetaminophen (TYLENOL) 500 MG tablet, Take  500-1,000 mg by mouth daily as needed for moderate pain or headache., Disp: , Rfl:    Biotin 5000 MCG CAPS, Take 5,000 mcg by mouth daily., Disp: , Rfl:    ibuprofen (ADVIL) 800 MG tablet, Take 1 tablet (800 mg total) by mouth every 8 (eight) hours as needed for moderate pain., Disp: 60 tablet, Rfl: 1   IRON PO, Take 1 tablet by mouth daily., Disp: , Rfl:    levothyroxine (SYNTHROID, LEVOTHROID) 50 MCG tablet, Take 50 mcg by mouth daily before breakfast., Disp: , Rfl:    lisinopril (ZESTRIL) 10 MG tablet, Take 0.5 tablets (5 mg total) by mouth daily., Disp: 30 tablet, Rfl: 4   medroxyPROGESTERone (PROVERA) 10 MG tablet, Take 1 tablet (10 mg total) by mouth daily., Disp: 10 tablet, Rfl: 0   Melatonin 5 MG TABS, Take 5 mg by mouth daily as needed  (sleep)., Disp: , Rfl:    Multiple Vitamin (MULTIVITAMIN) tablet, Take 1 tablet by mouth daily., Disp: , Rfl:    norethindrone (AYGESTIN) 5 MG tablet, Take 1 tablet (5 mg total) by mouth daily. Take once tablet daily for 3 months., Disp: 30 tablet, Rfl: 3   VITAMIN D PO, Take by mouth., Disp: , Rfl:    No Known Allergies  ROS Review of Systems  HENT: Negative.   Cardiovascular: Negative.   Gastrointestinal: Negative.   Genitourinary: Negative.   Musculoskeletal: Negative.   Neurological: Negative.   All other systems reviewed and are negative.    OBJECTIVE:    Physical Exam Constitutional:      Appearance: She is obese.  HENT:     Head: Normocephalic.     Nose: Nose normal.     Mouth/Throat:     Mouth: Mucous membranes are moist.  Eyes:     Pupils: Pupils are equal, round, and reactive to light.  Cardiovascular:     Rate and Rhythm: Normal rate and regular rhythm.  Pulmonary:     Effort: Pulmonary effort is normal.  Abdominal:     General: Abdomen is flat.  Musculoskeletal:        General: Normal range of motion.  Neurological:     Mental Status: She is alert.  Psychiatric:        Mood and Affect: Mood normal.     BP 138/84    Pulse 91    Ht 5\' 7"  (1.702 m)    Wt 201 lb 11.2 oz (91.5 kg)    BMI 31.59 kg/m  Wt Readings from Last 3 Encounters:  05/08/20 201 lb 11.2 oz (91.5 kg)  04/30/20 204 lb 12.8 oz (92.9 kg)  02/28/19 190 lb 1.6 oz (86.2 kg)    Health Maintenance Due  Topic Date Due   Hepatitis C Screening  Never done   TETANUS/TDAP  Never done    There are no preventive care reminders to display for this patient.  CBC Latest Ref Rng & Units 04/30/2020 02/09/2018 12/30/2017  WBC 3.4 - 10.8 x10E3/uL 4.6 5.4 3.9  Hemoglobin 11.1 - 15.9 g/dL 12.6 13.6 12.9  Hematocrit 34.0 - 46.6 % 37.0 39.8 37.5  Platelets 150 - 450 x10E3/uL 284 305 319   CMP Latest Ref Rng & Units 04/30/2020 12/30/2017 08/12/2017  Glucose 65 - 99 mg/dL 89 85 123(H)  BUN 6 - 24 mg/dL  12 12 10   Creatinine 0.57 - 1.00 mg/dL 0.93 0.89 0.86  Sodium 134 - 144 mmol/L 140 141 138  Potassium 3.5 - 5.2 mmol/L 4.5 4.7 4.1  Chloride 96 - 106 mmol/L 103 105 105  CO2 20 - 29 mmol/L 25 23 22   Calcium 8.7 - 10.2 mg/dL 9.2 9.4 9.0  Total Protein 6.0 - 8.5 g/dL 7.4 6.9 -  Total Bilirubin 0.0 - 1.2 mg/dL <0.2 0.3 -  Alkaline Phos 44 - 121 IU/L 87 58 -  AST 0 - 40 IU/L 18 14 -  ALT 0 - 32 IU/L 13 10 -    Lab Results  Component Value Date   TSH 1.350 12/30/2017   Lab Results  Component Value Date   ALBUMIN 4.6 04/30/2020   ANIONGAP 11 08/12/2017   Lab Results  Component Value Date   CHOL 303 (H) 04/30/2020   CHOL 277 (H) 12/30/2017   CHOL 237 (H) 11/04/2015   HDL 133 04/30/2020   HDL 138 12/30/2017   HDL 125 11/04/2015   LDLCALC 162 (H) 04/30/2020   LDLCALC 130 (H) 12/30/2017   LDLCALC 100 (H) 11/04/2015   CHOLHDL 2.3 04/30/2020   CHOLHDL 2.0 12/30/2017   CHOLHDL 1.9 11/04/2015   Lab Results  Component Value Date   TRIG 56 04/30/2020   Lab Results  Component Value Date   HGBA1C 5.9 (H) 04/30/2020   HGBA1C 5.8 (H) 11/04/2015      ASSESSMENT & PLAN:   Problem List Items Addressed This Visit      Cardiovascular and Mediastinum   Essential hypertension - Primary    Has had a few elevated BP reading and wanted to talk about increasing Lisinopril to 10 mg. BP manual was 136/86, Will increase medication and fu in 2 weeks.       Relevant Medications   lisinopril (ZESTRIL) 10 MG tablet     Other   Obesity (BMI 30.0-34.9)    - Pt is obesity I = 30-34.9 - activation or motivation to change monitored Discussed low carb diet, intermittent fasting, fu 2 weeks.          Meds ordered this encounter  Medications   lisinopril (ZESTRIL) 10 MG tablet    Sig: Take 0.5 tablets (5 mg total) by mouth daily.    Dispense:  30 tablet    Refill:  4      Follow-up: No follow-ups on file.    Beckie Salts, [place] [address],  [place],  [address]

## 2020-05-08 NOTE — Assessment & Plan Note (Signed)
-   Pt is obesity I = 30-34.9 - activation or motivation to change monitored Discussed low carb diet, intermittent fasting, fu 2 weeks.

## 2020-05-20 ENCOUNTER — Other Ambulatory Visit: Payer: Self-pay

## 2020-05-20 ENCOUNTER — Ambulatory Visit (INDEPENDENT_AMBULATORY_CARE_PROVIDER_SITE_OTHER): Payer: 59 | Admitting: Internal Medicine

## 2020-05-20 ENCOUNTER — Encounter: Payer: Self-pay | Admitting: Internal Medicine

## 2020-05-20 VITALS — BP 152/91 | HR 80 | Ht 65.0 in | Wt 203.7 lb

## 2020-05-20 DIAGNOSIS — I10 Essential (primary) hypertension: Secondary | ICD-10-CM | POA: Diagnosis not present

## 2020-05-20 DIAGNOSIS — E039 Hypothyroidism, unspecified: Secondary | ICD-10-CM

## 2020-05-20 DIAGNOSIS — N390 Urinary tract infection, site not specified: Secondary | ICD-10-CM

## 2020-05-20 DIAGNOSIS — N761 Subacute and chronic vaginitis: Secondary | ICD-10-CM | POA: Insufficient documentation

## 2020-05-20 DIAGNOSIS — B952 Enterococcus as the cause of diseases classified elsewhere: Secondary | ICD-10-CM

## 2020-05-20 DIAGNOSIS — E669 Obesity, unspecified: Secondary | ICD-10-CM | POA: Diagnosis not present

## 2020-05-20 MED ORDER — CIPROFLOXACIN HCL 500 MG PO TABS: 500.0000 mg | ORAL_TABLET | Freq: Two times a day (BID) | ORAL | 0 refills | Status: DC

## 2020-05-20 MED ORDER — LISINOPRIL 10 MG PO TABS: 10.0000 mg | ORAL_TABLET | Freq: Every day | ORAL | 4 refills | Status: DC

## 2020-05-20 MED ORDER — FLUCONAZOLE 150 MG PO TABS
150.0000 mg | ORAL_TABLET | Freq: Once | ORAL | 0 refills | Status: AC
Start: 2020-05-20 — End: 2020-05-20

## 2020-05-20 MED ORDER — SULFAMETHOXAZOLE-TRIMETHOPRIM 400-80 MG PO TABS: 1.0000 | ORAL_TABLET | Freq: Two times a day (BID) | ORAL | 0 refills | Status: DC

## 2020-05-20 NOTE — Progress Notes (Signed)
Established Patient Office Visit  Subjective:  Patient ID: Ashley [name], female    DOB: [birth year]  Age: 54 y.o. MRN: [id number]  CC:  Chief Complaint  Patient presents with  . Hypertension    BP follow up    HPI  Ashley [name]   patient came in with similar UTI.  She has an episode of UTI in the past and was treated with Cipro.  She denies any history of flank pain.  Denies any history of fever or chills.  Patient has a history of Covid infection in the past she is doing well.  Her blood pressure is borderline elevated so  dose of the ACE inhibitor was increased.  Past Medical History:  Diagnosis Date  . Hypothyroidism   . Thyroid enlarged    pt reports "being watched"    Past Surgical History:  Procedure Laterality Date  . COLONOSCOPY WITH PROPOFOL N/A 12/26/2015   Procedure: COLONOSCOPY WITH PROPOFOL;  Surgeon: Lucilla Lame, MD;  Location: [place];  Service: Endoscopy;  Laterality: N/A;  . DILATATION & CURETTAGE/HYSTEROSCOPY WITH MYOSURE N/A 02/13/2018   Procedure: DILATATION & CURETTAGE/HYSTEROSCOPY WITH GENESYSN HTA THERMAL ABLATION;  Surgeon: Rubie Maid, MD;  Location: [facility];  Service: Gynecology;  Laterality: N/A;  . EXCISION MASS NECK Right 05/27/2015   Procedure: EXCISION MASS NECK RIGHT LIPOMA;  Surgeon: Clyde Canterbury, MD;  Location: [place];  Service: ENT;  Laterality: Right;  . TONSILLECTOMY    . TUBAL LIGATION      Family History  Problem Relation Age of Onset  . Hypertension Mother   . Hypertension Father   . Diabetes Father     Social History   Socioeconomic History  . Marital status: Single    Spouse name: Not on file  . Number of children: Not on file  . Years of education: Not on file  . Highest education level: Not on file  Occupational History  . Not on file  Tobacco Use  . Smoking status: Never Smoker  . Smokeless tobacco: Never Used  Vaping Use  . Vaping Use: Never used  Substance and Sexual  Activity  . Alcohol use: Yes    Comment: OCC  . Drug use: No  . Sexual activity: Yes    Birth control/protection: None, Surgical  Other Topics Concern  . Not on file  Social History Narrative  . Not on file   Social Determinants of Health   Financial Resource Strain:   . Difficulty of Paying Living Expenses: Not on file  Food Insecurity:   . Worried About Charity fundraiser in the Last Year: Not on file  . Ran Out of Food in the Last Year: Not on file  Transportation Needs:   . Lack of Transportation (Medical): Not on file  . Lack of Transportation (Non-Medical): Not on file  Physical Activity:   . Days of Exercise per Week: Not on file  . Minutes of Exercise per Session: Not on file  Stress:   . Feeling of Stress : Not on file  Social Connections:   . Frequency of Communication with Friends and Family: Not on file  . Frequency of Social Gatherings with Friends and Family: Not on file  . Attends Religious Services: Not on file  . Active Member of Clubs or Organizations: Not on file  . Attends Archivist Meetings: Not on file  . Marital Status: Not on file  Intimate Partner Violence:   . Fear of  Current or Ex-Partner: Not on file  . Emotionally Abused: Not on file  . Physically Abused: Not on file  . Sexually Abused: Not on file     Current Outpatient Medications:  .  acetaminophen (TYLENOL) 500 MG tablet, Take 500-1,000 mg by mouth daily as needed for moderate pain or headache., Disp: , Rfl:  .  Biotin 5000 MCG CAPS, Take 5,000 mcg by mouth daily., Disp: , Rfl:  .  ciprofloxacin (CIPRO) 500 MG tablet, Take 1 tablet (500 mg total) by mouth 2 (two) times daily., Disp: 10 tablet, Rfl: 0 .  ibuprofen (ADVIL) 800 MG tablet, Take 1 tablet (800 mg total) by mouth every 8 (eight) hours as needed for moderate pain., Disp: 60 tablet, Rfl: 1 .  IRON PO, Take 1 tablet by mouth daily., Disp: , Rfl:  .  levothyroxine (SYNTHROID, LEVOTHROID) 50 MCG tablet, Take 50 mcg by  mouth daily before breakfast., Disp: , Rfl:  .  lisinopril (ZESTRIL) 10 MG tablet, Take 1 tablet (10 mg total) by mouth daily., Disp: 30 tablet, Rfl: 4 .  medroxyPROGESTERone (PROVERA) 10 MG tablet, Take 1 tablet (10 mg total) by mouth daily., Disp: 10 tablet, Rfl: 0 .  Melatonin 5 MG TABS, Take 5 mg by mouth daily as needed (sleep)., Disp: , Rfl:  .  Multiple Vitamin (MULTIVITAMIN) tablet, Take 1 tablet by mouth daily., Disp: , Rfl:  .  norethindrone (AYGESTIN) 5 MG tablet, Take 1 tablet (5 mg total) by mouth daily. Take once tablet daily for 3 months., Disp: 30 tablet, Rfl: 3 .  sulfamethoxazole-trimethoprim (BACTRIM) 400-80 MG tablet, Take 1 tablet by mouth 2 (two) times daily., Disp: 10 tablet, Rfl: 0 .  VITAMIN D PO, Take by mouth., Disp: , Rfl:    No Known Allergies  ROS Review of Systems  Constitutional: Negative.   HENT: Negative.   Eyes: Negative.   Respiratory: Negative.   Cardiovascular: Negative.   Gastrointestinal: Negative.   Endocrine: Negative.   Genitourinary: Negative.   Musculoskeletal: Negative.   Skin: Negative.   Allergic/Immunologic: Negative.   Neurological: Negative.   Hematological: Negative.   Psychiatric/Behavioral: Negative.   All other systems reviewed and are negative.     Objective:    Physical Exam Vitals reviewed.  Constitutional:      Appearance: Normal appearance.  HENT:     Mouth/Throat:     Mouth: Mucous membranes are moist.  Eyes:     Pupils: Pupils are equal, round, and reactive to light.  Neck:     Vascular: No carotid bruit.  Cardiovascular:     Rate and Rhythm: Normal rate and regular rhythm.     Pulses: Normal pulses.     Heart sounds: Normal heart sounds.  Pulmonary:     Effort: Pulmonary effort is normal.     Breath sounds: Normal breath sounds.  Abdominal:     General: Bowel sounds are normal.     Palpations: Abdomen is soft. There is no hepatomegaly, splenomegaly or mass.     Tenderness: There is no abdominal  tenderness.     Hernia: No hernia is present.  Musculoskeletal:        General: No tenderness.     Cervical back: Neck supple.     Right lower leg: No edema.     Left lower leg: No edema.  Skin:    Findings: No rash.  Neurological:     Mental Status: She is alert and oriented to person, place, and time.  Motor: No weakness.  Psychiatric:        Mood and Affect: Mood and affect normal.        Behavior: Behavior normal.     BP (!) 152/91   Pulse 80   Ht 5\' 5"  (1.651 m)   Wt 203 lb 11.2 oz (92.4 kg)   BMI 33.90 kg/m  Wt Readings from Last 3 Encounters:  05/20/20 203 lb 11.2 oz (92.4 kg)  05/08/20 201 lb 11.2 oz (91.5 kg)  04/30/20 204 lb 12.8 oz (92.9 kg)     Health Maintenance Due  Topic Date Due  . Hepatitis C Screening  Never done  . TETANUS/TDAP  Never done    There are no preventive care reminders to display for this patient.  Lab Results  Component Value Date   TSH 1.350 12/30/2017   Lab Results  Component Value Date   WBC 4.6 04/30/2020   HGB 12.6 04/30/2020   HCT 37.0 04/30/2020   MCV 91 04/30/2020   PLT 284 04/30/2020   Lab Results  Component Value Date   NA 140 04/30/2020   K 4.5 04/30/2020   CO2 25 04/30/2020   GLUCOSE 89 04/30/2020   BUN 12 04/30/2020   CREATININE 0.93 04/30/2020   BILITOT <0.2 04/30/2020   ALKPHOS 87 04/30/2020   AST 18 04/30/2020   ALT 13 04/30/2020   PROT 7.4 04/30/2020   ALBUMIN 4.6 04/30/2020   CALCIUM 9.2 04/30/2020   ANIONGAP 11 08/12/2017   Lab Results  Component Value Date   CHOL 303 (H) 04/30/2020   Lab Results  Component Value Date   HDL 133 04/30/2020   Lab Results  Component Value Date   LDLCALC 162 (H) 04/30/2020   Lab Results  Component Value Date   TRIG 56 04/30/2020   Lab Results  Component Value Date   CHOLHDL 2.3 04/30/2020   Lab Results  Component Value Date   HGBA1C 5.9 (H) 04/30/2020      Assessment & Plan:   Problem List Items Addressed This Visit      Cardiovascular  and Mediastinum   Essential hypertension - Primary    - Today, the patient's blood pressure is not well managed on ace inhibitor. - The patient will change the current treatment regimen.  - I encouraged the patient to eat a low-sodium diet to help control blood pressure. - I encouraged the patient to live an active lifestyle and complete activities that increases heart rate to 85% target heart rate at least 5 times per week for one hour.          Relevant Medications   lisinopril (ZESTRIL) 10 MG tablet     Endocrine   Acquired hypothyroidism    Patient will continue the current dose of levothyroxine        Genitourinary   UTI (urinary tract infection) due to Enterococcus    Patient was given Cipro 500 mg p.o. twice a day for 5 days      Relevant Medications   ciprofloxacin (CIPRO) 500 MG tablet   sulfamethoxazole-trimethoprim (BACTRIM) 400-80 MG tablet     Other   Obesity (BMI 30.0-34.9)    - I encouraged the patient to lose weight.  - I educated them on making healthy dietary choices including eating more fruits and vegetables and less fried foods. - I encouraged the patient to exercise more, and educated on the benefits of exercise including weight loss, diabetes prevention, and hypertension   management  Meds ordered this encounter  Medications  . lisinopril (ZESTRIL) 10 MG tablet    Sig: Take 1 tablet (10 mg total) by mouth daily.    Dispense:  30 tablet    Refill:  4  . ciprofloxacin (CIPRO) 500 MG tablet    Sig: Take 1 tablet (500 mg total) by mouth 2 (two) times daily.    Dispense:  10 tablet    Refill:  0  . fluconazole (DIFLUCAN) 150 MG tablet    Sig: Take 1 tablet (150 mg total) by mouth once for 1 dose.    Dispense:  1 tablet    Refill:  0  . sulfamethoxazole-trimethoprim (BACTRIM) 400-80 MG tablet    Sig: Take 1 tablet by mouth 2 (two) times daily.    Dispense:  10 tablet    Refill:  0    Follow-up: No follow-ups on file.    Cletis Athens, MD

## 2020-05-25 NOTE — Assessment & Plan Note (Signed)
Patient will continue the current dose of levothyroxine

## 2020-05-25 NOTE — Assessment & Plan Note (Signed)
-   Today, the patient's blood pressure is not well managed on ace inhibitor. - The patient will change the current treatment regimen.  - I encouraged the patient to eat a low-sodium diet to help control blood pressure. - I encouraged the patient to live an active lifestyle and complete activities that increases heart rate to 85% target heart rate at least 5 times per week for one hour.

## 2020-05-25 NOTE — Assessment & Plan Note (Signed)
Patient was given Cipro 500 mg p.o. twice a day for 5 days

## 2020-05-25 NOTE — Assessment & Plan Note (Signed)
-   I encouraged the patient to lose weight.  - I educated them on making healthy dietary choices including eating more fruits and vegetables and less fried foods. - I encouraged the patient to exercise more, and educated on the benefits of exercise including weight loss, diabetes prevention, and hypertension management.   

## 2020-06-06 ENCOUNTER — Other Ambulatory Visit: Payer: Self-pay

## 2020-06-06 ENCOUNTER — Ambulatory Visit
Admission: RE | Admit: 2020-06-06 | Discharge: 2020-06-06 | Disposition: A | Discharge status: Home / Self Care | Payer: 59 | Source: Ambulatory Visit | Attending: Obstetrics and Gynecology | Admitting: Obstetrics and Gynecology

## 2020-06-06 DIAGNOSIS — Z1231 Encounter for screening mammogram for malignant neoplasm of breast: Secondary | ICD-10-CM | POA: Insufficient documentation

## 2020-06-17 ENCOUNTER — Other Ambulatory Visit: Payer: Self-pay

## 2020-06-17 ENCOUNTER — Encounter: Payer: Self-pay | Admitting: Internal Medicine

## 2020-06-17 ENCOUNTER — Ambulatory Visit (INDEPENDENT_AMBULATORY_CARE_PROVIDER_SITE_OTHER): Payer: 59 | Admitting: Internal Medicine

## 2020-06-17 VITALS — BP 160/97 | HR 76 | Ht 65.0 in | Wt 203.7 lb

## 2020-06-17 DIAGNOSIS — I10 Essential (primary) hypertension: Secondary | ICD-10-CM

## 2020-06-17 DIAGNOSIS — E7800 Pure hypercholesterolemia, unspecified: Secondary | ICD-10-CM

## 2020-06-17 DIAGNOSIS — J301 Allergic rhinitis due to pollen: Secondary | ICD-10-CM | POA: Insufficient documentation

## 2020-06-17 DIAGNOSIS — E063 Autoimmune thyroiditis: Secondary | ICD-10-CM

## 2020-06-17 DIAGNOSIS — E669 Obesity, unspecified: Secondary | ICD-10-CM

## 2020-06-17 DIAGNOSIS — E038 Other specified hypothyroidism: Secondary | ICD-10-CM

## 2020-06-17 MED ORDER — ROSUVASTATIN CALCIUM 20 MG PO TABS: 20.0000 mg | ORAL_TABLET | Freq: Every day | ORAL | 3 refills | Status: DC

## 2020-06-17 NOTE — Assessment & Plan Note (Signed)
I instructed the patient on low-cholesterol diet and started a low-cholesterol 20 mg p.o. daily.  Also advised to lose weight and walking on a daily basis

## 2020-06-17 NOTE — Progress Notes (Signed)
Established Patient Office Visit  Subjective:  Patient ID: Ashley [name], female    DOB: 06/10/1966  Age: 54 y.o. MRN: [id number]  CC:  Chief Complaint  Patient presents with  . mucus    Patient states that she has a lot of mucus that causes her to cough and then she gets nauseated and gags from the coughing. She states that she recently saw her ENT, but didn't get anything for the cough.     URI  This is a recurrent problem. The current episode started more than 1 month ago. The problem has been waxing and waning. Associated symptoms include congestion. Pertinent negatives include no abdominal pain, chest pain, coughing, dysuria, headaches, joint pain, nausea, neck pain, sinus pain, sneezing, sore throat, swollen glands or wheezing. She has tried decongestant for the symptoms. The treatment provided mild relief.     Ashley [name] presents for obesity , chest congestion ,   Past Medical History:  Diagnosis Date  . Hypothyroidism   . Thyroid enlarged    pt reports "being watched"    Past Surgical History:  Procedure Laterality Date  . COLONOSCOPY WITH PROPOFOL N/A 12/26/2015   Procedure: COLONOSCOPY WITH PROPOFOL;  Surgeon: Lucilla Lame, MD;  Location: [place];  Service: Endoscopy;  Laterality: N/A;  . DILATATION & CURETTAGE/HYSTEROSCOPY WITH MYOSURE N/A 02/13/2018   Procedure: DILATATION & CURETTAGE/HYSTEROSCOPY WITH GENESYSN HTA THERMAL ABLATION;  Surgeon: Rubie Maid, MD;  Location: [facility];  Service: Gynecology;  Laterality: N/A;  . EXCISION MASS NECK Right 05/27/2015   Procedure: EXCISION MASS NECK RIGHT LIPOMA;  Surgeon: Clyde Canterbury, MD;  Location: [place];  Service: ENT;  Laterality: Right;  . TONSILLECTOMY    . TUBAL LIGATION      Family History  Problem Relation Age of Onset  . Hypertension Mother   . Hypertension Father   . Diabetes Father     Social History   Socioeconomic History  . Marital status: Single    Spouse  name: Not on file  . Number of children: Not on file  . Years of education: Not on file  . Highest education level: Not on file  Occupational History  . Not on file  Tobacco Use  . Smoking status: Never Smoker  . Smokeless tobacco: Never Used  Vaping Use  . Vaping Use: Never used  Substance and Sexual Activity  . Alcohol use: Yes    Comment: OCC  . Drug use: No  . Sexual activity: Yes    Birth control/protection: None, Surgical  Other Topics Concern  . Not on file  Social History Narrative  . Not on file   Social Determinants of Health   Financial Resource Strain: Not on file  Food Insecurity: Not on file  Transportation Needs: Not on file  Physical Activity: Not on file  Stress: Not on file  Social Connections: Not on file  Intimate Partner Violence: Not on file     Current Outpatient Medications:  .  acetaminophen (TYLENOL) 500 MG tablet, Take 500-1,000 mg by mouth daily as needed for moderate pain or headache., Disp: , Rfl:  .  Biotin 5000 MCG CAPS, Take 5,000 mcg by mouth daily., Disp: , Rfl:  .  ciprofloxacin (CIPRO) 500 MG tablet, Take 1 tablet (500 mg total) by mouth 2 (two) times daily., Disp: 10 tablet, Rfl: 0 .  ibuprofen (ADVIL) 800 MG tablet, Take 1 tablet (800 mg total) by mouth every 8 (eight) hours as needed for moderate  pain., Disp: 60 tablet, Rfl: 1 .  IRON PO, Take 1 tablet by mouth daily., Disp: , Rfl:  .  levothyroxine (SYNTHROID, LEVOTHROID) 50 MCG tablet, Take 50 mcg by mouth daily before breakfast., Disp: , Rfl:  .  lisinopril (ZESTRIL) 10 MG tablet, Take 1 tablet (10 mg total) by mouth daily., Disp: 30 tablet, Rfl: 4 .  medroxyPROGESTERone (PROVERA) 10 MG tablet, Take 1 tablet (10 mg total) by mouth daily., Disp: 10 tablet, Rfl: 0 .  Melatonin 5 MG TABS, Take 5 mg by mouth daily as needed (sleep)., Disp: , Rfl:  .  Multiple Vitamin (MULTIVITAMIN) tablet, Take 1 tablet by mouth daily., Disp: , Rfl:  .  norethindrone (AYGESTIN) 5 MG tablet, Take 1  tablet (5 mg total) by mouth daily. Take once tablet daily for 3 months., Disp: 30 tablet, Rfl: 3 .  VITAMIN D PO, Take by mouth., Disp: , Rfl:  .  rosuvastatin (CRESTOR) 20 MG tablet, Take 1 tablet (20 mg total) by mouth daily., Disp: 90 tablet, Rfl: 3   No Known Allergies  ROS Review of Systems  Constitutional: Negative.   HENT: Positive for congestion. Negative for sinus pain, sneezing and sore throat.   Eyes: Negative.   Respiratory: Negative.  Negative for cough and wheezing.   Cardiovascular: Negative.  Negative for chest pain.  Gastrointestinal: Negative.  Negative for abdominal pain and nausea.  Endocrine: Negative.   Genitourinary: Negative.  Negative for dysuria.  Musculoskeletal: Negative.  Negative for joint pain and neck pain.  Skin: Negative.   Allergic/Immunologic: Negative.   Neurological: Negative.  Negative for headaches.  Hematological: Negative.   Psychiatric/Behavioral: Negative.   All other systems reviewed and are negative.     Objective:    Physical Exam Vitals reviewed.  Constitutional:      Appearance: Normal appearance.  HENT:     Mouth/Throat:     Mouth: Mucous membranes are moist.  Eyes:     Pupils: Pupils are equal, round, and reactive to light.  Neck:     Vascular: No carotid bruit.  Cardiovascular:     Rate and Rhythm: Normal rate and regular rhythm.     Pulses: Normal pulses.     Heart sounds: Normal heart sounds.  Pulmonary:     Effort: Pulmonary effort is normal.     Breath sounds: Normal breath sounds.  Abdominal:     General: Bowel sounds are normal.     Palpations: Abdomen is soft. There is no hepatomegaly, splenomegaly or mass.     Tenderness: There is no abdominal tenderness.     Hernia: No hernia is present.  Musculoskeletal:        General: No tenderness.     Cervical back: Neck supple.     Right lower leg: No edema.     Left lower leg: No edema.  Skin:    Findings: No rash.  Neurological:     Mental Status: She is  alert and oriented to person, place, and time.     Motor: No weakness.  Psychiatric:        Mood and Affect: Mood and affect normal.        Behavior: Behavior normal.     BP (!) 160/97   Pulse 76   Ht 5\' 5"  (1.651 m)   Wt 203 lb 11.2 oz (92.4 kg)   BMI 33.90 kg/m  Wt Readings from Last 3 Encounters:  06/17/20 203 lb 11.2 oz (92.4 kg)  05/20/20 203 lb 11.2  oz (92.4 kg)  05/08/20 201 lb 11.2 oz (91.5 kg)     Health Maintenance Due  Topic Date Due  . Hepatitis C Screening  Never done  . TETANUS/TDAP  Never done  . COVID-19 Vaccine (3 - Booster for Moderna series) 04/15/2020    There are no preventive care reminders to display for this patient.  Lab Results  Component Value Date   TSH 1.350 12/30/2017   Lab Results  Component Value Date   WBC 4.6 04/30/2020   HGB 12.6 04/30/2020   HCT 37.0 04/30/2020   MCV 91 04/30/2020   PLT 284 04/30/2020   Lab Results  Component Value Date   NA 140 04/30/2020   K 4.5 04/30/2020   CO2 25 04/30/2020   GLUCOSE 89 04/30/2020   BUN 12 04/30/2020   CREATININE 0.93 04/30/2020   BILITOT <0.2 04/30/2020   ALKPHOS 87 04/30/2020   AST 18 04/30/2020   ALT 13 04/30/2020   PROT 7.4 04/30/2020   ALBUMIN 4.6 04/30/2020   CALCIUM 9.2 04/30/2020   ANIONGAP 11 08/12/2017   Lab Results  Component Value Date   CHOL 303 (H) 04/30/2020   Lab Results  Component Value Date   HDL 133 04/30/2020   Lab Results  Component Value Date   LDLCALC 162 (H) 04/30/2020   Lab Results  Component Value Date   TRIG 56 04/30/2020   Lab Results  Component Value Date   CHOLHDL 2.3 04/30/2020   Lab Results  Component Value Date   HGBA1C 5.9 (H) 04/30/2020      Assessment & Plan:   Problem List Items Addressed This Visit      Cardiovascular and Mediastinum   Essential hypertension    - Today, the patient's blood pressure is not well managed  - The patient will continue the current treatment regimen.  - I encouraged the patient to eat a  low-sodium diet to help control blood pressure. - I encouraged the patient to live an active lifestyle and complete activities that increases heart rate to 85% target heart rate at least 5 times per week for one hour.          Relevant Medications   rosuvastatin (CRESTOR) 20 MG tablet     Respiratory   Seasonal allergic rhinitis due to pollen - Primary     Endocrine   Hypothyroidism due to Hashimoto's thyroiditis    Patient is taking Synthroid 50 mcg p.o. daily        Other   Obesity (BMI 30.0-34.9)    - I encouraged the patient to lose weight.  - I educated them on making healthy dietary choices including eating more fruits and vegetables and less fried foods. - I encouraged the patient to exercise more, and educated on the benefits of exercise including weight loss, diabetes prevention, and hypertension prevention.        Pure hypercholesterolemia    I instructed the patient on low-cholesterol diet and started a low-cholesterol 20 mg p.o. daily.  Also advised to lose weight and walking on a daily basis      Relevant Medications   rosuvastatin (CRESTOR) 20 MG tablet      Meds ordered this encounter  Medications  . rosuvastatin (CRESTOR) 20 MG tablet    Sig: Take 1 tablet (20 mg total) by mouth daily.    Dispense:  90 tablet    Refill:  3    Follow-up: No follow-ups on file.    Cletis Athens, MD

## 2020-06-17 NOTE — Assessment & Plan Note (Signed)
-   I encouraged the patient to lose weight.  - I educated them on making healthy dietary choices including eating more fruits and vegetables and less fried foods. - I encouraged the patient to exercise more, and educated on the benefits of exercise including weight loss, diabetes prevention, and hypertension prevention.   

## 2020-06-17 NOTE — Assessment & Plan Note (Signed)
Patient is taking Synthroid 50 mcg p.o. daily

## 2020-06-17 NOTE — Assessment & Plan Note (Signed)
-   Today, the patient's blood pressure is not well managed  - The patient will continue the current treatment regimen.  - I encouraged the patient to eat a low-sodium diet to help control blood pressure. - I encouraged the patient to live an active lifestyle and complete activities that increases heart rate to 85% target heart rate at least 5 times per week for one hour.

## 2020-08-15 ENCOUNTER — Other Ambulatory Visit: Payer: Self-pay | Admitting: *Deleted

## 2020-08-15 DIAGNOSIS — E7800 Pure hypercholesterolemia, unspecified: Secondary | ICD-10-CM

## 2020-08-15 MED ORDER — ROSUVASTATIN CALCIUM 20 MG PO TABS: 20.0000 mg | ORAL_TABLET | Freq: Every day | ORAL | 3 refills | Status: DC

## 2020-08-15 MED ORDER — LISINOPRIL 10 MG PO TABS
10.0000 mg | ORAL_TABLET | Freq: Every day | ORAL | 3 refills | Status: DC
Start: 2020-08-15 — End: 2020-12-17

## 2020-09-16 ENCOUNTER — Other Ambulatory Visit: Payer: Self-pay

## 2020-09-16 ENCOUNTER — Ambulatory Visit (INDEPENDENT_AMBULATORY_CARE_PROVIDER_SITE_OTHER): Payer: 59 | Admitting: Internal Medicine

## 2020-09-16 ENCOUNTER — Encounter: Payer: Self-pay | Admitting: Internal Medicine

## 2020-09-16 VITALS — BP 130/86 | HR 82 | Ht 65.0 in | Wt 203.1 lb

## 2020-09-16 DIAGNOSIS — G43829 Menstrual migraine, not intractable, without status migrainosus: Secondary | ICD-10-CM | POA: Diagnosis not present

## 2020-09-16 DIAGNOSIS — N951 Menopausal and female climacteric states: Secondary | ICD-10-CM

## 2020-09-16 DIAGNOSIS — E063 Autoimmune thyroiditis: Secondary | ICD-10-CM

## 2020-09-16 DIAGNOSIS — I10 Essential (primary) hypertension: Secondary | ICD-10-CM

## 2020-09-16 DIAGNOSIS — J301 Allergic rhinitis due to pollen: Secondary | ICD-10-CM

## 2020-09-16 DIAGNOSIS — E669 Obesity, unspecified: Secondary | ICD-10-CM

## 2020-09-16 DIAGNOSIS — E038 Other specified hypothyroidism: Secondary | ICD-10-CM

## 2020-09-16 MED ORDER — IBUPROFEN 800 MG PO TABS
800.0000 mg | ORAL_TABLET | Freq: Three times a day (TID) | ORAL | 1 refills | Status: DC | PRN
Start: 2020-09-16 — End: 2020-09-16

## 2020-09-16 NOTE — Assessment & Plan Note (Signed)
TSH is normal

## 2020-09-16 NOTE — Progress Notes (Signed)
Established Patient Office Visit  Subjective:  Patient ID: Ashley [name], female    DOB: May 06, 1966  Age: 55 y.o. MRN: [id number]  CC:  Chief Complaint  Patient presents with  . Hypertension    HPI  Ashley [name] presents for check up/ c/o sinus  pain , and gerd  Past Medical History:  Diagnosis Date  . Hypothyroidism   . Thyroid enlarged    pt reports "being watched"    Past Surgical History:  Procedure Laterality Date  . COLONOSCOPY WITH PROPOFOL N/A 12/26/2015   Procedure: COLONOSCOPY WITH PROPOFOL;  Surgeon: Lucilla Lame, MD;  Location: [facility];  Service: Endoscopy;  Laterality: N/A;  . DILATATION & CURETTAGE/HYSTEROSCOPY WITH MYOSURE N/A 02/13/2018   Procedure: DILATATION & CURETTAGE/HYSTEROSCOPY WITH GENESYSN HTA THERMAL ABLATION;  Surgeon: Rubie Maid, MD;  Location: [facility];  Service: Gynecology;  Laterality: N/A;  . EXCISION MASS NECK Right 05/27/2015   Procedure: EXCISION MASS NECK RIGHT LIPOMA;  Surgeon: Clyde Canterbury, MD;  Location: [facility];  Service: ENT;  Laterality: Right;  . TONSILLECTOMY    . TUBAL LIGATION      Family History  Problem Relation Age of Onset  . Hypertension Mother   . Hypertension Father   . Diabetes Father     Social History   Socioeconomic History  . Marital status: Single    Spouse name: Not on file  . Number of children: Not on file  . Years of education: Not on file  . Highest education level: Not on file  Occupational History  . Not on file  Tobacco Use  . Smoking status: Never Smoker  . Smokeless tobacco: Never Used  Vaping Use  . Vaping Use: Never used  Substance and Sexual Activity  . Alcohol use: Yes    Comment: OCC  . Drug use: No  . Sexual activity: Yes    Birth control/protection: None, Surgical  Other Topics Concern  . Not on file  Social History Narrative  . Not on file   Social Determinants of Health   Financial Resource Strain: Not on file  Food Insecurity: Not  on file  Transportation Needs: Not on file  Physical Activity: Not on file  Stress: Not on file  Social Connections: Not on file  Intimate Partner Violence: Not on file     Current Outpatient Medications:  .  acetaminophen (TYLENOL) 500 MG tablet, Take 500-1,000 mg by mouth daily as needed for moderate pain or headache., Disp: , Rfl:  .  ciprofloxacin (CIPRO) 500 MG tablet, Take 1 tablet (500 mg total) by mouth 2 (two) times daily., Disp: 10 tablet, Rfl: 0 .  IRON PO, Take 1 tablet by mouth daily., Disp: , Rfl:  .  levothyroxine (SYNTHROID, LEVOTHROID) 50 MCG tablet, Take 50 mcg by mouth daily before breakfast., Disp: , Rfl:  .  lisinopril (ZESTRIL) 10 MG tablet, Take 1 tablet (10 mg total) by mouth daily., Disp: 90 tablet, Rfl: 3 .  Melatonin 5 MG TABS, Take 5 mg by mouth daily as needed (sleep)., Disp: , Rfl:  .  Multiple Vitamin (MULTIVITAMIN) tablet, Take 1 tablet by mouth daily., Disp: , Rfl:  .  rosuvastatin (CRESTOR) 20 MG tablet, Take 1 tablet (20 mg total) by mouth daily., Disp: 90 tablet, Rfl: 3 .  VITAMIN D PO, Take by mouth., Disp: , Rfl:  .  ibuprofen (ADVIL) 800 MG tablet, Take 1 tablet (800 mg total) by mouth every 8 (eight) hours as needed for  moderate pain., Disp: 60 tablet, Rfl: 1   No Known Allergies  ROS Review of Systems  Constitutional: Negative for appetite change.  HENT: Positive for congestion. Negative for nosebleeds.   Eyes: Negative for itching.  Cardiovascular: Negative for chest pain.  Gastrointestinal: Negative for abdominal distention.  Musculoskeletal: Negative for arthralgias.  Neurological: Negative for dizziness and syncope.  Psychiatric/Behavioral: Negative for dysphoric mood.      Objective:    Physical Exam HENT:     Nose: Nose normal.  Eyes:     Extraocular Movements: Extraocular movements intact.  Cardiovascular:     Heart sounds: No murmur heard.   Musculoskeletal:     Cervical back: No rigidity.  Neurological:     General:  No focal deficit present.     Mental Status: She is alert.  Psychiatric:        Mood and Affect: Mood normal.     BP 130/86   Pulse 82   Ht 5\' 5"  (1.651 m)   Wt 203 lb 1.6 oz (92.1 kg)   BMI 33.80 kg/m  Wt Readings from Last 3 Encounters:  09/16/20 203 lb 1.6 oz (92.1 kg)  06/17/20 203 lb 11.2 oz (92.4 kg)  05/20/20 203 lb 11.2 oz (92.4 kg)     Health Maintenance Due  Topic Date Due  . Hepatitis C Screening  Never done  . TETANUS/TDAP  Never done  . COVID-19 Vaccine (3 - Booster for Moderna series) 04/15/2020    There are no preventive care reminders to display for this patient.  Lab Results  Component Value Date   TSH 1.350 12/30/2017   Lab Results  Component Value Date   WBC 4.6 04/30/2020   HGB 12.6 04/30/2020   HCT 37.0 04/30/2020   MCV 91 04/30/2020   PLT 284 04/30/2020   Lab Results  Component Value Date   NA 140 04/30/2020   K 4.5 04/30/2020   CO2 25 04/30/2020   GLUCOSE 89 04/30/2020   BUN 12 04/30/2020   CREATININE 0.93 04/30/2020   BILITOT <0.2 04/30/2020   ALKPHOS 87 04/30/2020   AST 18 04/30/2020   ALT 13 04/30/2020   PROT 7.4 04/30/2020   ALBUMIN 4.6 04/30/2020   CALCIUM 9.2 04/30/2020   ANIONGAP 11 08/12/2017   Lab Results  Component Value Date   CHOL 303 (H) 04/30/2020   Lab Results  Component Value Date   HDL 133 04/30/2020   Lab Results  Component Value Date   LDLCALC 162 (H) 04/30/2020   Lab Results  Component Value Date   TRIG 56 04/30/2020   Lab Results  Component Value Date   CHOLHDL 2.3 04/30/2020   Lab Results  Component Value Date   HGBA1C 5.9 (H) 04/30/2020      Assessment & Plan:   Problem List Items Addressed This Visit      Cardiovascular and Mediastinum   Menstrual migraine without status migrainosus, not intractable    Patient was advised to take Claritin 5 mg p.o. daily for the sinus problem      Relevant Medications   ibuprofen (ADVIL) 800 MG tablet   Essential hypertension - Primary     Patient blood pressure is normal patient denies any chest pain or shortness of breath there is no history of palpitation paroxysmal nocturnal dyspnea patient can walkioo yards without any problem patient was advised to follow low-salt low-cholesterol diet    No chest pain            Respiratory  Seasonal allergic rhinitis due to pollen    Patient was advised to take Claritin 10 mg p.o. daily as needed        Endocrine   Hypothyroidism due to Hashimoto's thyroiditis    TSH is normal.          Other   Obesity (BMI 30.0-34.9)    Patient was started on Mediterranean diet.      Menopausal vasomotor syndrome    Refer to the Dr. Marcelline Mates       Visit November medicine HTN Patient is advised to take Tylenol 500 mg 2 tablets twice a day as needed for migraine headache   Follow-up: No follow-ups on file.  Restriction  Cletis Athens, MD

## 2020-09-16 NOTE — Assessment & Plan Note (Signed)
Refer to the Dr. Marcelline Mates

## 2020-09-16 NOTE — Assessment & Plan Note (Signed)
Patient blood pressure is normal patient denies any chest pain or shortness of breath there is no history of palpitation paroxysmal nocturnal dyspnea patient can walkioo yards without any problem patient was advised to follow low-salt low-cholesterol diet    No chest pain

## 2020-09-16 NOTE — Assessment & Plan Note (Addendum)
Patient was advised to take Claritin 10 mg p.o. daily for the sinus problem

## 2020-09-16 NOTE — Assessment & Plan Note (Signed)
Patient was started on Mediterranean diet.

## 2020-09-16 NOTE — Patient Instructions (Signed)
Mediterranean Diet A Mediterranean diet refers to food and lifestyle choices that are based on the traditions of countries located on the [place]. This way of eating has been shown to help prevent certain conditions and improve outcomes for people who have chronic diseases, like kidney disease and heart disease. What are tips for following this plan? Lifestyle  Cook and eat meals together with your family, when possible.  Drink enough fluid to keep your urine clear or pale yellow.  Be physically active every day. This includes: ? Aerobic exercise like running or swimming. ? Leisure activities like gardening, walking, or housework.  Get 7-8 hours of sleep each night.  If recommended by your health care provider, drink red wine in moderation. This means 1 glass a day for nonpregnant women and 2 glasses a day for men. A glass of wine equals 5 oz (150 mL). Reading food labels  Check the serving size of packaged foods. For foods such as rice and pasta, the serving size refers to the amount of cooked product, not dry.  Check the total fat in packaged foods. Avoid foods that have saturated fat or trans fats.  Check the ingredients list for added sugars, such as corn syrup.   Shopping  At the grocery store, buy most of your food from the areas near the walls of the store. This includes: ? Fresh fruits and vegetables (produce). ? Grains, beans, nuts, and seeds. Some of these may be available in unpackaged forms or large amounts (in bulk). ? Fresh seafood. ? Poultry and eggs. ? Low-fat dairy products.  Buy whole ingredients instead of prepackaged foods.  Buy fresh fruits and vegetables in-season from local farmers markets.  Buy frozen fruits and vegetables in resealable bags.  If you do not have access to quality fresh seafood, buy precooked frozen shrimp or canned fish, such as tuna, salmon, or sardines.  Buy small amounts of raw or cooked vegetables, salads, or olives from  the deli or salad bar at your store.  Stock your pantry so you always have certain foods on hand, such as olive oil, canned tuna, canned tomatoes, rice, pasta, and beans. Cooking  Cook foods with extra-virgin olive oil instead of using butter or other vegetable oils.  Have meat as a side dish, and have vegetables or grains as your main dish. This means having meat in small portions or adding small amounts of meat to foods like pasta or stew.  Use beans or vegetables instead of meat in common dishes like chili or lasagna.  Experiment with different cooking methods. Try roasting or broiling vegetables instead of steaming or sauteing them.  Add frozen vegetables to soups, stews, pasta, or rice.  Add nuts or seeds for added healthy fat at each meal. You can add these to yogurt, salads, or vegetable dishes.  Marinate fish or vegetables using olive oil, lemon juice, garlic, and fresh herbs. Meal planning  Plan to eat 1 vegetarian meal one day each week. Try to work up to 2 vegetarian meals, if possible.  Eat seafood 2 or more times a week.  Have healthy snacks readily available, such as: ? Vegetable sticks with hummus. ? Greek yogurt. ? Fruit and nut trail mix.  Eat balanced meals throughout the week. This includes: ? Fruit: 2-3 servings a day ? Vegetables: 4-5 servings a day ? Low-fat dairy: 2 servings a day ? Fish, poultry, or lean meat: 1 serving a day ? Beans and legumes: 2 or more servings a week ?   Nuts and seeds: 1-2 servings a day ? Whole grains: 6-8 servings a day ? Extra-virgin olive oil: 3-4 servings a day  Limit red meat and sweets to only a few servings a month   What are my food choices?  Mediterranean diet ? Recommended  Grains: Whole-grain pasta. Brown rice. Bulgar wheat. Polenta. Couscous. Whole-wheat bread. Oatmeal. Quinoa.  Vegetables: Artichokes. Beets. Broccoli. Cabbage. Carrots. Eggplant. Green beans. Chard. Kale. Spinach. Onions. Leeks. Peas. Squash.  Tomatoes. Peppers. Radishes.  Fruits: Apples. Apricots. Avocado. Berries. Bananas. Cherries. Dates. Figs. Grapes. Lemons. Melon. Oranges. Peaches. Plums. Pomegranate.  Meats and other protein foods: Beans. Almonds. Sunflower seeds. Pine nuts. Peanuts. Cod. Salmon. Scallops. Shrimp. Tuna. Tilapia. Clams. Oysters. Eggs.  Dairy: Low-fat milk. Cheese. Greek yogurt.  Beverages: Water. Red wine. Herbal tea.  Fats and oils: Extra virgin olive oil. Avocado oil. Grape seed oil.  Sweets and desserts: Greek yogurt with honey. Baked apples. Poached pears. Trail mix.  Seasoning and other foods: Basil. Cilantro. Coriander. Cumin. Mint. Parsley. Sage. Rosemary. Tarragon. Garlic. Oregano. Thyme. Pepper. Balsalmic vinegar. Tahini. Hummus. Tomato sauce. Olives. Mushrooms. ? Limit these  Grains: Prepackaged pasta or rice dishes. Prepackaged cereal with added sugar.  Vegetables: Deep fried potatoes (french fries).  Fruits: Fruit canned in syrup.  Meats and other protein foods: Beef. Pork. Lamb. Poultry with skin. Hot dogs. Bacon.  Dairy: Ice cream. Sour cream. Whole milk.  Beverages: Juice. Sugar-sweetened soft drinks. Beer. Liquor and spirits.  Fats and oils: Butter. Canola oil. Vegetable oil. Beef fat (tallow). Lard.  Sweets and desserts: Cookies. Cakes. Pies. Candy.  Seasoning and other foods: Mayonnaise. Premade sauces and marinades. The items listed may not be a complete list. Talk with your dietitian about what dietary choices are right for you. Summary  The Mediterranean diet includes both food and lifestyle choices.  Eat a variety of fresh fruits and vegetables, beans, nuts, seeds, and whole grains.  Limit the amount of red meat and sweets that you eat.  Talk with your health care provider about whether it is safe for you to drink red wine in moderation. This means 1 glass a day for nonpregnant women and 2 glasses a day for men. A glass of wine equals 5 oz (150 mL). This information  is not intended to replace advice given to you by your health care provider. Make sure you discuss any questions you have with your health care provider. Document Revised: 02/12/2016 Document Reviewed: 02/05/2016 Elsevier Patient Education  2020 [employer]  

## 2020-09-16 NOTE — Assessment & Plan Note (Signed)
Patient was advised to take Claritin 10 mg p.o. daily as needed

## 2020-09-25 ENCOUNTER — Ambulatory Visit (INDEPENDENT_AMBULATORY_CARE_PROVIDER_SITE_OTHER): Payer: 59 | Admitting: Obstetrics and Gynecology

## 2020-09-25 ENCOUNTER — Encounter: Payer: Self-pay | Admitting: Obstetrics and Gynecology

## 2020-09-25 ENCOUNTER — Other Ambulatory Visit: Payer: Self-pay

## 2020-09-25 VITALS — BP 151/78 | HR 92 | Ht 65.0 in | Wt 202.8 lb

## 2020-09-25 DIAGNOSIS — R102 Pelvic and perineal pain: Secondary | ICD-10-CM

## 2020-09-25 DIAGNOSIS — Z86018 Personal history of other benign neoplasm: Secondary | ICD-10-CM | POA: Diagnosis not present

## 2020-09-25 DIAGNOSIS — N951 Menopausal and female climacteric states: Secondary | ICD-10-CM | POA: Diagnosis not present

## 2020-09-25 MED ORDER — CLIMARA PRO 0.045-0.015 MG/DAY TD PTWK: 1.0000 | MEDICATED_PATCH | TRANSDERMAL | 3 refills | Status: DC

## 2020-09-25 NOTE — Progress Notes (Signed)
    GYNECOLOGY PROGRESS NOTE  Subjective:    Patient ID: Ashley [name], female    DOB: 10-Aug-1965, 55 y.o.   MRN: [id number]  HPI  Patient is a 55 y.o. G30P1001 female who presents for complaints of "ovarian" pain x 2 months. Reports achy, cramp-like pain in her sides.  Pain is intermittent.  Of note, patient reports that she stopped having cycles ~ 6 months ago.  She has a previous history of fibroid uterus.  Also notes that she has had a remote h/o ovarian cysts in the past.   Ashley [name] also complains of significant hot flushes. Has been going on for several months.   The following portions of the patient's history were reviewed and updated as appropriate: allergies, current medications, past family history, past medical history, past social history, past surgical history and problem list.  Review of Systems Pertinent items noted in HPI and remainder of comprehensive ROS otherwise negative.   Objective:   Blood pressure (!) 151/78, pulse 92, height 5\' 5"  (1.651 m), weight 202 lb 12.8 oz (92 kg). General appearance: alert and no distress Abdomen: soft, non-tender; bowel sounds normal; no masses,  no organomegaly Pelvic: external genitalia normal, rectovaginal septum normal.  Vagina without discharge.  Cervix normal appearing, no lesions and no motion tenderness.  Uterus mobile, nontender, normal shape and size.  Adnexae non-palpable, nontender bilaterally.  Extremities: extremities normal, atraumatic, no cyanosis or edema Neurologic: Grossly normal   Assessment:   Pelvic pain History of uterine fibroids Perimenopausal vasomotor syndrome  Plan:   1. Unclear cause of pelvic ("ovarian") pain.  Will order pelvic ultrasound to reassess fibroid uterus and ovaries.  2. Perimenopausal vasomotor syndrome - Patient with bothersome perimenopausal vasomotor symptoms. Discussed lifestyle interventions such as wearing light clothing, remaining in cool environments, having fan/air conditioner  in the room, avoiding hot beverages etc.  Discussed using hormone therapy and concerns about increased risk of heart disease, cerebrovascular disease, thromboembolic disease,  and breast cancer.  Also discussed other medical options such as Paxil, Effexor, Brisdelle, Clonidine,  or Neurontin.   Also discussed alternative therapies such as herbal remedies but cautioned that most of the products contained phytoestrogens (plant estrogens) in unregulated amounts which can have the same effects on the body as the pharmaceutical estrogen preparations.  Also referred her to www.[email or web address].org for other alternative options.  Patient opted for combined estrogen/progestin hormone therapy. for now, wants to try the transdermal formulation. Climara Pro patches prescribed, t She will return in 2 months for reevaluation.     Rubie Maid, MD [facility]

## 2020-09-25 NOTE — Progress Notes (Signed)
GYN-Pt c/o ovary pain. Pt stated that her ovaries hurting x 2 months.

## 2020-09-25 NOTE — Patient Instructions (Addendum)
https://www.womenshealth.gov/menopause/menopause-basics"> https://www.clinicalkey.com">  Menopause Menopause is the normal time of a woman's life when menstrual periods stop completely. It marks the natural end to a woman's ability to become pregnant. It can be defined as the absence of a menstrual period for 12 months without another medical cause. The transition to menopause (perimenopause) most often happens between the ages of 45 and 55, and can last for many years. During perimenopause, hormone levels change in your body, which can cause symptoms and affect your health. Menopause may increase your risk for:  Weakened bones (osteoporosis), which causes fractures.  Depression.  Hardening and narrowing of the arteries (atherosclerosis), which can cause heart attacks and strokes. What are the causes? This condition is usually caused by a natural change in hormone levels that happens as you get older. The condition may also be caused by changes that are not natural, including:  Surgery to remove both ovaries (surgical menopause).  Side effects from some medicines, such as chemotherapy used to treat cancer (chemical menopause). What increases the risk? This condition is more likely to start at an earlier age if you have certain medical conditions or have undergone treatments, including:  A tumor of the pituitary gland in the brain.  A disease that affects the ovaries and hormones.  Certain cancer treatments, such as chemotherapy or hormone therapy, or radiation therapy on the pelvis.  Heavy smoking and excessive alcohol use.  Family history of early menopause. This condition is also more likely to develop earlier in women who are very thin. What are the signs or symptoms? Symptoms of this condition include:  Hot flashes.  Irregular menstrual periods.  Night sweats.  Changes in feelings about sex. This could be a decrease in sex drive or an increased discomfort around your  sexuality.  Vaginal dryness and thinning of the vaginal walls. This may cause painful sex.  Dryness of the skin and development of wrinkles.  Headaches.  Problems sleeping (insomnia).  Mood swings or irritability.  Memory problems.  Weight gain.  Hair growth on the face and chest.  Bladder infections or problems with urinating. How is this diagnosed? This condition is diagnosed based on your medical history, a physical exam, your age, your menstrual history, and your symptoms. Hormone tests may also be done. How is this treated? In some cases, no treatment is needed. You and your health care provider should make a decision together about whether treatment is necessary. Treatment will be based on your individual condition and preferences. Treatment for this condition focuses on managing symptoms. Treatment may include:  Menopausal hormone therapy (MHT).  Medicines to treat specific symptoms or complications.  Acupuncture.  Vitamin or herbal supplements. Before starting treatment, make sure to let your health care provider know if you have a personal or family history of these conditions:  Heart disease.  Breast cancer.  Blood clots.  Diabetes.  Osteoporosis. Follow these instructions at home: Lifestyle  Do not use any products that contain nicotine or tobacco, such as cigarettes, e-cigarettes, and chewing tobacco. If you need help quitting, ask your health care provider.  Get at least 30 minutes of physical activity on 5 or more days each week.  Avoid alcoholic and caffeinated beverages, as well as spicy foods. This may help prevent hot flashes.  Get 7-8 hours of sleep each night.  If you have hot flashes, try: ? Dressing in layers. ? Avoiding things that may trigger hot flashes, such as spicy food, warm places, or stress. ? Taking slow, deep   breaths when a hot flash starts. ? Keeping a fan in your home and office.  Find ways to manage stress, such as deep  breathing, meditation, or journaling.  Consider going to group therapy with other women who are having menopause symptoms. Ask your health care provider about recommended group therapy meetings. Eating and drinking  Eat a healthy, balanced diet that contains whole grains, lean protein, low-fat dairy, and plenty of fruits and vegetables.  Your health care provider may recommend adding more soy to your diet. Foods that contain soy include tofu, tempeh, and soy milk.  Eat plenty of foods that contain calcium and vitamin D for bone health. Items that are rich in calcium include low-fat milk, yogurt, beans, almonds, sardines, broccoli, and kale.   Medicines  Take over-the-counter and prescription medicines only as told by your health care provider.  Talk with your health care provider before starting any herbal supplements. If prescribed, take vitamins and supplements as told by your health care provider. General instructions  Keep track of your menstrual periods, including: ? When they occur. ? How heavy they are and how long they last. ? How much time passes between periods.  Keep track of your symptoms, noting when they start, how often you have them, and how long they last.  Use vaginal lubricants or moisturizers to help with vaginal dryness and improve comfort during sex.  Keep all follow-up visits. This is important. This includes any group therapy or counseling.   Contact a health care provider if:  You are still having menstrual periods after age 6.  You have pain during sex.  You have not had a period for 12 months and you develop vaginal bleeding. Get help right away if you have:  Severe depression.  Excessive vaginal bleeding.  Pain when you urinate.  A fast or irregular heartbeat (palpitations).  Severe headaches.  Abdominal pain or severe indigestion. Summary  Menopause is a normal time of life when menstrual periods stop completely. It is usually defined as  the absence of a menstrual period for 12 months without another medical cause.  The transition to menopause (perimenopause) most often happens between the ages of 64 and 75 and can last for several years.  Symptoms can be managed through medicines, lifestyle changes, and complementary therapies such as acupuncture.  Eat a balanced diet that is rich in nutrients to promote bone health and heart health and to manage symptoms during menopause. This information is not intended to replace advice given to you by your health care provider. Make sure you discuss any questions you have with your health care provider. Document Revised: 03/14/2020 Document Reviewed: 11/29/2019 Elsevier Patient Education  [place].   Estradiol; Levonorgestrel skin patches What is this medicine? ESTRADIOL; LEVONORGESTREL (es tra DYE ole; LEE voh nor jes trel) is used as hormone replacement in menopausal women who still have their uterus. This medicine is used to relieve the symptoms of menopause. It also helps to prevent osteoporosis in postmenopausal women. This medicine may be used for other purposes; ask your health care provider or pharmacist if you have questions. COMMON BRAND NAME(S): Climara Pro What should I tell my health care provider before I take this medicine? They need to know if you have any of these conditions:  blood vessel disease or blood clots  breast, cervical, endometrial, or uterine cancer  diabetes  endometriosis  fibroids  gallbladder disease  heart disease or recent heart attack  high blood cholesterol  high blood  pressure  high level of calcium in the blood  hysterectomy  kidney disease  liver disease  mental depression  migraine headaches  porphyria  stroke  systemic lupus erythematosus (SLE)  tobacco smoker  vaginal bleeding  an unusual or allergic reaction to estrogens, progestins, other medicines, foods, dyes, or preservatives  pregnant or  trying to get pregnant  breast-feeding How should I use this medicine? This medicine is for external use only. Follow the directions on the prescription label. Tear open the pouch, do not use scissors. Remove the stiff protective liner covering the adhesive. Try not to touch the adhesive. Apply the patch, sticky side to the skin, to an area that is clean, dry and hairless. Avoid injured, irritated, calloused, or scarred areas. Do not apply the skin patches to your breasts or around the waistline. Use a different site each time to prevent skin irritation. Do not cut or trim the patch. Do not stop using except on the advice of your doctor or health care professional. Do not wear more than one patch at a time unless you are told to do so by your doctor or health care professional. Contact your pediatrician regarding the use of this medicine in children. Special care may be needed. A patient package insert for the product will be given with each prescription and refill. Read this sheet carefully each time. The sheet may change frequently. Overdosage: If you think you have taken too much of this medicine contact a poison control center or emergency room at once. NOTE: This medicine is only for you. Do not share this medicine with others. What if I miss a dose? If you miss a dose, apply it as soon as you can. If it is almost time for your next dose, apply only that dose. Do not apply double or extra doses. What may interact with this medicine? Do not take this medicine with any of the following medications:  aromatase inhibitors like aminoglutethimide, anastrozole, exemestane, letrozole, testolactone This medicine may also interact with the following medications:  barbiturates, like phenobarbital  carbamazepine  certain antibiotics used to treat infections  grapefruit juice  medicines for fungus infections like itraconazole and ketoconazole  raloxifene or tamoxifen  rifabutin, rifampin, or  rifapentine  ritonavir  St. John's Wort This list may not describe all possible interactions. Give your health care provider a list of all the medicines, herbs, non-prescription drugs, or dietary supplements you use. Also tell them if you smoke, drink alcohol, or use illegal drugs. Some items may interact with your medicine. What should I watch for while using this medicine? Visit your doctor or health care professional for regular checks on your progress. You will need a regular breast and pelvic exam and Pap smear while on this medicine. You should also discuss the need for regular mammograms with your health care professional, and follow his or her guidelines for these tests. This medicine can make your body retain fluid, making your fingers, hands, or ankles swell. Your blood pressure can go up. Contact your doctor or health care professional if you feel you are retaining fluid. If you have any reason to think you are pregnant, stop taking this medicine right away and contact your doctor or health care professional. Smoking increases the risk of getting a blood clot or having a stroke while you are taking this medicine, especially if you are more than 54 years old. You are strongly advised not to smoke. If you wear contact lenses and notice visual changes,  or if the lenses begin to feel uncomfortable, consult your eye doctor or health care professional. If you are going to have surgery or an MRI, you may need to stop taking this medicine. Consult your health care professional for advice before you schedule the surgery. Contact with water while you are swimming, using a sauna, bathing, or showering may cause the patch to fall off. If your patch falls off reapply it. If you cannot reapply the patch, apply a new patch to another area and continue to follow your usual dose schedule. What side effects may I notice from receiving this medicine? Side effects that you should report to your doctor or  health care professional as soon as possible:  breakthrough bleeding and spotting  breast tissue changes or discharge  chest pain  leg, arm or groin pain  nausea, vomiting  severe headaches  severe stomach pain  sudden shortness of breath  yellowing of the eyes or skin Side effects that usually do not require medical attention (report to your doctor or health care professional if they continue or are bothersome):  changes in sexual desire  mood changes, anxiety, depression, frustration, anger, or emotional outbursts  increased or decreased appetite  skin rash, acne, or brown spots on the skin  symptoms of vaginal infection like itching, irritation or unusual discharge  weight gain This list may not describe all possible side effects. Call your doctor for medical advice about side effects. You may report side effects to FDA at 1-800-FDA-1088. Where should I keep my medicine? Keep out of the reach of children. Store at room temperature between 15 and 30 degrees C (59 and 86 degrees F). Do not store any patches that have been removed from their protective pouch. Throw away any unused medicine after the expiration date. Dispose of used patches properly. Since used patches may still contain active hormones, fold the patch in half so that it sticks to itself prior to disposal. NOTE: This sheet is a summary. It may not cover all possible information. If you have questions about this medicine, talk to your doctor, pharmacist, or health care provider.  2021 Elsevier/Gold Standard (2016-01-06 12:49:52)

## 2020-09-27 ENCOUNTER — Encounter: Payer: Self-pay | Admitting: Obstetrics and Gynecology

## 2020-09-29 ENCOUNTER — Telehealth: Payer: Self-pay | Admitting: Obstetrics and Gynecology

## 2020-09-29 NOTE — Telephone Encounter (Signed)
Patient states she was notified by MyChart that she has results.  She is having trouble singing in and states she would like a nurse to call her with her results.  Confirmed call back number is [phone]

## 2020-10-01 NOTE — Telephone Encounter (Signed)
Pt called no answer LM via VM of the informing concerning her labs results given by [facility]. Pt was advised that if she had any questions or concerns to please contact the office. Message sent by [facility] to pt:  Your labs show that your prediabetes has gotten slightly worse since your last check. Your cholesterol is also slowly trending upwards. I would recommend modifying your diet. Also, you would probably benefit from a medication called Metformin that could help reduce your progression to diabetes. I would encourage you to speak with your PCP, or we could have further discussion as well in the office if you would like to make an appointment.

## 2020-10-14 ENCOUNTER — Other Ambulatory Visit: Payer: Self-pay | Admitting: *Deleted

## 2020-10-14 MED ORDER — FLUCONAZOLE 150 MG PO TABS: 150.0000 mg | ORAL_TABLET | Freq: Once | ORAL | 0 refills | Status: AC

## 2020-10-16 ENCOUNTER — Other Ambulatory Visit: Payer: Self-pay | Admitting: *Deleted

## 2020-10-16 MED ORDER — CIPROFLOXACIN HCL 250 MG PO TABS: 250.0000 mg | ORAL_TABLET | Freq: Two times a day (BID) | ORAL | 0 refills | Status: AC

## 2020-11-04 ENCOUNTER — Ambulatory Visit
Admission: RE | Admit: 2020-11-04 | Discharge: 2020-11-04 | Disposition: A | Discharge status: Home / Self Care | Payer: 59 | Source: Ambulatory Visit | Attending: Obstetrics and Gynecology | Admitting: Obstetrics and Gynecology

## 2020-11-04 ENCOUNTER — Other Ambulatory Visit: Payer: Self-pay

## 2020-11-04 ENCOUNTER — Other Ambulatory Visit: Payer: Self-pay | Admitting: Obstetrics and Gynecology

## 2020-11-04 DIAGNOSIS — Z86018 Personal history of other benign neoplasm: Secondary | ICD-10-CM | POA: Diagnosis present

## 2020-11-04 DIAGNOSIS — N951 Menopausal and female climacteric states: Secondary | ICD-10-CM

## 2020-11-04 DIAGNOSIS — R102 Pelvic and perineal pain: Secondary | ICD-10-CM | POA: Diagnosis present

## 2020-11-27 ENCOUNTER — Other Ambulatory Visit: Payer: Self-pay | Admitting: *Deleted

## 2020-11-27 MED ORDER — LEVOTHYROXINE SODIUM 50 MCG PO TABS: 50.0000 ug | ORAL_TABLET | Freq: Every day | ORAL | 3 refills | Status: DC

## 2020-12-04 ENCOUNTER — Other Ambulatory Visit: Payer: Self-pay | Admitting: *Deleted

## 2020-12-04 MED ORDER — LEVOTHYROXINE SODIUM 50 MCG PO TABS: 50.0000 ug | ORAL_TABLET | Freq: Every day | ORAL | 0 refills | Status: DC

## 2020-12-12 ENCOUNTER — Other Ambulatory Visit: Payer: Self-pay | Admitting: Internal Medicine

## 2020-12-17 ENCOUNTER — Encounter: Payer: Self-pay | Admitting: Internal Medicine

## 2020-12-17 ENCOUNTER — Other Ambulatory Visit: Payer: Self-pay

## 2020-12-17 ENCOUNTER — Ambulatory Visit (INDEPENDENT_AMBULATORY_CARE_PROVIDER_SITE_OTHER): Payer: 59 | Admitting: Internal Medicine

## 2020-12-17 VITALS — BP 169/101 | HR 82 | Ht 65.0 in | Wt 201.9 lb

## 2020-12-17 DIAGNOSIS — E063 Autoimmune thyroiditis: Secondary | ICD-10-CM

## 2020-12-17 DIAGNOSIS — E038 Other specified hypothyroidism: Secondary | ICD-10-CM | POA: Diagnosis not present

## 2020-12-17 DIAGNOSIS — G43829 Menstrual migraine, not intractable, without status migrainosus: Secondary | ICD-10-CM

## 2020-12-17 DIAGNOSIS — I10 Essential (primary) hypertension: Secondary | ICD-10-CM | POA: Diagnosis not present

## 2020-12-17 DIAGNOSIS — J301 Allergic rhinitis due to pollen: Secondary | ICD-10-CM | POA: Diagnosis not present

## 2020-12-17 DIAGNOSIS — E669 Obesity, unspecified: Secondary | ICD-10-CM

## 2020-12-17 MED ORDER — LISINOPRIL-HYDROCHLOROTHIAZIDE 20-12.5 MG PO TABS: 1.0000 | ORAL_TABLET | Freq: Every day | ORAL | 3 refills | Status: DC

## 2020-12-17 NOTE — Assessment & Plan Note (Signed)
Patient was advised to continue using levothyroxine

## 2020-12-17 NOTE — Assessment & Plan Note (Signed)

## 2020-12-17 NOTE — Assessment & Plan Note (Signed)
Take Claritin 5 mg daily

## 2020-12-17 NOTE — Assessment & Plan Note (Signed)
Stable at the present time. 

## 2020-12-17 NOTE — Progress Notes (Signed)
Established Patient Office Visit  Subjective:  Patient ID: Ashley [name], female    DOB: 1965/12/22  Age: 55 y.o. MRN: [id number]  CC:  Chief Complaint  Patient presents with   Hypertension    Hypertension   Ashley [name] presents for blood pressure check, patient denies any chest pain or shortness of breath she was advised to lose weight and walk on a daily basis.  She does not smoke does not drink.  Patient allergies under control at the present time there is no symptoms of UTI  Past Medical History:  Diagnosis Date   Hypothyroidism    Thyroid enlarged    pt reports "being watched"    Past Surgical History:  Procedure Laterality Date   COLONOSCOPY WITH PROPOFOL N/A 12/26/2015   Procedure: COLONOSCOPY WITH PROPOFOL;  Surgeon: Lucilla Lame, MD;  Location: [place];  Service: Endoscopy;  Laterality: N/A;   DILATATION & CURETTAGE/HYSTEROSCOPY WITH MYOSURE N/A 02/13/2018   Procedure: DILATATION & CURETTAGE/HYSTEROSCOPY WITH GENESYSN HTA THERMAL ABLATION;  Surgeon: Rubie Maid, MD;  Location: [facility];  Service: Gynecology;  Laterality: N/A;   EXCISION MASS NECK Right 05/27/2015   Procedure: EXCISION MASS NECK RIGHT LIPOMA;  Surgeon: Clyde Canterbury, MD;  Location: [place];  Service: ENT;  Laterality: Right;   TONSILLECTOMY     TUBAL LIGATION      Family History  Problem Relation Age of Onset   Hypertension Mother    Hypertension Father    Diabetes Father     Social History   Socioeconomic History   Marital status: Single    Spouse name: Not on file   Number of children: Not on file   Years of education: Not on file   Highest education level: Not on file  Occupational History   Not on file  Tobacco Use   Smoking status: Never   Smokeless tobacco: Never  Vaping Use   Vaping Use: Never used  Substance and Sexual Activity   Alcohol use: Yes    Comment: OCC   Drug use: No   Sexual activity: Yes    Birth control/protection: None,  Surgical  Other Topics Concern   Not on file  Social History Narrative   Not on file   Social Determinants of Health   Financial Resource Strain: Not on file  Food Insecurity: Not on file  Transportation Needs: Not on file  Physical Activity: Not on file  Stress: Not on file  Social Connections: Not on file  Intimate Partner Violence: Not on file     Current Outpatient Medications:    acetaminophen (TYLENOL) 500 MG tablet, Take 500-1,000 mg by mouth daily as needed for moderate pain or headache., Disp: , Rfl:    BIOTIN PO, Take by mouth., Disp: , Rfl:    estradiol-levonorgestrel (CLIMARA PRO) 0.045-0.015 MG/DAY, Place 1 patch onto the skin once a week., Disp: 12 patch, Rfl: 3   IRON PO, Take 1 tablet by mouth daily., Disp: , Rfl:    levothyroxine (SYNTHROID) 50 MCG tablet, TAKE 1 TABLET BY MOUTH DAILY BEFORE BREAKFAST, Disp: 15 tablet, Rfl: 0   lisinopril-hydrochlorothiazide (ZESTORETIC) 20-12.5 MG tablet, Take 1 tablet by mouth daily., Disp: 90 tablet, Rfl: 3   Melatonin 5 MG TABS, Take 5 mg by mouth daily as needed (sleep)., Disp: , Rfl:    Multiple Vitamin (MULTIVITAMIN) tablet, Take 1 tablet by mouth daily., Disp: , Rfl:    rosuvastatin (CRESTOR) 20 MG tablet, Take 1 tablet (20 mg total)  by mouth daily., Disp: 90 tablet, Rfl: 3   VITAMIN D PO, Take by mouth. As needed, Disp: , Rfl:    No Known Allergies  ROS Review of Systems  Constitutional: Negative.   HENT: Negative.    Eyes: Negative.   Respiratory: Negative.    Cardiovascular: Negative.   Gastrointestinal: Negative.   Endocrine: Negative.   Genitourinary: Negative.   Musculoskeletal: Negative.   Skin: Negative.   Allergic/Immunologic: Negative.   Neurological: Negative.   Hematological: Negative.   Psychiatric/Behavioral: Negative.    All other systems reviewed and are negative.    Objective:    Physical Exam Vitals reviewed.  Constitutional:      Appearance: Normal appearance.  HENT:      Mouth/Throat:     Mouth: Mucous membranes are moist.  Eyes:     Pupils: Pupils are equal, round, and reactive to light.  Neck:     Vascular: No carotid bruit.  Cardiovascular:     Rate and Rhythm: Normal rate and regular rhythm.     Pulses: Normal pulses.     Heart sounds: Normal heart sounds.  Pulmonary:     Effort: Pulmonary effort is normal.     Breath sounds: Normal breath sounds.  Abdominal:     General: Bowel sounds are normal.     Palpations: Abdomen is soft. There is no hepatomegaly, splenomegaly or mass.     Tenderness: There is no abdominal tenderness.     Hernia: No hernia is present.  Musculoskeletal:        General: No tenderness.     Cervical back: Neck supple.     Right lower leg: No edema.     Left lower leg: No edema.  Skin:    Findings: No rash.  Neurological:     Mental Status: She is alert and oriented to person, place, and time.     Motor: No weakness.  Psychiatric:        Mood and Affect: Mood and affect normal.        Behavior: Behavior normal.    BP (!) 169/101   Pulse 82   Ht 5\' 5"  (1.651 m)   Wt 201 lb 14.4 oz (91.6 kg)   BMI 33.60 kg/m  Wt Readings from Last 3 Encounters:  12/17/20 201 lb 14.4 oz (91.6 kg)  09/25/20 202 lb 12.8 oz (92 kg)  09/16/20 203 lb 1.6 oz (92.1 kg)     Health Maintenance Due  Topic Date Due   Hepatitis C Screening  Never done   TETANUS/TDAP  Never done   COVID-19 Vaccine (3 - Booster for Moderna series) 03/16/2020   Zoster Vaccines- Shingrix (2 of 2) 05/08/2020   PAP SMEAR-Modifier  12/30/2020    There are no preventive care reminders to display for this patient.  Lab Results  Component Value Date   TSH 1.350 12/30/2017   Lab Results  Component Value Date   WBC 4.6 04/30/2020   HGB 12.6 04/30/2020   HCT 37.0 04/30/2020   MCV 91 04/30/2020   PLT 284 04/30/2020   Lab Results  Component Value Date   NA 140 04/30/2020   K 4.5 04/30/2020   CO2 25 04/30/2020   GLUCOSE 89 04/30/2020   BUN 12  04/30/2020   CREATININE 0.93 04/30/2020   BILITOT <0.2 04/30/2020   ALKPHOS 87 04/30/2020   AST 18 04/30/2020   ALT 13 04/30/2020   PROT 7.4 04/30/2020   ALBUMIN 4.6 04/30/2020   CALCIUM 9.2 04/30/2020  ANIONGAP 11 08/12/2017   Lab Results  Component Value Date   CHOL 303 (H) 04/30/2020   Lab Results  Component Value Date   HDL 133 04/30/2020   Lab Results  Component Value Date   LDLCALC 162 (H) 04/30/2020   Lab Results  Component Value Date   TRIG 56 04/30/2020   Lab Results  Component Value Date   CHOLHDL 2.3 04/30/2020   Lab Results  Component Value Date   HGBA1C 5.9 (H) 04/30/2020      Assessment & Plan:   Problem List Items Addressed This Visit       Cardiovascular and Mediastinum   Menstrual migraine without status migrainosus, not intractable    Stable at the present time       Relevant Medications   lisinopril-hydrochlorothiazide (ZESTORETIC) 20-12.5 MG tablet   Essential hypertension - Primary    Patient blood pressure is normal patient denies any chest pain or shortness of breath there is no history of palpitation or paroxysmal nocturnal dyspnea   patient was advised to follow low-salt low-cholesterol diet    ideally I want to keep systolic blood pressure below 130 mmHg, patient was asked to check blood pressure one times a week and give me a report on that.  Patient will be follow-up in 3 months  or earlier as needed, patient will call me back for any change in the cardiovascular symptoms Patient was advised to buy a book from local bookstore concerning blood pressure and read several chapters  every day.  This will be supplemented by some of the material we will give him from the office.  Patient should also utilize other resources like YouTube and Internet to learn more about the blood pressure and the diet.       Relevant Medications   lisinopril-hydrochlorothiazide (ZESTORETIC) 20-12.5 MG tablet     Respiratory   Seasonal allergic  rhinitis due to pollen    Take Claritin 5 mg daily         Endocrine   Hypothyroidism due to Hashimoto's thyroiditis    Patient was advised to continue using levothyroxine         Other   Obesity (BMI 30.0-34.9)    - I encouraged the patient to lose weight.  - I educated them on making healthy dietary choices including eating more fruits and vegetables and less fried foods. - I encouraged the patient to exercise more, and educated on the benefits of exercise including weight loss, diabetes prevention, and hypertension prevention.   Dietary counseling with a registered dietician  Referral to a weight management support group (e.g. Weight Watchers, Overeaters Anonymous)  If your BMI is greater than 29 or you have gained more than 15 pounds you should work on weight loss.  Attend a healthy cooking class         Meds ordered this encounter  Medications   lisinopril-hydrochlorothiazide (ZESTORETIC) 20-12.5 MG tablet    Sig: Take 1 tablet by mouth daily.    Dispense:  90 tablet    Refill:  3    Follow-up: No follow-ups on file.    Cletis Athens, MD

## 2020-12-17 NOTE — Assessment & Plan Note (Signed)

## 2020-12-18 MED ORDER — LISINOPRIL-HYDROCHLOROTHIAZIDE 20-12.5 MG PO TABS: 1.0000 | ORAL_TABLET | Freq: Every day | ORAL | 3 refills | Status: DC

## 2020-12-18 NOTE — Addendum Note (Signed)
Addended by: Lacretia Nicks L on: 12/18/2020 09:00 AM   Modules accepted: Orders

## 2020-12-24 ENCOUNTER — Other Ambulatory Visit: Payer: Self-pay | Admitting: Internal Medicine

## 2021-01-06 ENCOUNTER — Other Ambulatory Visit: Payer: Self-pay | Admitting: *Deleted

## 2021-01-06 MED ORDER — BACLOFEN 20 MG PO TABS: 20.0000 mg | ORAL_TABLET | Freq: Two times a day (BID) | ORAL | 0 refills | Status: DC

## 2021-01-12 ENCOUNTER — Telehealth: Payer: Self-pay | Admitting: Obstetrics and Gynecology

## 2021-01-12 NOTE — Telephone Encounter (Signed)
Please advise 

## 2021-01-12 NOTE — Telephone Encounter (Signed)
Ashley [name] called in and stated the [facility] PRO patches are causing issues.  Ashley [name] states the patches are leaving "ugly bruise like marks and it makes me look like a drug addict"  as well as leaving her arms sore and hurting.  Ashley [name] states she would like to try a pill and steer clear from any patches.  Please advise.

## 2021-01-13 MED ORDER — PREMPRO 0.45-1.5 MG PO TABS: 1.0000 | ORAL_TABLET | Freq: Every day | ORAL | 0 refills | Status: DC

## 2021-01-13 NOTE — Telephone Encounter (Signed)
Please let the patient know

## 2021-01-13 NOTE — Telephone Encounter (Signed)
Spoke to pt and informed her that [facility] had sent in the pill form of the medication estradiol. Pt was informed that at her next visit [facility] will see if that dose was okay or do changes needs to be made. Pt voiced that she understood.

## 2021-01-28 ENCOUNTER — Other Ambulatory Visit: Payer: Self-pay | Admitting: Internal Medicine

## 2021-02-25 ENCOUNTER — Other Ambulatory Visit: Payer: Self-pay

## 2021-02-25 ENCOUNTER — Encounter: Payer: Self-pay | Admitting: Internal Medicine

## 2021-02-25 ENCOUNTER — Ambulatory Visit (INDEPENDENT_AMBULATORY_CARE_PROVIDER_SITE_OTHER): Payer: 59 | Admitting: Internal Medicine

## 2021-02-25 VITALS — BP 135/84 | HR 76 | Ht 65.0 in | Wt 203.8 lb

## 2021-02-25 DIAGNOSIS — G43829 Menstrual migraine, not intractable, without status migrainosus: Secondary | ICD-10-CM | POA: Diagnosis not present

## 2021-02-25 DIAGNOSIS — E669 Obesity, unspecified: Secondary | ICD-10-CM

## 2021-02-25 DIAGNOSIS — J301 Allergic rhinitis due to pollen: Secondary | ICD-10-CM | POA: Diagnosis not present

## 2021-02-25 DIAGNOSIS — E038 Other specified hypothyroidism: Secondary | ICD-10-CM

## 2021-02-25 DIAGNOSIS — I10 Essential (primary) hypertension: Secondary | ICD-10-CM | POA: Diagnosis not present

## 2021-02-25 DIAGNOSIS — E063 Autoimmune thyroiditis: Secondary | ICD-10-CM

## 2021-02-25 MED ORDER — TRAZODONE HCL 50 MG PO TABS: 25.0000 mg | ORAL_TABLET | Freq: Every evening | ORAL | 3 refills | Status: DC | PRN

## 2021-02-25 NOTE — Assessment & Plan Note (Signed)
Take Claritin 5 mg p.o. daily as needed

## 2021-02-25 NOTE — Assessment & Plan Note (Signed)

## 2021-02-25 NOTE — Progress Notes (Signed)
Established Patient Office Visit  Subjective:  Patient ID: Ashley [name], female    DOB: 02/06/1966  Age: 55 y.o. MRN: [id number]  CC:  Chief Complaint  Patient presents with   Hypertension    2 month follow up    Hypertension   Ashley [name] presents for insomnia  Past Medical History:  Diagnosis Date   Hypothyroidism    Thyroid enlarged    pt reports "being watched"    Past Surgical History:  Procedure Laterality Date   COLONOSCOPY WITH PROPOFOL N/A 12/26/2015   Procedure: COLONOSCOPY WITH PROPOFOL;  Surgeon: Lucilla Lame, MD;  Location: [facility];  Service: Endoscopy;  Laterality: N/A;   DILATATION & CURETTAGE/HYSTEROSCOPY WITH MYOSURE N/A 02/13/2018   Procedure: DILATATION & CURETTAGE/HYSTEROSCOPY WITH GENESYSN HTA THERMAL ABLATION;  Surgeon: Rubie Maid, MD;  Location: [facility];  Service: Gynecology;  Laterality: N/A;   EXCISION MASS NECK Right 05/27/2015   Procedure: EXCISION MASS NECK RIGHT LIPOMA;  Surgeon: Clyde Canterbury, MD;  Location: [facility];  Service: ENT;  Laterality: Right;   TONSILLECTOMY     TUBAL LIGATION      Family History  Problem Relation Age of Onset   Hypertension Mother    Hypertension Father    Diabetes Father     Social History   Socioeconomic History   Marital status: Single    Spouse name: Not on file   Number of children: Not on file   Years of education: Not on file   Highest education level: Not on file  Occupational History   Not on file  Tobacco Use   Smoking status: Never   Smokeless tobacco: Never  Vaping Use   Vaping Use: Never used  Substance and Sexual Activity   Alcohol use: Yes    Comment: OCC   Drug use: No   Sexual activity: Yes    Birth control/protection: None, Surgical  Other Topics Concern   Not on file  Social History Narrative   Not on file   Social Determinants of Health   Financial Resource Strain: Not on file  Food Insecurity: Not on file  Transportation Needs:  Not on file  Physical Activity: Not on file  Stress: Not on file  Social Connections: Not on file  Intimate Partner Violence: Not on file     Current Outpatient Medications:    acetaminophen (TYLENOL) 500 MG tablet, Take 500-1,000 mg by mouth daily as needed for moderate pain or headache., Disp: , Rfl:    baclofen (LIORESAL) 20 MG tablet, TAKE 1 TABLET BY MOUTH TWICE A DAY, Disp: 60 tablet, Rfl: 6   BIOTIN PO, Take by mouth., Disp: , Rfl:    estrogen, conjugated,-medroxyprogesterone (PREMPRO) 0.45-1.5 MG tablet, Take 1 tablet by mouth daily., Disp: 90 tablet, Rfl: 0   IRON PO, Take 1 tablet by mouth daily., Disp: , Rfl:    levothyroxine (SYNTHROID) 50 MCG tablet, TAKE 1 TABLET BY MOUTH EVERY DAY BEFORE BREAKFAST, Disp: 15 tablet, Rfl: 0   lisinopril-hydrochlorothiazide (ZESTORETIC) 20-12.5 MG tablet, Take 1 tablet by mouth daily., Disp: 90 tablet, Rfl: 3   Melatonin 5 MG TABS, Take 5 mg by mouth daily as needed (sleep)., Disp: , Rfl:    Multiple Vitamin (MULTIVITAMIN) tablet, Take 1 tablet by mouth daily., Disp: , Rfl:    rosuvastatin (CRESTOR) 20 MG tablet, Take 1 tablet (20 mg total) by mouth daily., Disp: 90 tablet, Rfl: 3   traZODone (DESYREL) 50 MG tablet, Take 0.5-1 tablets (25-50 mg  total) by mouth at bedtime as needed for sleep., Disp: 30 tablet, Rfl: 3   VITAMIN D PO, Take by mouth. As needed, Disp: , Rfl:    No Known Allergies  ROS Review of Systems  Constitutional: Negative.   HENT: Negative.    Eyes: Negative.   Respiratory: Negative.    Cardiovascular: Negative.   Gastrointestinal: Negative.   Endocrine: Negative.   Genitourinary: Negative.   Musculoskeletal: Negative.   Skin: Negative.   Allergic/Immunologic: Negative.   Neurological: Negative.   Hematological: Negative.   Psychiatric/Behavioral: Negative.    All other systems reviewed and are negative.    Objective:    Physical Exam Vitals reviewed.  Constitutional:      Appearance: Normal appearance.   HENT:     Mouth/Throat:     Mouth: Mucous membranes are moist.  Eyes:     Pupils: Pupils are equal, round, and reactive to light.  Neck:     Vascular: No carotid bruit.  Cardiovascular:     Rate and Rhythm: Normal rate and regular rhythm.     Pulses: Normal pulses.     Heart sounds: Normal heart sounds.  Pulmonary:     Effort: Pulmonary effort is normal.     Breath sounds: Normal breath sounds.  Abdominal:     General: Bowel sounds are normal.     Palpations: Abdomen is soft. There is no hepatomegaly, splenomegaly or mass.     Tenderness: There is no abdominal tenderness.     Hernia: No hernia is present.  Musculoskeletal:        General: No tenderness.     Cervical back: Neck supple.     Right lower leg: No edema.     Left lower leg: No edema.  Skin:    Findings: No rash.  Neurological:     Mental Status: She is alert and oriented to person, place, and time.     Motor: No weakness.  Psychiatric:        Mood and Affect: Mood and affect normal.        Behavior: Behavior normal.    BP 135/84   Pulse 76   Ht '5\' 5"'$  (1.651 m)   Wt 203 lb 12.8 oz (92.4 kg)   BMI 33.91 kg/m  Wt Readings from Last 3 Encounters:  02/25/21 203 lb 12.8 oz (92.4 kg)  12/17/20 201 lb 14.4 oz (91.6 kg)  09/25/20 202 lb 12.8 oz (92 kg)     Health Maintenance Due  Topic Date Due   Hepatitis C Screening  Never done   TETANUS/TDAP  Never done   COVID-19 Vaccine (3 - Booster for Moderna series) 03/16/2020   Zoster Vaccines- Shingrix (2 of 2) 05/08/2020   PAP SMEAR-Modifier  12/30/2020   INFLUENZA VACCINE  01/26/2021    There are no preventive care reminders to display for this patient.  Lab Results  Component Value Date   TSH 1.350 12/30/2017   Lab Results  Component Value Date   WBC 4.6 04/30/2020   HGB 12.6 04/30/2020   HCT 37.0 04/30/2020   MCV 91 04/30/2020   PLT 284 04/30/2020   Lab Results  Component Value Date   NA 140 04/30/2020   K 4.5 04/30/2020   CO2 25 04/30/2020    GLUCOSE 89 04/30/2020   BUN 12 04/30/2020   CREATININE 0.93 04/30/2020   BILITOT <0.2 04/30/2020   ALKPHOS 87 04/30/2020   AST 18 04/30/2020   ALT 13 04/30/2020   PROT 7.4  04/30/2020   ALBUMIN 4.6 04/30/2020   CALCIUM 9.2 04/30/2020   ANIONGAP 11 08/12/2017   Lab Results  Component Value Date   CHOL 303 (H) 04/30/2020   Lab Results  Component Value Date   HDL 133 04/30/2020   Lab Results  Component Value Date   LDLCALC 162 (H) 04/30/2020   Lab Results  Component Value Date   TRIG 56 04/30/2020   Lab Results  Component Value Date   CHOLHDL 2.3 04/30/2020   Lab Results  Component Value Date   HGBA1C 5.9 (H) 04/30/2020      Assessment & Plan:   Problem List Items Addressed This Visit       Cardiovascular and Mediastinum   Menstrual migraine without status migrainosus, not intractable - Primary    Stable at the present time      Relevant Medications   traZODone (DESYREL) 50 MG tablet   Essential hypertension     Patient denies any chest pain or shortness of breath there is no history of palpitation or paroxysmal nocturnal dyspnea   patient was advised to follow low-salt low-cholesterol diet    ideally I want to keep systolic blood pressure below 130 mmHg, patient was asked to check blood pressure one times a week and give me a report on that.  Patient will be follow-up in 3 months  or earlier as needed, patient will call me back for any change in the cardiovascular symptoms Patient was advised to buy a book from local bookstore concerning blood pressure and read several chapters  every day.  This will be supplemented by some of the material we will give him from the office.  Patient should also utilize other resources like YouTube and Internet to learn more about the blood pressure and the diet.        Respiratory   Seasonal allergic rhinitis due to pollen    Take Claritin 5 mg p.o. daily as needed        Endocrine   Hypothyroidism due to Hashimoto's  thyroiditis    Take thyroid medication in the morning before breakfast        Other   Obesity (BMI 30.0-34.9)    - I encouraged the patient to lose weight.  - I educated them on making healthy dietary choices including eating more fruits and vegetables and less fried foods. - I encouraged the patient to exercise more, and educated on the benefits of exercise including weight loss, diabetes prevention, and hypertension prevention.   Dietary counseling with a registered dietician  Referral to a weight management support group (e.g. Weight Watchers, Overeaters Anonymous)  If your BMI is greater than 29 or you have gained more than 15 pounds you should work on weight loss.  Attend a healthy cooking class        Meds ordered this encounter  Medications   traZODone (DESYREL) 50 MG tablet    Sig: Take 0.5-1 tablets (25-50 mg total) by mouth at bedtime as needed for sleep.    Dispense:  30 tablet    Refill:  3    Follow-up: No follow-ups on file.    Cletis Athens, MD

## 2021-02-25 NOTE — Assessment & Plan Note (Signed)
Take thyroid medication in the morning before breakfast

## 2021-02-25 NOTE — Assessment & Plan Note (Signed)

## 2021-02-25 NOTE — Assessment & Plan Note (Signed)
Stable at the present time. 

## 2021-02-26 ENCOUNTER — Other Ambulatory Visit: Payer: Self-pay | Admitting: Internal Medicine

## 2021-03-09 ENCOUNTER — Other Ambulatory Visit: Payer: Self-pay

## 2021-03-10 NOTE — Patient Instructions (Addendum)
Breast Self-Awareness Breast self-awareness is knowing how your breasts look and feel. Doing breast self-awareness is important. It allows you to catch a breast problem early while it is still small and can be treated. All women should do breast self-awareness, including women who have had breast implants. Tell your doctor if you notice a change in your breasts. What you need: A mirror. A well-lit room. How to do a breast self-exam A breast self-exam is one way to learn what is normal for your breasts and to check for changes. To do a breast self-exam: Look for changes  Take off all the clothes above your waist. Stand in front of a mirror in a room with good lighting. Put your hands on your hips. Push your hands down. Look at your breasts and nipples in the mirror to see if one breast or nipple looks different from the other. Check to see if: The shape of one breast is different. The size of one breast is different. There are wrinkles, dips, and bumps in one breast and not the other. Look at each breast for changes in the skin, such as: Redness. Scaly areas. Look for changes in your nipples, such as: Liquid around the nipples. Bleeding. Dimpling. Redness. A change in where the nipples are. Feel for changes  Lie on your back on the floor. Feel each breast. To do this, follow these steps: Pick a breast to feel. Put the arm closest to that breast above your head. Use your other arm to feel the nipple area of your breast. Feel the area with the pads of your three middle fingers by making small circles with your fingers. For the first circle, press lightly. For the second circle, press harder. For the third circle, press even harder. Keep making circles with your fingers at the different pressures as you move down your breast. Stop when you feel your ribs. Move your fingers a little toward the center of your body. Start making circles with your fingers again, this time going up until  you reach your collarbone. Keep making up-and-down circles until you reach your armpit. Remember to keep using the three pressures. Feel the other breast in the same way. Sit or stand in the tub or shower. With soapy water on your skin, feel each breast the same way you did in step 2 when you were lying on the floor. Write down what you find Writing down what you find can help you remember what to tell your doctor. Write down: What is normal for each breast. Any changes you find in each breast, including: The kind of changes you find. Whether you have pain. Size and location of any lumps. When you last had your menstrual period. General tips Check your breasts every month. If you are breastfeeding, the best time to check your breasts is after you feed your baby or after you use a breast pump. If you get menstrual periods, the best time to check your breasts is 5-7 days after your menstrual period is over. With time, you will become comfortable with the self-exam, and you will begin to know if there are changes in your breasts. Contact a doctor if you: See a change in the shape or size of your breasts or nipples. See a change in the skin of your breast or nipples, such as red or scaly skin. Have fluid coming from your nipples that is not normal. Find a lump or thick area that was not there before. Have pain in   your breasts. Have any concerns about your breast health. Summary Breast self-awareness includes looking for changes in your breasts, as well as feeling for changes within your breasts. Breast self-awareness should be done in front of a mirror in a well-lit room. You should check your breasts every month. If you get menstrual periods, the best time to check your breasts is 5-7 days after your menstrual period is over. Let your doctor know of any changes you see in your breasts, including changes in size, changes on the skin, pain or tenderness, or fluid from your nipples that is not  normal. This information is not intended to replace advice given to you by your health care provider. Make sure you discuss any questions you have with your health care provider. Document Revised: 01/31/2018 Document Reviewed: 01/31/2018 Elsevier Patient Education  [place].     Influenza (Flu) Vaccine (Inactivated or Recombinant): What You Need to Know 1. Why get vaccinated? Influenza vaccine can prevent influenza (flu). Flu is a contagious disease that spreads around the [place] every year, usually between October and May. Anyone can get the flu, but it is more dangerous for some people. Infants and young children, people 65 years and older, pregnant people, and people with certain health conditions or a weakened immune system are at greatest risk of flu complications. Pneumonia, bronchitis, sinus infections, and ear infections are examples of flu-related complications. If you have a medical condition, such as heart disease, cancer, or diabetes, flu can make it worse. Flu can cause fever and chills, sore throat, muscle aches, fatigue, cough, headache, and runny or stuffy nose. Some people may have vomiting and diarrhea, though this is more common in children than adults. In an average year, thousands of people in the [place] die from flu, and many more are hospitalized. Flu vaccine prevents millions of illnesses and flu-related visits to the doctor each year. 2. Influenza vaccines CDC recommends everyone 6 months and older get vaccinated every flu season. Children 6 months through 36 years of age may need 2 doses during a single flu season. Everyone else needs only 1 dose each flu season. It takes about 2 weeks for protection to develop after vaccination. There are many flu viruses, and they are always changing. Each year a new flu vaccine is made to protect against the influenza viruses believed to be likely to cause disease in the upcoming flu season. Even when the vaccine  doesn't exactly match these viruses, it may still provide some protection. Influenza vaccine does not cause flu. Influenza vaccine may be given at the same time as other vaccines. 3. Talk with your health care provider Tell your vaccination provider if the person getting the vaccine: Has had an allergic reaction after a previous dose of influenza vaccine, or has any severe, life-threatening allergies Has ever had Guillain-Barr Syndrome (also called "GBS") In some cases, your health care provider may decide to postpone influenza vaccination until a future visit. Influenza vaccine can be administered at any time during pregnancy. People who are or will be pregnant during influenza season should receive inactivated influenza vaccine. People with minor illnesses, such as a cold, may be vaccinated. People who are moderately or severely ill should usually wait until they recover before getting influenza vaccine. Your health care provider can give you more information. 4. Risks of a vaccine reaction Soreness, redness, and swelling where the shot is given, fever, muscle aches, and headache can happen after influenza vaccination. There may be a  very small increased risk of Guillain-Barr Syndrome (GBS) after inactivated influenza vaccine (the flu shot). Young children who get the flu shot along with pneumococcal vaccine (PCV13) and/or DTaP vaccine at the same time might be slightly more likely to have a seizure caused by fever. Tell your health care provider if a child who is getting flu vaccine has ever had a seizure. People sometimes faint after medical procedures, including vaccination. Tell your provider if you feel dizzy or have vision changes or ringing in the ears. As with any medicine, there is a very remote chance of a vaccine causing a severe allergic reaction, other serious injury, or death. 5. What if there is a serious problem? An allergic reaction could occur after the vaccinated person  leaves the clinic. If you see signs of a severe allergic reaction (hives, swelling of the face and throat, difficulty breathing, a fast heartbeat, dizziness, or weakness), call 9-1-1 and get the person to the nearest hospital. For other signs that concern you, call your health care provider. Adverse reactions should be reported to the Vaccine Adverse Event Reporting System (VAERS). Your health care provider will usually file this report, or you can do it yourself. Visit the VAERS website at www.vaers.SamedayNews.es or call (512) 440-7083. VAERS is only for reporting reactions, and VAERS staff members do not give medical advice. 6. The National Vaccine Injury Compensation Program The [employer] Vaccine Injury Compensation Program (VICP) is a federal program that was created to compensate people who may have been injured by certain vaccines. Claims regarding alleged injury or death due to vaccination have a time limit for filing, which may be as short as two years. Visit the VICP website at GoldCloset.com.ee or call 541-875-2277 to learn about the program and about filing a claim. 7. How can I learn more? Ask your health care provider. Call your local or state health department. Visit the website of the Food and Drug Administration (FDA) for vaccine package inserts and additional information at TraderRating.uy. Contact the Centers for Disease Control and Prevention (CDC): Call 3437425327 (1-800-CDC-INFO) or Visit CDC's website at https://gibson.com/. Vaccine Information Statement Inactivated Influenza Vaccine (02/01/2020) This information is not intended to replace advice given to you by your health care provider. Make sure you discuss any questions you have with your health care provider. Document Revised: 03/20/2020 Document Reviewed: 03/20/2020 Elsevier Patient Education  2022 Elsevier Inc.      Preventive Care 50-80 Years Old, Female Preventive care refers  to lifestyle choices and visits with your health care provider that can promote health and wellness. This includes: A yearly physical exam. This is also called an annual wellness visit. Regular dental and eye exams. Immunizations. Screening for certain conditions. Healthy lifestyle choices, such as: Eating a healthy diet. Getting regular exercise. Not using drugs or products that contain nicotine and tobacco. Limiting alcohol use. What can I expect for my preventive care visit? Physical exam Your health care provider will check your: Height and weight. These may be used to calculate your BMI (body mass index). BMI is a measurement that tells if you are at a healthy weight. Heart rate and blood pressure. Body temperature. Skin for abnormal spots. Counseling Your health care provider may ask you questions about your: Past medical problems. Family's medical history. Alcohol, tobacco, and drug use. Emotional well-being. Home life and relationship well-being. Sexual activity. Diet, exercise, and sleep habits. Work and work Statistician. Access to firearms. Method of birth control. Menstrual cycle. Pregnancy history. What immunizations do I need? Vaccines  are usually given at various ages, according to a schedule. Your health care provider will recommend vaccines for you based on your age, medical history, and lifestyle or other factors, such as travel or where you work. What tests do I need? Blood tests Lipid and cholesterol levels. These may be checked every 5 years, or more often if you are over 54 years old. Hepatitis C test. Hepatitis B test. Screening Lung cancer screening. You may have this screening every year starting at age 66 if you have a 30-pack-year history of smoking and currently smoke or have quit within the past 15 years. Colorectal cancer screening. All adults should have this screening starting at age 21 and continuing until age 62. Your health care provider may  recommend screening at age 9 if you are at increased risk. You will have tests every 1-10 years, depending on your results and the type of screening test. Diabetes screening. This is done by checking your blood sugar (glucose) after you have not eaten for a while (fasting). You may have this done every 1-3 years. Mammogram. This may be done every 1-2 years. Talk with your health care provider about when you should start having regular mammograms. This may depend on whether you have a family history of breast cancer. BRCA-related cancer screening. This may be done if you have a family history of breast, ovarian, tubal, or peritoneal cancers. Pelvic exam and Pap test. This may be done every 3 years starting at age 18. Starting at age 36, this may be done every 5 years if you have a Pap test in combination with an HPV test. Other tests STD (sexually transmitted disease) testing, if you are at risk. Bone density scan. This is done to screen for osteoporosis. You may have this scan if you are at high risk for osteoporosis. Talk with your health care provider about your test results, treatment options, and if necessary, the need for more tests. Follow these instructions at home: Eating and drinking  Eat a diet that includes fresh fruits and vegetables, whole grains, lean protein, and low-fat dairy products. Take vitamin and mineral supplements as recommended by your health care provider. Do not drink alcohol if: Your health care provider tells you not to drink. You are pregnant, may be pregnant, or are planning to become pregnant. If you drink alcohol: Limit how much you have to 0-1 drink a day. Be aware of how much alcohol is in your drink. In the [place] one drink equals one 12 oz bottle of beer (355 mL), one 5 oz glass of wine (148 mL), or one 1 oz glass of hard liquor (44 mL). Lifestyle Take daily care of your teeth and gums. Brush your teeth every morning and night with fluoride  toothpaste. Floss one time each day. Stay active. Exercise for at least 30 minutes 5 or more days each week. Do not use any products that contain nicotine or tobacco, such as cigarettes, e-cigarettes, and chewing tobacco. If you need help quitting, ask your health care provider. Do not use drugs. If you are sexually active, practice safe sex. Use a condom or other form of protection to prevent STIs (sexually transmitted infections). If you do not wish to become pregnant, use a form of birth control. If you plan to become pregnant, see your health care provider for a prepregnancy visit. If told by your health care provider, take low-dose aspirin daily starting at age 34. Find healthy ways to cope with stress, such as: Meditation,  yoga, or listening to music. Journaling. Talking to a trusted person. Spending time with friends and family. Safety Always wear your seat belt while driving or riding in a vehicle. Do not drive: If you have been drinking alcohol. Do not ride with someone who has been drinking. When you are tired or distracted. While texting. Wear a helmet and other protective equipment during sports activities. If you have firearms in your house, make sure you follow all gun safety procedures. What's next? Visit your health care provider once a year for an annual wellness visit. Ask your health care provider how often you should have your eyes and teeth checked. Stay up to date on all vaccines. This information is not intended to replace advice given to you by your health care provider. Make sure you discuss any questions you have with your health care provider. Document Revised: 08/22/2020 Document Reviewed: 02/23/2018 Elsevier Patient Education  2022 [employer].

## 2021-03-11 ENCOUNTER — Encounter: Payer: Self-pay | Admitting: Obstetrics and Gynecology

## 2021-03-11 ENCOUNTER — Other Ambulatory Visit: Payer: Self-pay

## 2021-03-11 ENCOUNTER — Ambulatory Visit (INDEPENDENT_AMBULATORY_CARE_PROVIDER_SITE_OTHER): Payer: 59 | Admitting: Obstetrics and Gynecology

## 2021-03-11 VITALS — BP 133/86 | HR 82 | Ht 65.0 in | Wt 199.0 lb

## 2021-03-11 DIAGNOSIS — Z1231 Encounter for screening mammogram for malignant neoplasm of breast: Secondary | ICD-10-CM | POA: Diagnosis not present

## 2021-03-11 DIAGNOSIS — Z86018 Personal history of other benign neoplasm: Secondary | ICD-10-CM

## 2021-03-11 DIAGNOSIS — Z01419 Encounter for gynecological examination (general) (routine) without abnormal findings: Secondary | ICD-10-CM | POA: Diagnosis not present

## 2021-03-11 DIAGNOSIS — Z23 Encounter for immunization: Secondary | ICD-10-CM | POA: Diagnosis not present

## 2021-03-11 DIAGNOSIS — N951 Menopausal and female climacteric states: Secondary | ICD-10-CM

## 2021-03-11 DIAGNOSIS — I10 Essential (primary) hypertension: Secondary | ICD-10-CM

## 2021-03-11 DIAGNOSIS — E039 Hypothyroidism, unspecified: Secondary | ICD-10-CM

## 2021-03-11 MED ORDER — ESTRADIOL-NORETHINDRONE ACET 0.05-0.14 MG/DAY TD PTTW: 1.0000 | MEDICATED_PATCH | TRANSDERMAL | 12 refills | Status: DC

## 2021-03-11 NOTE — Progress Notes (Signed)
GYNECOLOGY ANNUAL PHYSICAL EXAM PROGRESS NOTE  Subjective:    Ashley [name] is a 55 y.o. G26P1001 female who presents for an annual exam.The patient is sexually active.The patient wears seatbelts: yes. The patient participates in regular exercise: yes. Has the patient ever been transfused or tattooed?: yes. The patient reports that there is not domestic violence in her life.    The patient has the following complaints today: 1. Patient still noting some mild hot flushes more so at night. States she is not taking Prempo anymore. Felt like it did not help.  2. Patient stated that she was having problems sleeping at night.   Gynecologic History  Menarche age: 60 No LMP recorded. Patient is perimenopausal. Menstrual cycles infrequent. Also has h/o endometrial ablation.  Contraception: tubal ligation History of STI's: Denies Last Pap: 12/30/2017. Results were: normal.  Denies h/o abnormal pap smears. Last mammogram: 06/06/2020 Results were: normal Last colonoscopy: 11/2015. Results were: normal.    OB History  Gravida Para Term Preterm AB Living  '1 1 1 '$ 0 0 1  SAB IAB Ectopic Multiple Live Births  0 0 0 0 1    # Outcome Date GA Lbr Len/2nd Weight Sex Delivery Anes PTL Lv  1 Term 1992 [redacted]w[redacted]d 6 lb 7 oz (2.92 kg) F Vag-Spont   LIV    Past Medical History:  Diagnosis Date   Hypothyroidism    Thyroid enlarged    pt reports "being watched"    Past Surgical History:  Procedure Laterality Date   COLONOSCOPY WITH PROPOFOL N/A 12/26/2015   Procedure: COLONOSCOPY WITH PROPOFOL;  Surgeon: DLucilla Lame MD;  [place] [facility]  Service: Endoscopy;  Laterality: N/A;   DILATATION & CURETTAGE/HYSTEROSCOPY WITH MYOSURE N/A 02/13/2018   Procedure: DILATATION & CURETTAGE/HYSTEROSCOPY WITH GENESYSN HTA THERMAL ABLATION;  Surgeon: CRubie Maid MD;  Location: [facility];  Service: Gynecology;  Laterality: N/A;   EXCISION MASS NECK Right 05/27/2015   Procedure: EXCISION MASS NECK  RIGHT LIPOMA;  Surgeon: PClyde Canterbury MD;  [place] [facility]  Service: ENT;  Laterality: Right;   TONSILLECTOMY     TUBAL LIGATION      Family History  Problem Relation Age of Onset   Hypertension Mother    Hypertension Father    Diabetes Father     Social History   Socioeconomic History   Marital status: Single    Spouse name: Not on file   Number of children: Not on file   Years of education: Not on file   Highest education level: Not on file  Occupational History   Not on file  Tobacco Use   Smoking status: Never   Smokeless tobacco: Never  Vaping Use   Vaping Use: Never used  Substance and Sexual Activity   Alcohol use: Yes    Comment: OCC   Drug use: No   Sexual activity: Yes    Birth control/protection: None, Surgical  Other Topics Concern   Not on file  Social History Narrative   Not on file   Social Determinants of Health   Financial Resource Strain: Not on file  Food Insecurity: Not on file  Transportation Needs: Not on file  Physical Activity: Not on file  Stress: Not on file  Social Connections: Not on file  Intimate Partner Violence: Not on file    Current Outpatient Medications on File Prior to Visit  Medication Sig Dispense Refill   acetaminophen (TYLENOL) 500 MG tablet Take 500-1,000 mg by mouth  daily as needed for moderate pain or headache.     baclofen (LIORESAL) 20 MG tablet TAKE 1 TABLET BY MOUTH TWICE A DAY (Patient taking differently: As needed) 60 tablet 6   BIOTIN PO Take by mouth. As needed     ibuprofen (ADVIL) 800 MG tablet TAKE 1 TABLET BY MOUTH EVERY 8 HOURS AS NEEDED FOR MODERATE PAIN. 60 tablet 1   IRON PO Take 1 tablet by mouth daily.     levothyroxine (SYNTHROID) 50 MCG tablet TAKE 1 TABLET BY MOUTH EVERY DAY BEFORE BREAKFAST 15 tablet 0   lisinopril-hydrochlorothiazide (ZESTORETIC) 20-12.5 MG tablet Take 1 tablet by mouth daily. 90 tablet 3   Multiple Vitamin (MULTIVITAMIN) tablet Take 1 tablet by mouth daily.      rosuvastatin (CRESTOR) 20 MG tablet Take 1 tablet (20 mg total) by mouth daily. 90 tablet 3   traZODone (DESYREL) 50 MG tablet Take 0.5-1 tablets (25-50 mg total) by mouth at bedtime as needed for sleep. 30 tablet 3   VITAMIN D PO Take by mouth. As needed     No current facility-administered medications on file prior to visit.    No Known Allergies   Review of Systems Constitutional: negative for chills, fatigue, fevers.  Positive for mild hot flushes and sweats Eyes: negative for irritation, redness and visual disturbance Ears, nose, mouth, throat, and face: negative for hearing loss, nasal congestion, snoring and tinnitus Respiratory: negative for asthma, cough, sputum Cardiovascular: negative for chest pain, dyspnea, exertional chest pressure/discomfort, irregular heart beat, palpitations and syncope Gastrointestinal: negative for abdominal pain, change in bowel habits, nausea and vomiting Genitourinary:  Negative for genital lesions, abnormal uterine bleeding,sexual problems and vaginal discharge, dysuria and urinary incontinence Integument/breast: negative for breast lump, breast tenderness and nipple discharge Hematologic/lymphatic: negative for bleeding and easy bruising Musculoskeletal:negative for back pain and muscle weakness Neurological: negative for dizziness, headaches, vertigo and weakness Endocrine: negative for diabetic symptoms including polydipsia, polyuria and skin dryness Allergic/Immunologic: negative for hay fever and urticaria    Psychological: negative for  anxiety, depression, or irritability. Positive for - sleep disturbances     Objective:  Blood pressure 133/86, pulse 82, height '5\' 5"'$  (1.651 m), weight 199 lb (90.3 kg). Body mass index is 33.12 kg/m.   General Appearance:    Alert, cooperative, no distress, appears stated age, mild obesity  Head:    Normocephalic, without obvious abnormality, atraumatic  Eyes:    PERRL, conjunctiva/corneas clear,  EOM's intact, both eyes  Ears:    Normal external ear canals, both ears  Nose:   Nares normal, septum midline, mucosa normal, no drainage or sinus tenderness  Throat:   Lips, mucosa, and tongue normal; teeth and gums normal  Neck:   Supple, symmetrical, trachea midline, no adenopathy; thyroid: no enlargement/tenderness/nodules; no carotid bruit or JVD  Back:     Symmetric, no curvature, ROM normal, no CVA tenderness  Lungs:     Clear to auscultation bilaterally, respirations unlabored  Chest Wall:    No tenderness or deformity   Heart:    Regular rate and rhythm, S1 and S2 normal, no murmur, rub or gallop  Breast Exam:    No tenderness, masses, or nipple abnormality  Abdomen:     Soft, non-tender, bowel sounds active all four quadrants, no masses, no organomegaly.    Genitalia:    Pelvic:external genitalia normal, vagina without lesions, discharge, or tenderness, rectovaginal septum  normal. Cervix normal in appearance, no cervical motion tenderness, no adnexal masses or tenderness.  Uterus normal size, shape, mobile, regular contours, nontender.  Rectal:    Normal external sphincter.  No hemorrhoids appreciated. Internal exam not done.   Extremities:   Extremities normal, atraumatic, no cyanosis or edema  Pulses:   2+ and symmetric all extremities  Skin:   Skin color, texture, turgor normal, no rashes or lesions  Lymph nodes:   Cervical, supraclavicular, and axillary nodes normal  Neurologic:   CNII-XII intact, normal strength, sensation and reflexes throughout   .  Labs:  Lab Results  Component Value Date   WBC 4.6 04/30/2020   HGB 12.6 04/30/2020   HCT 37.0 04/30/2020   MCV 91 04/30/2020   PLT 284 04/30/2020    Lab Results  Component Value Date   CREATININE 0.93 04/30/2020   BUN 12 04/30/2020   NA 140 04/30/2020   K 4.5 04/30/2020   CL 103 04/30/2020   CO2 25 04/30/2020    Lab Results  Component Value Date   ALT 13 04/30/2020   AST 18 04/30/2020   ALKPHOS 87 04/30/2020    BILITOT <0.2 04/30/2020    Lab Results  Component Value Date   TSH 1.350 12/30/2017     Assessment:   Routine gynecologic exam.   Perimenopausal vasomotor syndrome Hypertension Obesity Acquired Hypothyroidism  Plan:  Routine Gynecologic Exam: -Blood tests ordered: will have labs done by PCP in next 1-2 months.  -Breast self exam technique reviewed and patient encouraged to perform self-exam monthly. -Contraception: tubal ligation. -Discussed healthy lifestyle modifications. -Mammogram ordered -Pap smear not completed today. Continue routine q 5 year screening. Next due in 2024.  - Flu vaccination completed today.  - COVID -19 vaccination status: Vaccination series completed, can consider booster when eligible. Patient had 2 vaccines completed.   Perimenopausal Vasomotor Syndrome:  -Patient is still experiencing hot flashes at night, but denies them during the day. Notes that the patches were the only thing that worked, but began having a skin reaction. Discussed option of twice weekly patches instead of the weekly, or trying hormonal gel (but would need oral supplementation with progesterone or do 21 days on/7 days off dosing). Patient ok with trying the patch. Will prescribe Combi-patch. Patient also likely transitioned into menopause based on age. Can check hormone levels next visit.   Obesity: -Patient remains active at work. Encouraged healthy lifestyle modifications.   Hypertension: -HTN, currently on Lisinopril-hydrochlorothiazide. Managed by PCP  Acquired Hypothyroidism: -Hypothyroidism managed by Endocrinologist. Desires referral to a new Endocrinologist.    Follow up: 1 year for annual exam. Follow up sooner if symptoms persist and would like to consider other medical management.      Edwyna Shell, LPN [facility]

## 2021-03-19 ENCOUNTER — Other Ambulatory Visit: Payer: Self-pay | Admitting: Internal Medicine

## 2021-03-19 DIAGNOSIS — G43829 Menstrual migraine, not intractable, without status migrainosus: Secondary | ICD-10-CM

## 2021-04-01 ENCOUNTER — Ambulatory Visit: Payer: 59 | Admitting: Internal Medicine

## 2021-04-01 ENCOUNTER — Other Ambulatory Visit: Payer: Self-pay

## 2021-04-06 ENCOUNTER — Ambulatory Visit: Payer: 59 | Admitting: Internal Medicine

## 2021-04-20 ENCOUNTER — Other Ambulatory Visit: Payer: Self-pay | Admitting: *Deleted

## 2021-04-20 DIAGNOSIS — E7800 Pure hypercholesterolemia, unspecified: Secondary | ICD-10-CM

## 2021-04-20 MED ORDER — ROSUVASTATIN CALCIUM 20 MG PO TABS: 20.0000 mg | ORAL_TABLET | Freq: Every day | ORAL | 3 refills | Status: DC

## 2021-05-12 ENCOUNTER — Encounter: Payer: 59 | Admitting: Obstetrics and Gynecology

## 2021-05-25 ENCOUNTER — Other Ambulatory Visit: Payer: Self-pay | Admitting: *Deleted

## 2021-05-25 MED ORDER — LORATADINE 10 MG PO TABS: 10.0000 mg | ORAL_TABLET | Freq: Every day | ORAL | 11 refills | Status: DC

## 2021-05-25 MED ORDER — AZITHROMYCIN 250 MG PO TABS
ORAL_TABLET | ORAL | 0 refills | Status: AC
Start: 2021-05-25 — End: 2021-05-30

## 2021-05-25 MED ORDER — BENZONATATE 100 MG PO CAPS: 100.0000 mg | ORAL_CAPSULE | Freq: Two times a day (BID) | ORAL | 0 refills | Status: DC | PRN

## 2021-06-08 ENCOUNTER — Ambulatory Visit
Admission: RE | Admit: 2021-06-08 | Discharge: 2021-06-08 | Disposition: A | Discharge status: Home / Self Care | Payer: 59 | Source: Ambulatory Visit | Attending: Obstetrics and Gynecology | Admitting: Obstetrics and Gynecology

## 2021-06-08 ENCOUNTER — Other Ambulatory Visit: Payer: Self-pay

## 2021-06-08 DIAGNOSIS — Z1231 Encounter for screening mammogram for malignant neoplasm of breast: Secondary | ICD-10-CM | POA: Insufficient documentation

## 2021-07-03 ENCOUNTER — Telehealth: Payer: Self-pay

## 2021-07-03 NOTE — Telephone Encounter (Signed)
MEDICATION: fluconazole (DIFLUCAN)  Is the patient out of medication? yes  PHARMACY: [id number] [address], [place]  Please notify patient to allow 48-72 hours to process

## 2021-07-08 ENCOUNTER — Other Ambulatory Visit: Payer: Self-pay

## 2021-07-08 MED ORDER — FLUCONAZOLE 150 MG PO TABS: 150.0000 mg | ORAL_TABLET | Freq: Once | ORAL | 0 refills | Status: AC

## 2021-07-08 NOTE — Telephone Encounter (Signed)
Pt called to get the refilled and she has been scheduled with a provider.

## 2021-07-14 ENCOUNTER — Encounter: Payer: Self-pay | Admitting: Certified Nurse Midwife

## 2021-07-14 ENCOUNTER — Other Ambulatory Visit: Payer: Self-pay

## 2021-07-14 ENCOUNTER — Other Ambulatory Visit (HOSPITAL_COMMUNITY)
Admission: RE | Admit: 2021-07-14 | Discharge: 2021-07-14 | Disposition: A | Discharge status: Home / Self Care | Payer: 59 | Source: Ambulatory Visit | Attending: Certified Nurse Midwife | Admitting: Certified Nurse Midwife

## 2021-07-14 ENCOUNTER — Ambulatory Visit (INDEPENDENT_AMBULATORY_CARE_PROVIDER_SITE_OTHER): Payer: 59 | Admitting: Certified Nurse Midwife

## 2021-07-14 VITALS — BP 118/83 | HR 89 | Ht 65.0 in | Wt 194.7 lb

## 2021-07-14 DIAGNOSIS — N898 Other specified noninflammatory disorders of vagina: Secondary | ICD-10-CM | POA: Insufficient documentation

## 2021-07-14 DIAGNOSIS — N949 Unspecified condition associated with female genital organs and menstrual cycle: Secondary | ICD-10-CM

## 2021-07-14 NOTE — Progress Notes (Signed)
GYN ENCOUNTER NOTE  Subjective:       Ashley [name] is a 56 y.o. G57P1001 female is here for gynecologic evaluation of the following issues:  1. Increased vaginal discharge for 3 wks. She denies odor, itching , buring. She does admit to having pain lower left quadrant. She has history of bacterial vaginosis and ovarian cysts.      Gynecologic History No LMP recorded (lmp unknown). Patient is perimenopausal. Contraception: tubal ligation Last Pap: 12/30/2017. Results were: normal Last mammogram: 06/08/2021. Results were: normal  Obstetric History OB History  Gravida Para Term Preterm AB Living  1 1 1     1   SAB IAB Ectopic Multiple Live Births          1    # Outcome Date GA Lbr Len/2nd Weight Sex Delivery Anes PTL Lv  1 Term 1992 [redacted]w[redacted]d  6 lb 7 oz (2.92 kg) F Vag-Spont   LIV    Past Medical History:  Diagnosis Date   Hypothyroidism    Thyroid enlarged    pt reports "being watched"    Past Surgical History:  Procedure Laterality Date   COLONOSCOPY WITH PROPOFOL N/A 12/26/2015   Procedure: COLONOSCOPY WITH PROPOFOL;  Surgeon: Lucilla Lame, MD;  Location: [place];  Service: Endoscopy;  Laterality: N/A;   DILATATION & CURETTAGE/HYSTEROSCOPY WITH MYOSURE N/A 02/13/2018   Procedure: DILATATION & CURETTAGE/HYSTEROSCOPY WITH GENESYSN HTA THERMAL ABLATION;  Surgeon: Rubie Maid, MD;  Location: [facility];  Service: Gynecology;  Laterality: N/A;   EXCISION MASS NECK Right 05/27/2015   Procedure: EXCISION MASS NECK RIGHT LIPOMA;  Surgeon: Clyde Canterbury, MD;  Location: [place];  Service: ENT;  Laterality: Right;   TONSILLECTOMY     TUBAL LIGATION      Current Outpatient Medications on File Prior to Visit  Medication Sig Dispense Refill   ibuprofen (ADVIL) 800 MG tablet TAKE 1 TABLET BY MOUTH EVERY 8 HOURS AS NEEDED FOR MODERATE PAIN. 60 tablet 1   levothyroxine (SYNTHROID) 50 MCG tablet TAKE 1 TABLET BY MOUTH EVERY DAY BEFORE BREAKFAST 15 tablet 0    lisinopril-hydrochlorothiazide (ZESTORETIC) 20-12.5 MG tablet Take 1 tablet by mouth daily. 90 tablet 3   acetaminophen (TYLENOL) 500 MG tablet Take 500-1,000 mg by mouth daily as needed for moderate pain or headache.     baclofen (LIORESAL) 20 MG tablet TAKE 1 TABLET BY MOUTH TWICE A DAY (Patient taking differently: As needed) 60 tablet 6   benzonatate (TESSALON) 100 MG capsule Take 1 capsule (100 mg total) by mouth 2 (two) times daily as needed for cough. 60 capsule 0   BIOTIN PO Take by mouth. As needed     estradiol-norethindrone (COMBIPATCH) 0.05-0.14 MG/DAY Place 1 patch onto the skin 2 (two) times a week. 8 patch 12   IRON PO Take 1 tablet by mouth daily.     loratadine (CLARITIN) 10 MG tablet Take 1 tablet (10 mg total) by mouth daily. 30 tablet 11   Multiple Vitamin (MULTIVITAMIN) tablet Take 1 tablet by mouth daily.     rosuvastatin (CRESTOR) 20 MG tablet Take 1 tablet (20 mg total) by mouth daily. 90 tablet 3   traZODone (DESYREL) 50 MG tablet TAKE 0.5-1 TABLETS BY MOUTH AT BEDTIME AS NEEDED FOR SLEEP. 90 tablet 2   VITAMIN D PO Take by mouth. As needed     No current facility-administered medications on file prior to visit.    No Known Allergies  Social History   Socioeconomic History  Marital status: Single    Spouse name: Not on file   Number of children: Not on file   Years of education: Not on file   Highest education level: Not on file  Occupational History   Not on file  Tobacco Use   Smoking status: Never   Smokeless tobacco: Never  Vaping Use   Vaping Use: Never used  Substance and Sexual Activity   Alcohol use: Yes    Comment: OCC   Drug use: No   Sexual activity: Yes    Birth control/protection: None, Surgical  Other Topics Concern   Not on file  Social History Narrative   Not on file   Social Determinants of Health   Financial Resource Strain: Not on file  Food Insecurity: Not on file  Transportation Needs: Not on file  Physical Activity: Not  on file  Stress: Not on file  Social Connections: Not on file  Intimate Partner Violence: Not on file    Family History  Problem Relation Age of Onset   Hypertension Mother    Hypertension Father    Diabetes Father     The following portions of the patient's history were reviewed and updated as appropriate: allergies, current medications, past family history, past medical history, past social history, past surgical history and problem list.  Review of Systems Review of Systems - Negative except as mentioned in HPI Review of Systems - General ROS: negative for - chills, fatigue, fever, hot flashes, malaise or night sweats Hematological and Lymphatic ROS: negative for - bleeding problems or swollen lymph nodes Gastrointestinal ROS: negative for - abdominal pain, blood in stools, change in bowel habits and nausea/vomiting Musculoskeletal ROS: negative for - joint pain, muscle pain or muscular weakness Genito-Urinary ROS: negative for - change in menstrual cycle, dysmenorrhea, dyspareunia, dysuria, genital discharge, genital ulcers, hematuria, incontinence, irregular/heavy menses, nocturia or pelvic pain.ositive for increased vaginal discharge an lower left quadrant pain   Objective:   BP 118/83    Pulse 89    Ht 5\' 5"  (1.651 m)    Wt 194 lb 11.2 oz (88.3 kg)    LMP  (LMP Unknown)    BMI 32.40 kg/m  CONSTITUTIONAL: Well-developed, well-nourished female in no acute distress.  HENT:  Normocephalic, atraumatic.  NECK: Normal range of motion, supple, no masses.  Normal thyroid.  SKIN: Skin is warm and dry. No rash noted. Not diaphoretic. No erythema. No pallor. [place]: Alert and oriented to person, place, and time. PSYCHIATRIC: Normal mood and affect. Normal behavior. Normal judgment and thought content. CARDIOVASCULAR:Not Examined RESPIRATORY: Not Examined BREASTS: Not Examined ABDOMEN: Soft, non distended; Non tender.  No Organomegaly. PELVIC:  External Genitalia: Normal  BUS:  Normal  Vagina: Normal  Cervix: Normal  Uterus: Normal size, shape,consistency, mobile  Adnexa: Normal, non tender  RV: Normal   Bladder: Nontender MUSCULOSKELETAL: Normal range of motion. No tenderness.  No cyanosis, clubbing, or edema.     Assessment:   1. Vaginal discharge - Cervicovaginal ancillary only  2. Vaginal discomfort - Cervicovaginal ancillary only     Plan:   Vaginal swab collect  , will follow up with results. She request to be tested for all BV/yeast/Trich/Chlamydia & Gon. Sample boric acid vaginal suppository given to pt with instructions on use. Given pt history ovarian cyst offered u/s to r/o she declines at this time. Will let me know if things change and she would like to proceed. Follow up prn.   Philip Aspen, CNM

## 2021-07-15 ENCOUNTER — Encounter: Payer: Self-pay | Admitting: Certified Nurse Midwife

## 2021-07-15 ENCOUNTER — Other Ambulatory Visit: Payer: Self-pay | Admitting: Certified Nurse Midwife

## 2021-07-15 LAB — CERVICOVAGINAL ANCILLARY ONLY
Bacterial Vaginitis (gardnerella): POSITIVE — AB
Candida Glabrata: NEGATIVE
Candida Vaginitis: POSITIVE — AB
Chlamydia: NEGATIVE
Comment: NEGATIVE
Comment: NEGATIVE
Comment: NEGATIVE
Comment: NEGATIVE
Comment: NEGATIVE
Comment: NORMAL
Neisseria Gonorrhea: NEGATIVE
Trichomonas: POSITIVE — AB

## 2021-07-15 MED ORDER — METRONIDAZOLE 500 MG PO TABS: 500.0000 mg | ORAL_TABLET | Freq: Two times a day (BID) | ORAL | 0 refills | Status: AC

## 2021-07-15 MED ORDER — FLUCONAZOLE 150 MG PO TABS: 150.0000 mg | ORAL_TABLET | Freq: Every day | ORAL | 1 refills | Status: DC

## 2021-08-19 ENCOUNTER — Telehealth: Payer: Self-pay | Admitting: Certified Nurse Midwife

## 2021-08-19 NOTE — Telephone Encounter (Signed)
Pt called requesting that the last medication given to her (she could not confirm the name) [facility] [address].

## 2021-08-21 ENCOUNTER — Other Ambulatory Visit (HOSPITAL_COMMUNITY)
Admission: RE | Admit: 2021-08-21 | Discharge: 2021-08-21 | Disposition: A | Discharge status: Home / Self Care | Payer: 59 | Source: Ambulatory Visit | Attending: Obstetrics | Admitting: Obstetrics

## 2021-08-21 ENCOUNTER — Other Ambulatory Visit: Payer: Self-pay

## 2021-08-21 ENCOUNTER — Ambulatory Visit (INDEPENDENT_AMBULATORY_CARE_PROVIDER_SITE_OTHER): Payer: 59 | Admitting: Obstetrics

## 2021-08-21 ENCOUNTER — Encounter: Payer: Self-pay | Admitting: Obstetrics

## 2021-08-21 VITALS — BP 127/76 | HR 93 | Ht 65.0 in | Wt 196.0 lb

## 2021-08-21 DIAGNOSIS — N898 Other specified noninflammatory disorders of vagina: Secondary | ICD-10-CM | POA: Insufficient documentation

## 2021-08-21 DIAGNOSIS — R399 Unspecified symptoms and signs involving the genitourinary system: Secondary | ICD-10-CM | POA: Diagnosis not present

## 2021-08-21 LAB — POCT URINALYSIS DIPSTICK
Bilirubin, UA: NEGATIVE
Glucose, UA: NEGATIVE
Ketones, UA: NEGATIVE
Nitrite, UA: NEGATIVE
Protein, UA: POSITIVE — AB
Spec Grav, UA: >=1.03 — AB (ref 1.010–1.025)
Urobilinogen, UA: 0.2 E.U./dL
pH, UA: 5 (ref 5.0–8.0)

## 2021-08-21 MED ORDER — NITROFURANTOIN MONOHYD MACRO 100 MG PO CAPS
100.0000 mg | ORAL_CAPSULE | Freq: Two times a day (BID) | ORAL | 0 refills | Status: DC
Start: 2021-08-21 — End: 2021-12-03

## 2021-08-21 NOTE — Progress Notes (Signed)
GYN FOLLOW-UP   SUBJECTIVE HPI: Ashley [name] is a 56 y.o. woman who presents today for follow up after treatment for BV, trichomoniasis, and yeast. She reports that the symptoms have mostly improved, but she still has some vaginal itching. She states that her partner says he was treated for trich, but she is not certain that he actually did. She is also having some urinary frequency, occasional back pain, and suprapubic pain. She denies fever and colicky pain.  OBJECTIVE BP 127/76    Pulse 93    Ht 5\' 5"  (1.651 m)    Wt 196 lb (88.9 kg)    BMI 32.62 kg/m   Urinalysis    Component Value Date/Time   COLORURINE YELLOW (A) 08/12/2017 1237   APPEARANCEUR CLEAR (A) 08/12/2017 1237   LABSPEC 1.012 08/12/2017 1237   PHURINE 6.0 08/12/2017 1237   GLUCOSEU NEGATIVE 08/12/2017 1237   HGBUR LARGE (A) 08/12/2017 1237   BILIRUBINUR neg 08/21/2021 1443   KETONESUR NEGATIVE 08/12/2017 1237   PROTEINUR Positive (A) 08/21/2021 1443   PROTEINUR NEGATIVE 08/12/2017 1237   UROBILINOGEN 0.2 08/21/2021 1443   NITRITE neg 08/21/2021 1443   NITRITE NEGATIVE 08/12/2017 1237   LEUKOCYTESUR Large (3+) (A) 08/21/2021 1443    Physical Exam Cardiovascular: RRR, no murmur Lungs: CTAB Back: No CVA tenderness Abdomen: Tenderness to palpation in suprapubic area, radiating bilaterally  ASSESSMENT 1) Probable UTI 2) Need for TOC for trichomoniasis, BV, yeast  PLAN 1) Urine culture sent. Advised Aleaha that she may start antibiotics while she waits for the results, or she can do comfort measures in the meantime and start the antibiotics once confirmed. Discussed possibility of yeast recurrence with more antibiotics. 2) Swab collected. F/u based on results.  Return to care in one year for annual exam or PRN for concerns.  Lloyd Huger, CNM

## 2021-08-25 LAB — CERVICOVAGINAL ANCILLARY ONLY
Bacterial Vaginitis (gardnerella): NEGATIVE
Candida Glabrata: NEGATIVE
Candida Vaginitis: NEGATIVE
Chlamydia: NEGATIVE
Comment: NEGATIVE
Comment: NEGATIVE
Comment: NEGATIVE
Comment: NEGATIVE
Comment: NEGATIVE
Comment: NORMAL
Neisseria Gonorrhea: NEGATIVE
Trichomonas: NEGATIVE

## 2021-08-26 ENCOUNTER — Encounter: Payer: Self-pay | Admitting: Obstetrics

## 2021-08-26 LAB — URINE CULTURE

## 2021-09-07 ENCOUNTER — Other Ambulatory Visit: Payer: Self-pay | Admitting: *Deleted

## 2021-09-07 MED ORDER — OMEPRAZOLE 40 MG PO CPDR: 40.0000 mg | DELAYED_RELEASE_CAPSULE | Freq: Every day | ORAL | 3 refills | Status: DC

## 2021-09-09 ENCOUNTER — Other Ambulatory Visit: Payer: Self-pay | Admitting: Internal Medicine

## 2021-09-22 ENCOUNTER — Other Ambulatory Visit: Payer: Self-pay | Admitting: *Deleted

## 2021-09-22 MED ORDER — CIPROFLOXACIN HCL 0.3 % OP OINT: TOPICAL_OINTMENT | Freq: Three times a day (TID) | OPHTHALMIC | 0 refills | Status: DC

## 2021-10-15 ENCOUNTER — Ambulatory Visit (INDEPENDENT_AMBULATORY_CARE_PROVIDER_SITE_OTHER): Payer: 59 | Admitting: Obstetrics

## 2021-10-15 ENCOUNTER — Other Ambulatory Visit (HOSPITAL_COMMUNITY)
Admission: RE | Admit: 2021-10-15 | Discharge: 2021-10-15 | Disposition: A | Discharge status: Home / Self Care | Payer: 59 | Source: Ambulatory Visit | Attending: Obstetrics | Admitting: Obstetrics

## 2021-10-15 VITALS — BP 116/77 | HR 88 | Ht 65.0 in | Wt 202.5 lb

## 2021-10-15 DIAGNOSIS — N898 Other specified noninflammatory disorders of vagina: Secondary | ICD-10-CM | POA: Insufficient documentation

## 2021-10-15 NOTE — Progress Notes (Signed)
GYN ENCOUNTER ? ?Encounter for Vaginal Discharge ? ?Subjective ? ?HPI: Ashley [name] is a 56 y.o. G1P1001 who presents today for evaluation of vaginal discharge. She reports having vaginal discharge and itching that started after completing a course of antibiotics for a UTI. She has tried OTC yeast treatments but her symptoms have not resolved. She has had yeast and BV in the past. She would like STI testing today also. ? ?Past Medical History:  ?Diagnosis Date  ? Hypothyroidism   ? Thyroid enlarged   ? pt reports "being watched"  ? ?Past Surgical History:  ?Procedure Laterality Date  ? COLONOSCOPY WITH PROPOFOL N/A 12/26/2015  ? Procedure: COLONOSCOPY WITH PROPOFOL;  Surgeon: Lucilla Lame, MD;  Location: [place];  Service: Endoscopy;  Laterality: N/A;  ? DILATATION & CURETTAGE/HYSTEROSCOPY WITH MYOSURE N/A 02/13/2018  ? Procedure: [place];  Surgeon: Rubie Maid, MD;  Location: [facility];  Service: Gynecology;  Laterality: N/A;  ? EXCISION MASS NECK Right 05/27/2015  ? Procedure: EXCISION MASS NECK RIGHT LIPOMA;  Surgeon: Clyde Canterbury, MD;  Location: [place];  Service: ENT;  Laterality: Right;  ? TONSILLECTOMY    ? TUBAL LIGATION    ? ?OB History   ? ? Gravida  ?1  ? Para  ?1  ? Term  ?1  ? Preterm  ?   ? AB  ?   ? Living  ?1  ?  ? ? SAB  ?   ? IAB  ?   ? Ectopic  ?   ? Multiple  ?   ? Live Births  ?1  ?   ?  ?  ? ?No Known Allergies ? ?Negative except as noted in HPI ?History obtained from the patient ? ?Objective ? ?BP 116/77   Pulse 88   Ht '5\' 5"'$  (1.651 m)   Wt 202 lb 8 oz (91.9 kg)   LMP  (LMP Unknown)   BMI 33.70 kg/m?  ? ?General appearance: alert, fatigued, no distress ?Pelvic: External genitalia normal, cervix normal in appearance, vagina normal with small amount of thick white discharge. Swab collected. ? ?Assessment ?1) Vaginal discharge and itching ? ?Plan ?1) Swab collected. F/u based on results. Discussed  comfort measures for itching. ? ? ?Lloyd Huger, CNM ? ? ?  ?

## 2021-10-16 ENCOUNTER — Other Ambulatory Visit: Payer: Self-pay | Admitting: Obstetrics

## 2021-10-16 ENCOUNTER — Encounter: Payer: Self-pay | Admitting: Obstetrics

## 2021-10-16 LAB — CERVICOVAGINAL ANCILLARY ONLY
Bacterial Vaginitis (gardnerella): POSITIVE — AB
Candida Glabrata: NEGATIVE
Candida Vaginitis: NEGATIVE
Chlamydia: NEGATIVE
Comment: NEGATIVE
Comment: NEGATIVE
Comment: NEGATIVE
Comment: NEGATIVE
Comment: NEGATIVE
Comment: NORMAL
Neisseria Gonorrhea: NEGATIVE
Trichomonas: NEGATIVE

## 2021-10-16 MED ORDER — METRONIDAZOLE 500 MG PO TABS: 500.0000 mg | ORAL_TABLET | Freq: Two times a day (BID) | ORAL | 0 refills | Status: DC

## 2021-10-27 IMAGING — MG DIGITAL SCREENING BILAT W/ TOMO W/ CAD
8 series · 8 of 24 positions shown · non-contrast
Comparison: Previous exam(s).

CLINICAL DATA: Screening.

EXAM:
DIGITAL SCREENING BILATERAL MAMMOGRAM WITH TOMO AND CAD

[L MLO synth-2D]
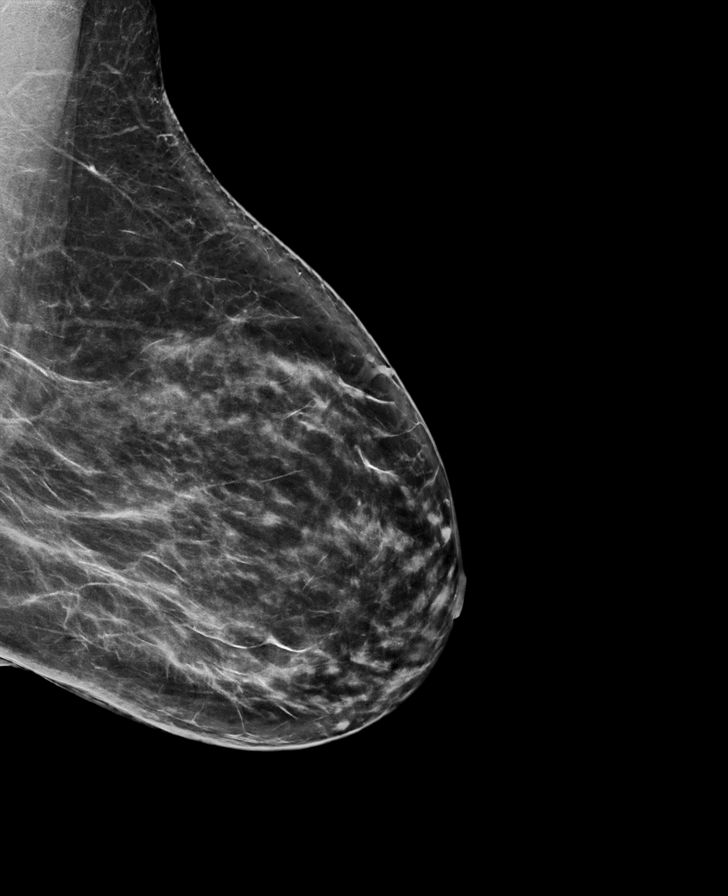

[R CC synth-2D]
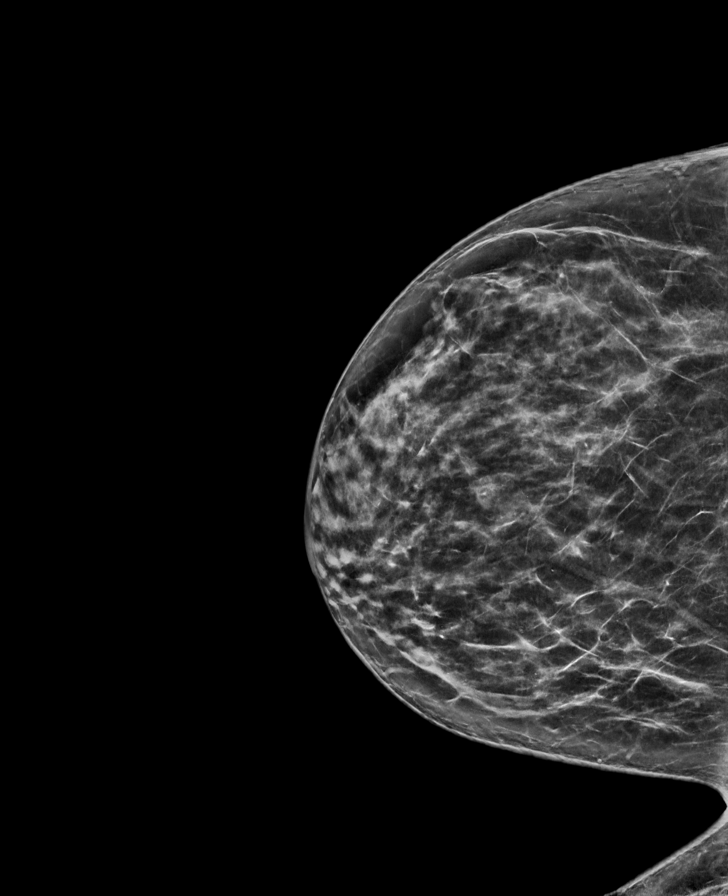

[L CC synth-2D]
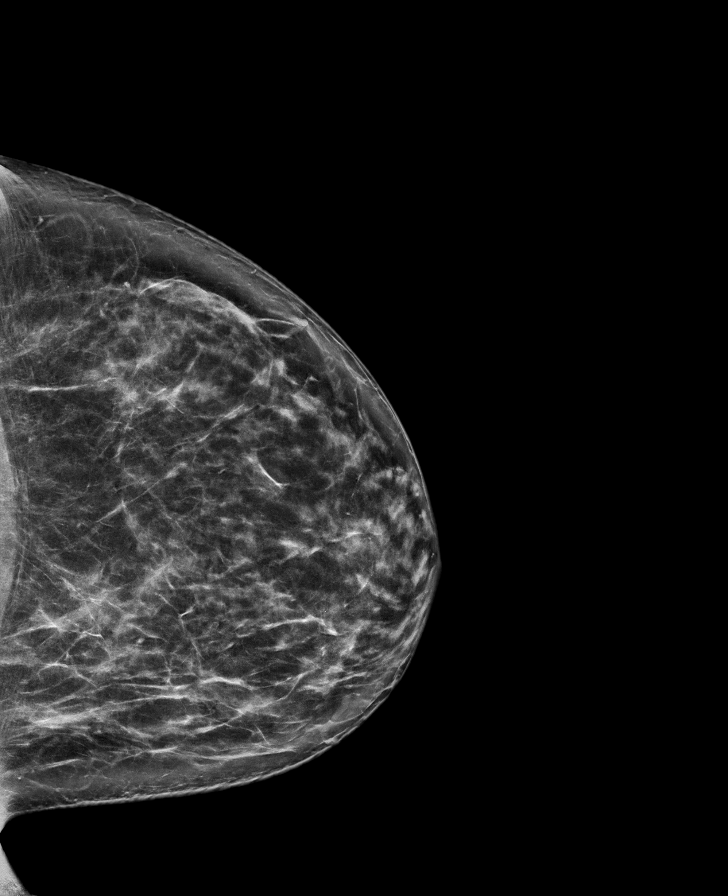

[R MLO synth-2D]
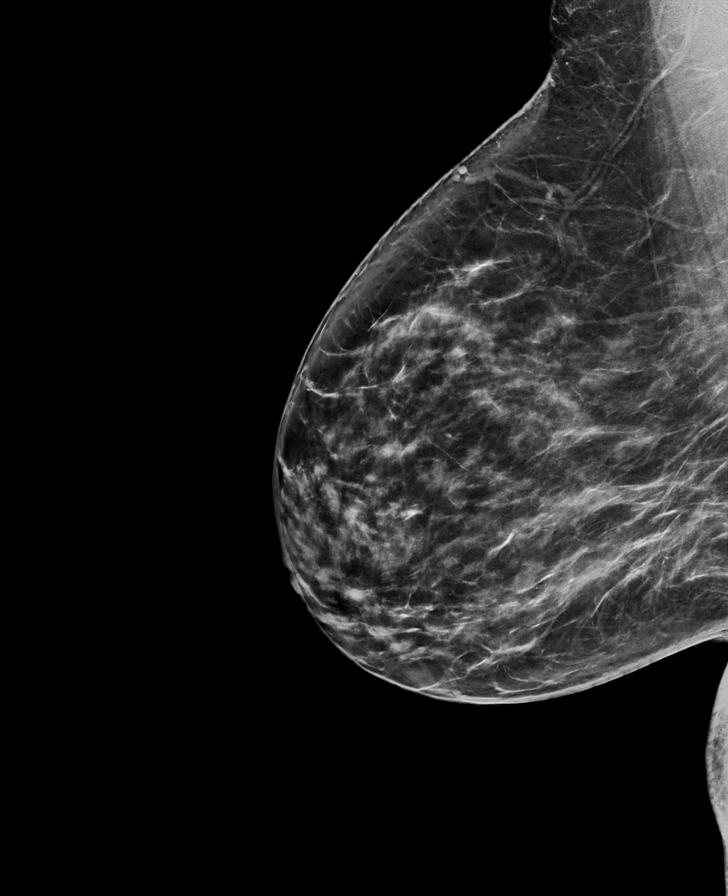

[R MLO tomo · tomo slice 44/87.0]
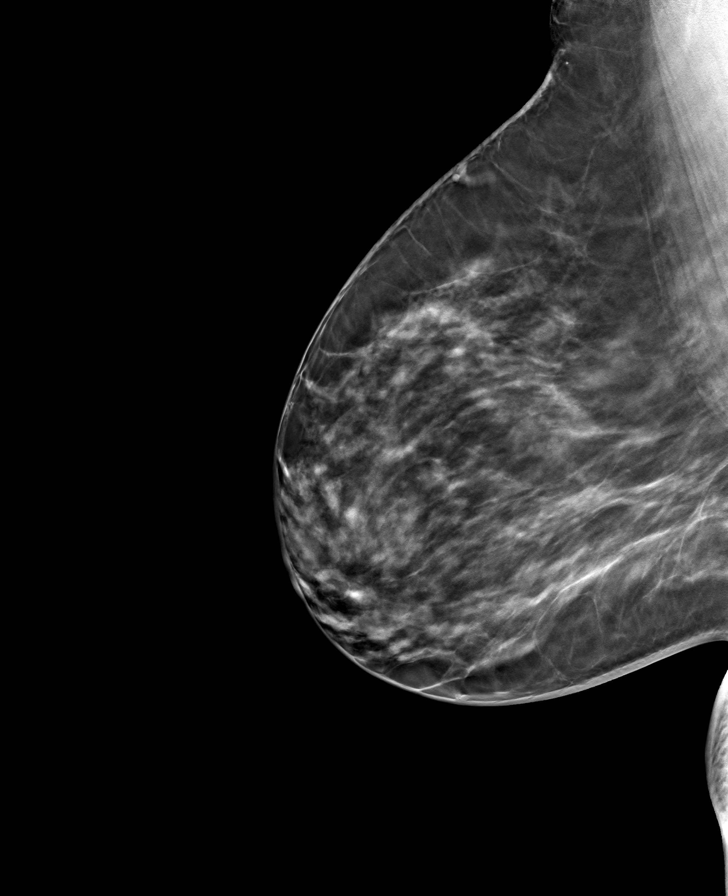

[L MLO tomo · tomo slice 44/87.0]
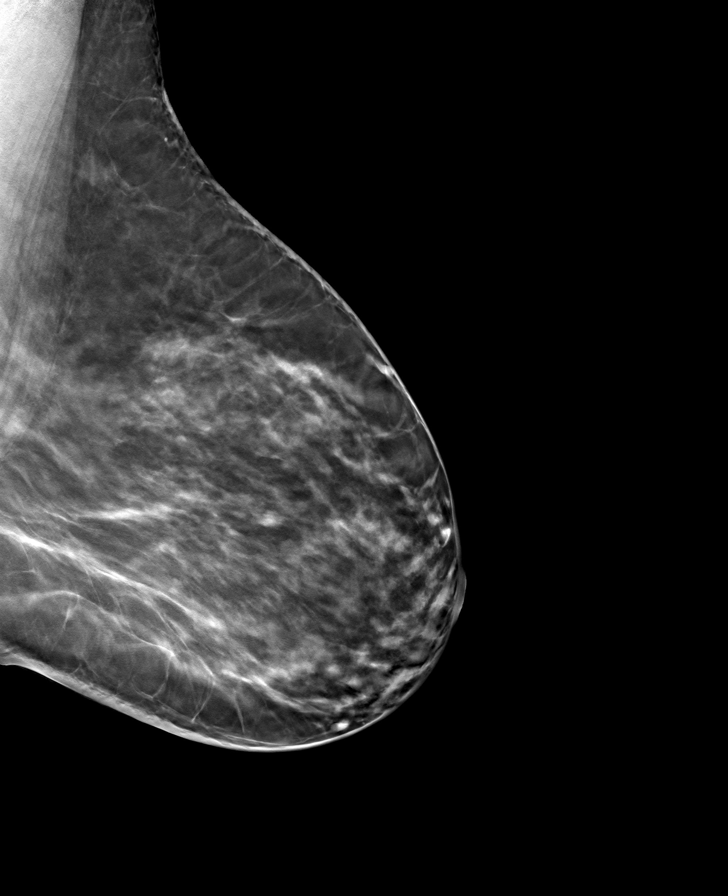

[L CC tomo · tomo slice 43/86.0]
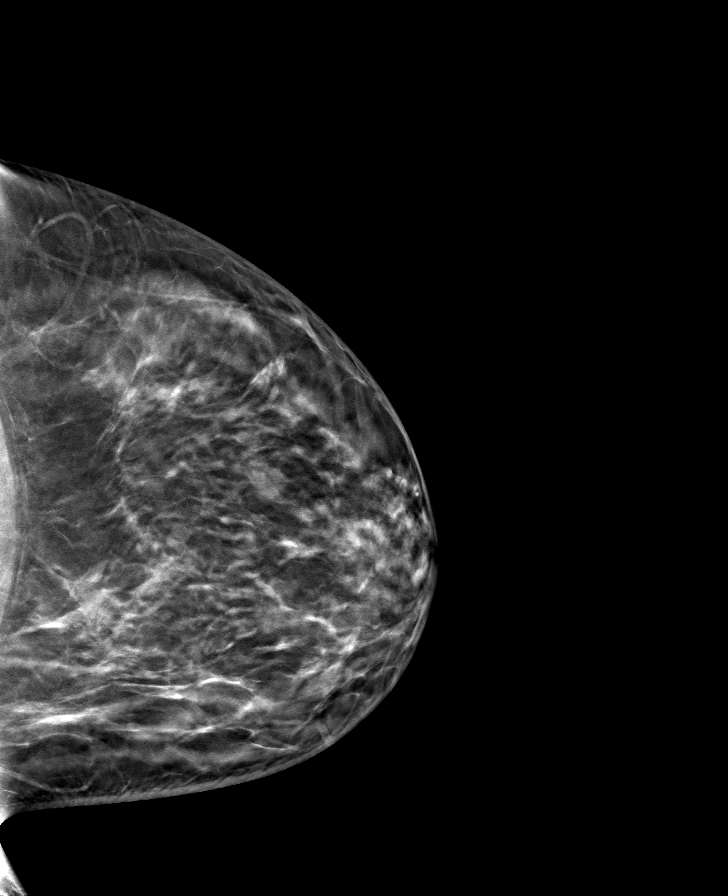

[R CC tomo · tomo slice 40/79.0]
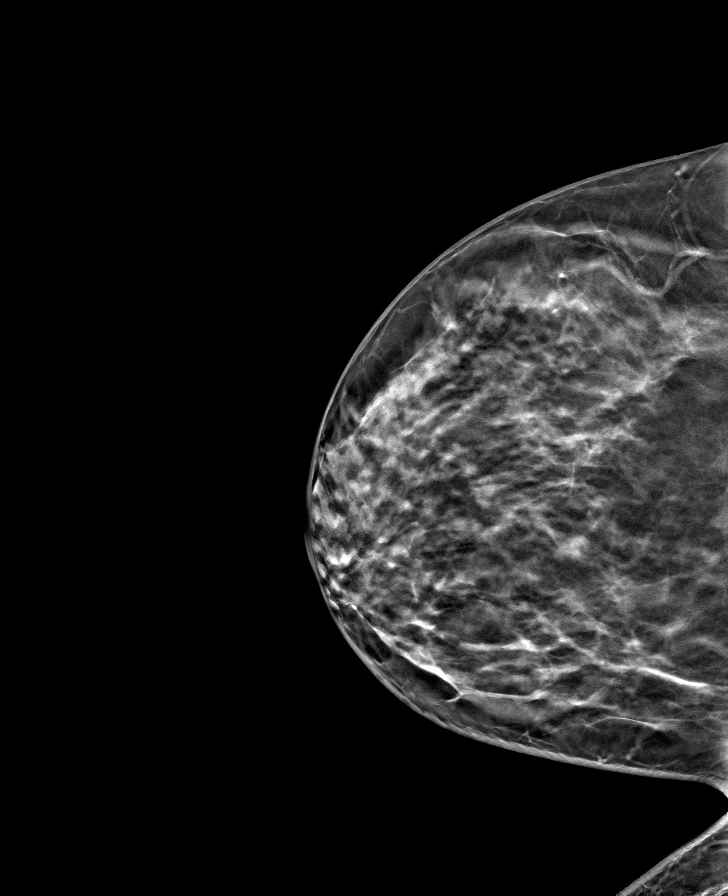

[8 of 24 positions shown; findings below may reference images not displayed]

ACR Breast Density Category c: The breast tissue is heterogeneously
dense, which may obscure small masses.
FINDINGS: There are no findings suspicious for malignancy. Images were
processed with CAD.
IMPRESSION: No mammographic evidence of malignancy. A result letter of this
screening mammogram will be mailed directly to the patient.

RECOMMENDATION:
Screening mammogram in one year. (Code:FT-U-LHB)

BI-RADS CATEGORY  1: Negative.

## 2021-11-12 ENCOUNTER — Other Ambulatory Visit: Payer: Self-pay | Admitting: Internal Medicine

## 2021-11-12 DIAGNOSIS — I10 Essential (primary) hypertension: Secondary | ICD-10-CM

## 2021-12-03 ENCOUNTER — Other Ambulatory Visit (HOSPITAL_COMMUNITY)
Admission: RE | Admit: 2021-12-03 | Discharge: 2021-12-03 | Disposition: A | Discharge status: Home / Self Care | Payer: 59 | Source: Ambulatory Visit | Attending: Obstetrics | Admitting: Obstetrics

## 2021-12-03 ENCOUNTER — Encounter: Payer: Self-pay | Admitting: Obstetrics

## 2021-12-03 ENCOUNTER — Ambulatory Visit (INDEPENDENT_AMBULATORY_CARE_PROVIDER_SITE_OTHER): Payer: 59 | Admitting: Obstetrics

## 2021-12-03 VITALS — BP 135/87 | HR 77 | Ht 65.0 in | Wt 205.9 lb

## 2021-12-03 DIAGNOSIS — Z113 Encounter for screening for infections with a predominantly sexual mode of transmission: Secondary | ICD-10-CM | POA: Diagnosis present

## 2021-12-03 DIAGNOSIS — N8110 Cystocele, unspecified: Secondary | ICD-10-CM

## 2021-12-03 DIAGNOSIS — E038 Other specified hypothyroidism: Secondary | ICD-10-CM

## 2021-12-03 DIAGNOSIS — Z1231 Encounter for screening mammogram for malignant neoplasm of breast: Secondary | ICD-10-CM | POA: Diagnosis not present

## 2021-12-03 MED ORDER — ZOLPIDEM TARTRATE 10 MG PO TABS: 5.0000 mg | ORAL_TABLET | Freq: Every evening | ORAL | 0 refills | Status: DC | PRN

## 2021-12-03 MED ORDER — SERTRALINE HCL 50 MG PO TABS: 50.0000 mg | ORAL_TABLET | Freq: Every day | ORAL | 2 refills | Status: DC

## 2021-12-03 NOTE — Progress Notes (Signed)
SUBJECTIVE  HPI  Ashley [name] is a 56 y.o.-year-old female who presents for an annual gynecological exam today. She is perimenopausal and has not had a period in 9 months. She is struggling with hot flashes that wake her up hourly and disturb her sleep. She reports that her sleep has always been poor, but it is even worse now. She currently works 3rd shift but is switching to days. She briefly tried estrogen patches, but they left bruises on her skin and caused vaginal bleeding. She is interested in trying something else to reduce hot flashes and improve sleep. She denies vaginal bleeding, abnormal discharge, pelvic pain, UTI symptoms, and dyspareunia. She does report some urinary urgency and would like pelvic PT. She is sexually active with one female partner and would like STI screening today.  Medical/Surgical History Past Medical History:  Diagnosis Date   Hypothyroidism    Thyroid enlarged    pt reports "being watched"   Past Surgical History:  Procedure Laterality Date   COLONOSCOPY WITH PROPOFOL N/A 12/26/2015   Procedure: COLONOSCOPY WITH PROPOFOL;  Surgeon: Lucilla Lame, MD;  Location: [facility];  Service: Endoscopy;  Laterality: N/A;   DILATATION & CURETTAGE/HYSTEROSCOPY WITH MYOSURE N/A 02/13/2018   Procedure: DILATATION & CURETTAGE/HYSTEROSCOPY WITH GENESYSN HTA THERMAL ABLATION;  Surgeon: Rubie Maid, MD;  Location: [facility];  Service: Gynecology;  Laterality: N/A;   EXCISION MASS NECK Right 05/27/2015   Procedure: EXCISION MASS NECK RIGHT LIPOMA;  Surgeon: Clyde Canterbury, MD;  Location: [facility];  Service: ENT;  Laterality: Right;   TONSILLECTOMY     TUBAL LIGATION      Social History Lives with daughter and grandkids Works making tubing for pipes Exercise: at work Substances: occasional wine or marijuana; denies tobacco and vape  Obstetric History OB History     Gravida  1   Para  1   Term  1   Preterm  0   AB  0   Living  1       SAB  0   IAB  0   Ectopic  0   Multiple  0   Live Births  1            GYN/Menstrual History No LMP recorded. Patient is perimenopausal. Has not had menses in 9 months Last Pap: 12/30/2017 - normal cytology, negative HPV Contraception: none  Prevention Endorses regular dental and eye exams Mammogram: yearly Colonoscopy: up to date per patient   Current Medications Outpatient Medications Prior to Visit  Medication Sig   ibuprofen (ADVIL) 800 MG tablet TAKE 1 TABLET BY MOUTH EVERY 8 HOURS AS NEEDED FOR MODERATE PAIN.   levothyroxine (SYNTHROID) 50 MCG tablet TAKE 1 TABLET BY MOUTH EVERY DAY BEFORE BREAKFAST   lisinopril-hydrochlorothiazide (ZESTORETIC) 20-12.5 MG tablet TAKE 1 TABLET BY MOUTH  DAILY   omeprazole (PRILOSEC) 40 MG capsule Take 1 capsule (40 mg total) by mouth daily.   rosuvastatin (CRESTOR) 20 MG tablet Take 1 tablet (20 mg total) by mouth daily.   [DISCONTINUED] metroNIDAZOLE (FLAGYL) 500 MG tablet Take 1 tablet (500 mg total) by mouth 2 (two) times daily.   [DISCONTINUED] nitrofurantoin, macrocrystal-monohydrate, (MACROBID) 100 MG capsule Take 1 capsule (100 mg total) by mouth 2 (two) times daily.   [DISCONTINUED] ciprofloxacin (CILOXAN) 0.3 % ophthalmic ointment Place into both eyes 3 (three) times daily. (Patient not taking: Reported on 12/03/2021)   No facility-administered medications prior to visit.     ROS History obtained from the patient  General ROS: positive for  - hot flashes, night sweats, and sleep disturbance negative for - chills, fatigue, fever, or weight loss Psychological ROS: negative for - anxiety or depression Ophthalmic ROS: negative for - decreased vision ENT ROS: negative for - headaches or sore throat Hematological and Lymphatic ROS: negative for - bleeding problems, bruising, or swollen lymph nodes Endocrine ROS: negative for - breast changes, hair pattern changes, mood swings, polydipsia/polyuria, or skin changes Breast ROS:  negative for breast lumps Respiratory ROS: no cough, shortness of breath, or wheezing Cardiovascular ROS: no chest pain or dyspnea on exertion Gastrointestinal ROS: no abdominal pain, change in bowel habits, or black or bloody stools Genito-Urinary ROS: no dysuria, trouble voiding, or hematuria Musculoskeletal ROS: negative Dermatological ROS: negative     02/25/2021    8:46 AM 09/16/2020    9:42 AM  Depression screen PHQ 2/9  Decreased Interest 0 0  Down, Depressed, Hopeless 0 0  PHQ - 2 Score 0 0     OBJECTIVE  Last Weight  Most recent update: 12/03/2021 10:02 AM    Weight  93.4 kg (205 lb 14.4 oz)             Body mass index is 34.26 kg/m.    BP 135/87   Pulse 77   Ht '5\' 5"'$  (1.651 m)   Wt 205 lb 14.4 oz (93.4 kg)   BMI 34.26 kg/m  General appearance: alert, cooperative, and appears stated age Head: Normocephalic, without obvious abnormality, atraumatic Eyes: negative findings: lids and lashes normal and conjunctivae and sclerae normal Neck: no adenopathy, supple, symmetrical, trachea midline, and thyroid not enlarged, symmetric, no tenderness/mass/nodules Lungs: clear to auscultation bilaterally Breasts: normal appearance, no masses or tenderness, Inspection negative, No axillary or supraclavicular adenopathy, Normal to palpation without dominant masses Heart: regular rate and rhythm, S1, S2 normal, no murmur, click, rub or gallop Abdomen: soft, non-tender; bowel sounds normal; no masses,  no organomegaly Pelvic: cervix normal in appearance, external genitalia normal, no adnexal masses or tenderness, no cervical motion tenderness, rectovaginal septum normal, uterus normal size, shape, and consistency, and vagina normal without discharge Extremities: extremities normal, atraumatic, no cyanosis or edema Pulses: 2+ and symmetric Skin: Skin color, texture, turgor normal. No rashes or lesions Lymph nodes: Cervical, supraclavicular, and axillary nodes  normal.  ASSESSMENT  1) Annual exam 2) Perimenopausal symptoms 3) Insomnia  PLAN 1) Physical exam as noted. Pap due in 2024. STI screening done. Mammogram ordered for 05/2022. Requests referral to different endocrinologist. Pelvic PT referral sent.  2) Discussed hormonal and non-hormonal options for symptom management as well as lifestyle changes. Would like to try SSRI. Rx for Zoloft sent.  3) Discussed monitoring sugar intake before bed d/t blood glucose fluctuations contributing to hot flashes and poor sleep. Reviewed valerian root and other options. Would like to try Ambien for short term. Discussed risks and proper usage.  Return in one year for annual exam or as needed for concerns.   Lloyd Huger, CNM

## 2021-12-07 LAB — CERVICOVAGINAL ANCILLARY ONLY
Bacterial Vaginitis (gardnerella): NEGATIVE
Candida Glabrata: NEGATIVE
Candida Vaginitis: NEGATIVE
Chlamydia: NEGATIVE
Comment: NEGATIVE
Comment: NEGATIVE
Comment: NEGATIVE
Comment: NEGATIVE
Comment: NEGATIVE
Comment: NORMAL
Neisseria Gonorrhea: NEGATIVE
Trichomonas: NEGATIVE

## 2021-12-08 ENCOUNTER — Encounter: Payer: Self-pay | Admitting: Obstetrics

## 2021-12-13 ENCOUNTER — Other Ambulatory Visit: Payer: Self-pay | Admitting: Internal Medicine

## 2021-12-16 ENCOUNTER — Other Ambulatory Visit: Payer: Self-pay | Admitting: Internal Medicine

## 2021-12-17 ENCOUNTER — Encounter: Payer: 59 | Admitting: Obstetrics

## 2021-12-26 ENCOUNTER — Other Ambulatory Visit: Payer: Self-pay | Admitting: Obstetrics

## 2022-01-05 ENCOUNTER — Ambulatory Visit: Payer: 59 | Attending: Obstetrics

## 2022-01-05 DIAGNOSIS — R279 Unspecified lack of coordination: Secondary | ICD-10-CM

## 2022-01-05 DIAGNOSIS — M6281 Muscle weakness (generalized): Secondary | ICD-10-CM

## 2022-01-05 DIAGNOSIS — M6289 Other specified disorders of muscle: Secondary | ICD-10-CM | POA: Diagnosis present

## 2022-01-05 DIAGNOSIS — N8110 Cystocele, unspecified: Secondary | ICD-10-CM | POA: Diagnosis not present

## 2022-01-05 NOTE — Therapy (Signed)
OUTPATIENT PHYSICAL THERAPY FEMALE PELVIC EVALUATION   Patient Name: Ashley [name] MRN: [id number] DOB:1965/11/05, 56 y.o., female Today's Date: 01/05/2022   PT End of Session - 01/05/22 0808     Visit Number 1    Number of Visits 12    Date for PT Re-Evaluation 03/30/22    Authorization Type IE: 01/05/22    PT Start Time 0810    PT Stop Time 0843    PT Time Calculation (min) 33 min    Activity Tolerance Patient tolerated treatment well             Past Medical History:  Diagnosis Date   Hypothyroidism    Thyroid enlarged    pt reports "being watched"   Past Surgical History:  Procedure Laterality Date   COLONOSCOPY WITH PROPOFOL N/A 12/26/2015   Procedure: COLONOSCOPY WITH PROPOFOL;  Surgeon: Lucilla Lame, MD;  Location: [facility];  Service: Endoscopy;  Laterality: N/A;   DILATATION & CURETTAGE/HYSTEROSCOPY WITH MYOSURE N/A 02/13/2018   Procedure: DILATATION & CURETTAGE/HYSTEROSCOPY WITH GENESYSN HTA THERMAL ABLATION;  Surgeon: Rubie Maid, MD;  Location: [facility];  Service: Gynecology;  Laterality: N/A;   EXCISION MASS NECK Right 05/27/2015   Procedure: EXCISION MASS NECK RIGHT LIPOMA;  Surgeon: Clyde Canterbury, MD;  Location: [facility];  Service: ENT;  Laterality: Right;   TONSILLECTOMY     TUBAL LIGATION     Patient Active Problem List   Diagnosis Date Noted   Hypothyroidism due to Hashimoto's thyroiditis 06/17/2020   Pure hypercholesterolemia 06/17/2020   Seasonal allergic rhinitis due to pollen 06/17/2020   UTI (urinary tract infection) due to Enterococcus 05/20/2020   History of 2019 novel coronavirus disease (COVID-19) 02/28/2019   Acquired hypothyroidism 02/28/2019   Obesity (BMI 30.0-34.9) 02/28/2019   Essential hypertension 02/28/2019   Menopausal vasomotor syndrome 02/28/2019   S/P endometrial ablation 02/13/2018   Premenstrual symptom 02/06/2016   Menstrual migraine without status migrainosus, not intractable 02/06/2016    Special screening for malignant neoplasms, colon     PCP: Cletis Athens, MD  REFERRING PROVIDER: Lurlean Horns, CNM   REFERRING DIAG:  N81.10 (ICD-10-CM) - Bladder prolapse, female, acquired   THERAPY DIAG:  Unspecified lack of coordination  Muscle weakness (generalized)  Pelvic floor dysfunction  Rationale for Evaluation and Treatment: Rehabilitation  ONSET DATE: Will assess next visit   RED FLAGS: N/A  Have you had any night sweats? Unexplained weight loss? Saddle anesthesia? Unexplained changes in bowel or bladder habits?   SUBJECTIVE: Patient confirms identification and approves PT to assess pelvic floor and treatment Yes  PRECAUTIONS: None  WEIGHT BEARING RESTRICTIONS: No  FALLS:  Has patient fallen in last 6 months? Yes. 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 Pt has to go to the bathroom frequently during the night and day. Pt reports having hot flashes at night but not necessarily getting up in the middle of the night to urinate. Pt feels she gets up in the middle of the night due to hot flashes. Pt does have urinary leakage when walking to the bathroom and sneezing. Pt had 2 episodes of UTI's within the past 6 months and is concerned about that. Pt has occasional B hip pain while lying down, but once the Pt gets up or readjusts she feels better. Pt does have to wake up and readjust from hip pain which disrupts her normal 5 hour sleep schedule since she works 3rd shift. Pt denies N/T down B LEs. Pt has family hx of  hip arthritis.    PAIN:  Are you having pain? No    LIVING ENVIRONMENT: Lives with: lives with their daughter Lives in: House/apartment   PATIENT GOALS: I would like to solve the main problem of my leakage and having to avoid wearing incontinence pads in the future.     UROLOGICAL HISTORY Fluid intake: Yes: water during 3rd shift, sweet tea, cranberry juice/orange juice    Pain with urination: No Fully empty bladder: Yes Stream: strong in beginning, then stop and go Toileting posture: flat feet Urgency: No Frequency: 5-6x Nocturia: 0-1x  Leakage: Walking to the bathroom and Sneezing Pads: No  Bladder control (0-10): 8/10   GASTROINTESTINAL HISTORY Pt has no concerns    SEXUAL HISTORY/FUNCTION Pt has no concerns    OBSTETRICAL HISTORY Vaginal deliveries: G1P1 Tearing: Yes: stitches needed, cannot recall Grade Currently pregnant: No  GYNECOLOGICAL HISTORY Hysterectomy: no, tubal ligation  Pelvic Organ Prolapse: referral says bladder prolapse, but in note from Lloyd Huger, CNM does not say anything about Pt having a bladder prolapse  Heaviness/pressure: no   OBJECTIVE:   DIAGNOSTIC TESTING/IMPRESSIONS:    COGNITION: Overall cognitive status: Within functional limits for tasks assessed     POSTURE:  In sitting, B plantarflexion as Pt is wearing wedged heels. Pt enjoys wearing heels when she does not have to wear work boots.   Lumbar lordosis: Deferred 2/2 time constraints  Thoracic kyphosis: Iliac crest height:  Lumbar lateral shift:  Pelvic obliquity:  Leg length discrepancy:   GAIT: Deferred 2/2 time constraints  Distance walked:  Comments:   Trendelenburg:   SENSATION: Deferred 2/2 time constraints  Light touch: , L2-S2 dermatomes  Proprioception:    RANGE OF MOTION:  Deferred 2/2 time constraints   (Norm range in degrees)  LEFT  RIGHT   Lumbar forward flexion (65):      Lumbar extension (30):     Lumbar lateral flexion (25):      Thoracic and Lumbar rotation (30 degrees):       Hip Flexion (0-125):      Hip IR (0-45):     Hip ER (0-45):     Hip Adduction:      Hip Abduction (0-40):     Hip extension (0-15):     (*= pain,  Blank rows = not tested)   STRENGTH: MMT  Deferred 2/2 time constraints   RLE  LLE   Hip Flexion    Hip Extension    Hip Abduction     Hip Adduction     Hip ER     Hip IR     Knee Extension    Knee Flexion    Dorsiflexion     Plantarflexion (seated)    (*= pain, Blank rows = not tested)   SPECIAL TESTS: Deferred 2/2 time constraints  Centralization and Peripheralization (SN 92, -LR 0.12):  Slump (SN 83, -LR 0.32): R:  L:  SLR (SN 92, -LR 0.29): R:  L:   Lumbar quadrant (SN 70): R:  L:  FABER (SN 81): R:  L:  FADIR (SN 94): R:  L:  Hip scour (SN 50): R:  L:  Thigh Thrust (SN 88, -LR 0.18) : R:  L:  Distraction (YC14): R:  L:  Compression (SN/SP 69): R:  L:  Stork/March (SP 93): R:  L:    PHYSICAL PERFORMANCE MEASURES: Deferred 2/2 time constraints   STS:  RLE SLS:  LLE SLS:  6 MWT:  10MWT:  5TSTS:   PALPATION: Deferred 2/2 time constraints  Abdominal:  Diastasis:  finger above umbilicus,  fingers at and below umbilicus  Scar mobility: present/mobile perpendicular, parallel Rib flare: present/absent  EXTERNAL PELVIC EXAM: Patient educated on the purpose of the pelvic exam and articulated understanding; patient consented to the exam verbally. Deferred 2/2 time constraints  Palpation: Breath coordination: present/absent/inconsistent Voluntary Contraction: present/absent Relaxation: full/delayed/non-relaxing Perineal movement with sustained IAP increase ("bear down"): descent/no change/elevation/excessive descent Perineal movement with rapid IAP increase ("cough"): elevation/no change/descent Pubic symphysis: (0= no contraction, 1= flicker, 2= weak squeeze, 3= fair squeeze with lift, 4= good squeeze and lift against resistance, 5= strong squeeze against strong  resistance)   INTERNAL PELVIC EXAM: Patient educated on the purpose of the pelvic exam and articulated understanding; patient consented to the exam verbally. Not applicable to Pt at this time.  Introitus Appears:  Skin integrity:  Scar mobility: Strength (PERF):  Symmetry: Palpation: Prolapse: (0= no contraction, 1= flicker, 2= weak squeeze, 3= fair squeeze with lift, 4= good squeeze and lift against resistance, 5= strong squeeze against strong resistance)    Patient Education:  Patient educated on what to expect during course of physical therapy, POC, and provided with HEP including: toileting posture and bladder diary to complete. Patient verbalized understanding and returned demonstration. Patient will benefit from further education in order to maximize compliance and understanding for long-term therapeutic gains.   Patient Surveys:  FOTO Urinary Problem- 48    ASSESSMENT:  Clinical Impression: Patient is a 56 y.o. who was seen today for physical therapy evaluation and treatment for a chief concern of urinary leakage. Today's evaluation suggest deficits in IAP management, PFM coordination, PFM endurance, posture, and sleep hygiene as evidenced by urinary leakage with urge/walking to the bathroom/STS transfers/sneezing, B plantarflexion in sitting and wearing of wedged heels frequently (increase PFM tension), B hip pain which wakes her up in the middle of the night in order to readjust (up to 5 hours of sleep), and ingestion of bladder irritants. Patient's responses on FOTO Urinary Problem (48) indicates moderate limitation/disability/distress. Pt with basic understanding of PFM function in bowel/bladder habits, the deep core, breathing mechanics, sexual function, and posture. Patient will benefit from skilled therapeutic intervention to address deficits in IAP management, PFM coordination, PFM endurance, posture, and sleep hygiene in order  to increase PLOF and improve overall QOL.     Objective Impairments: decreased coordination, decreased endurance, decreased strength, improper body mechanics, postural dysfunction, and pain.   Activity Limitations: bending, sitting, standing, sleeping, continence, toileting, and locomotion level  Personal Factors: Age, Behavior pattern, Time since onset of injury/illness/exacerbation, and 1-2 comorbidities: HTN and hypothyroidism  are also affecting patient's functional outcome.   Rehab Potential: Good  Clinical Decision Making: Evolving/moderate complexity  Evaluation Complexity: Moderate   GOALS: Goals reviewed with patient? Yes  SHORT TERM GOALS: Target date: 02/16/2022  Patient will decrease intake of caffeinated beverages and other bladder irritants to less than/= 3 cups in order to manage urinary frequency and bladder irritation for improved overall QOL and participation at home and in the community. Baseline: drinks sweet tea at work as well as cranberry/orange juice, water in between Goal status: INITIAL    LONG TERM GOALS: Target date: 03/30/2022   Patient will report improved ability to control bladder via a decrease in "just in case" trips to the bathroom as indicated by a 24 hour period in order to demonstrate improved behavior pattern and allow for increased participation in activities outside of the home without limitation or disruption.  Baseline: "just in case" trip anytime during breaks at work or before leaving the house even though she does not have the urge  Goal status: INITIAL  2.  Patient will demonstrate independent and coordinated diaphragmatic breathing in supine with a 1:2 breathing pattern for improved down-regulation of the nervous system and improved management of intra-abdominal pressures in order to increase function at home and in the community. Baseline: will assess next visit  Goal status: INITIAL  3.  Patient will report less than 5 incidents of stress urinary incontinence over the course  of 3 weeks while sneezing/STS transfers in order to demonstrate improved PFM coordination, strength, and function for improved overall QOL. Baseline: every time she sneezes or from sit to stand when walking to the bathroom especially if she has an urge Goal status: INITIAL  4.  Patient will score  >/= 62 on FOTO Urinary Problem  in order to demonstrate decreased pain, improved PFM coordination, improved breathing mechanics and overall QOL.  Baseline: 48 Goal status: INITIAL  5.  Patient will be independent in the performance of a HEP in order to participate fully, without limitation or disruptions, in activities at home and in the community.  Baseline: toileting posture handout Goal status: INITIAL    PLAN: PT Frequency: 1x/week  PT Duration: 12 weeks  Planned Interventions: Therapeutic exercises, Therapeutic activity, Neuromuscular re-education, Balance training, Gait training, Patient/Family education, Joint mobilization, Spinal mobilization, Cryotherapy, Moist heat, scar mobilization, Taping, and Manual therapy  Plan For Next Session: phys ass, go over bladder diary    Deira Shimer, PT, DPT  01/05/2022, 2:36 PM

## 2022-01-13 ENCOUNTER — Ambulatory Visit: Payer: 59

## 2022-01-13 DIAGNOSIS — R279 Unspecified lack of coordination: Secondary | ICD-10-CM | POA: Diagnosis not present

## 2022-01-13 DIAGNOSIS — M6289 Other specified disorders of muscle: Secondary | ICD-10-CM

## 2022-01-13 DIAGNOSIS — M6281 Muscle weakness (generalized): Secondary | ICD-10-CM

## 2022-01-13 NOTE — Therapy (Signed)
OUTPATIENT PHYSICAL THERAPY FEMALE PELVIC TREATMENT   Patient Name: Ashley [name] MRN: [id number] DOB:03-21-1966, 56 y.o., female Today's Date: 01/13/2022   PT End of Session - 01/13/22 0929     Visit Number 2    Number of Visits 12    Date for PT Re-Evaluation 03/30/22    PT Start Time 0930    PT Stop Time 1010    PT Time Calculation (min) 40 min    Activity Tolerance Patient tolerated treatment well             Past Medical History:  Diagnosis Date   Hypothyroidism    Thyroid enlarged    pt reports "being watched"   Past Surgical History:  Procedure Laterality Date   COLONOSCOPY WITH PROPOFOL N/A 12/26/2015   Procedure: COLONOSCOPY WITH PROPOFOL;  Surgeon: Lucilla Lame, MD;  Location: [facility];  Service: Endoscopy;  Laterality: N/A;   DILATATION & CURETTAGE/HYSTEROSCOPY WITH MYOSURE N/A 02/13/2018   Procedure: DILATATION & CURETTAGE/HYSTEROSCOPY WITH GENESYSN HTA THERMAL ABLATION;  Surgeon: Rubie Maid, MD;  Location: [facility];  Service: Gynecology;  Laterality: N/A;   EXCISION MASS NECK Right 05/27/2015   Procedure: EXCISION MASS NECK RIGHT LIPOMA;  Surgeon: Clyde Canterbury, MD;  Location: [place];  Service: ENT;  Laterality: Right;   TONSILLECTOMY     TUBAL LIGATION     Patient Active Problem List   Diagnosis Date Noted   Hypothyroidism due to Hashimoto's thyroiditis 06/17/2020   Pure hypercholesterolemia 06/17/2020   Seasonal allergic rhinitis due to pollen 06/17/2020   UTI (urinary tract infection) due to Enterococcus 05/20/2020   History of 2019 novel coronavirus disease (COVID-19) 02/28/2019   Acquired hypothyroidism 02/28/2019   Obesity (BMI 30.0-34.9) 02/28/2019   Essential hypertension 02/28/2019   Menopausal vasomotor syndrome 02/28/2019   S/P endometrial ablation 02/13/2018   Premenstrual symptom 02/06/2016   Menstrual migraine without status migrainosus, not intractable 02/06/2016   Special screening for malignant neoplasms,  colon     PCP: Cletis Athens, MD  REFERRING PROVIDER: Lurlean Horns, CNM   REFERRING DIAG:  N81.10 (ICD-10-CM) - Bladder prolapse, female, acquired   THERAPY DIAG:  Unspecified lack of coordination  Muscle weakness (generalized)  Pelvic floor dysfunction  Rationale for Evaluation and Treatment: Rehabilitation  ONSET DATE: 2 months ago                                                                                                                                                                                          PRECAUTIONS: None  WEIGHT BEARING RESTRICTIONS: No  FALLS:  Has patient fallen in last 6 months? Yes. 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 Pt has to go to the bathroom frequently during the night and day. Pt reports having hot flashes at night but not necessarily getting up in the middle of the night to urinate. Pt feels she gets up in the middle of the night due to hot flashes. Pt does have urinary leakage when walking to the bathroom and sneezing. Pt had 2 episodes of UTI's within the past 6 months and is concerned about that. Pt has occasional B hip pain while lying down, but once the Pt gets up or readjusts she feels better. Pt does have to wake up and readjust from hip pain which disrupts her normal 5 hour sleep schedule since she works 3rd shift. Pt denies N/T down B LEs. Pt has family hx of hip arthritis.    LIVING ENVIRONMENT: Lives with: lives with their daughter Lives in: House/apartment   PATIENT GOALS: I would like to solve the main problem of my leakage and having to avoid wearing incontinence pads in the future.     UROLOGICAL HISTORY Fluid intake: Yes: water during 3rd  shift, sweet tea, cranberry juice/orange juice    Pain with urination: No Fully empty bladder: Yes Stream: strong in beginning, then stop and go Toileting posture: flat feet Urgency: No Frequency: 5-6x Nocturia: 0-1x  Leakage: Walking to the bathroom and Sneezing Pads: No  Bladder control (0-10): 8/10    OBSTETRICAL HISTORY Vaginal deliveries: G1P1 Tearing: Yes: stitches needed, cannot recall Grade Currently pregnant: No  GYNECOLOGICAL HISTORY Hysterectomy: no, tubal ligation  Pelvic Organ Prolapse: referral says bladder prolapse, but in note from Lloyd Huger, CNM does not say anything about Pt having a bladder prolapse  Heaviness/pressure: no  SUBJECTIVE: Pt has no changes from last visit.    PAIN:  Are you having pain? No    TODAY'S TREATMENT   Neuromuscular Re-education:  Pre-treatment assessment   OBJECTIVE:   DIAGNOSTIC TESTING/IMPRESSIONS:    COGNITION: Overall cognitive status: Within functional limits for tasks assessed     POSTURE:  In sitting, B plantarflexion as Pt is wearing wedged heels. Pt enjoys wearing heels when she does not have to wear work boots.   Lumbar lordosis: increased in standing  Iliac crest height: WNL Pelvic obliquity: WNL   RANGE OF MOTION:    (Norm range in degrees)  LEFT 01/13/22 RIGHT 01/13/22  Lumbar forward flexion (65):  WNL    Lumbar extension (30): WNL    Lumbar lateral flexion (25):  WNL WNL  Thoracic and Lumbar rotation (30 degrees):    WNL WNL  Hip Flexion (0-125):   WNL WNL  Hip IR (0-45):  Restricted WNL  Hip ER (0-45):  Restricted WNL  Hip Adduction:      Hip Abduction (0-40):  WNL WNL  Hip extension (0-15):     (*= pain, Blank rows = not tested)   STRENGTH: MMT    RLE 01/13/22 LLE 01/13/22  Hip Flexion 4 4  Hip Extension 4 4  Hip Abduction     Hip Adduction     Hip ER  5 5  Hip IR  5 5  Knee Extension 5 5  Knee Flexion 4 4  Dorsiflexion  Plantarflexion (seated) 5 5  (*= pain, Blank  rows = not tested)   SPECIAL TESTS:   FABER (SN 81): negative B FADIR (SN 94): negative B    PALPATION:  Abdominal:  Diastasis: none, during assessment Valsalva maneuver observed   EXTERNAL PELVIC EXAM: Patient educated on the purpose of the pelvic exam and articulated understanding; patient consented to the exam verbally.  Breath coordination: present but inconsistent Voluntary Contraction: present, 3/5 MMT Relaxation: delayed Perineal movement with sustained IAP increase ("bear down"): minimal descent, observed Valsalva  Perineal movement with rapid IAP increase ("cough"): descent (0= no contraction, 1= flicker, 2= weak squeeze, 3= fair squeeze with lift, 4= good squeeze and lift against resistance, 5= strong squeeze against strong resistance)    Neuromuscular Re-education: Brief discussion on postural techniques at work as Pt does have occasional back pain and demonstrated a flexed spine with B knee extension when asked to demonstrate 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 Will further address variety of postural techniques in further session for improved postural stability.   Supine hooklying diaphragmatic breathing with VCs and TCs for downregulation of the nervous system and improved management of IAP  [facility] abdominal rehab   Supine hooklying TrA contraction with coordinated exhale   Discussion and demonstration on log roll technique for improved IAP management    Patient response to interventions: Pt able to feel subtle lower abdominal tightening during TrA activation   Patient Education:  Patient provided with HEP including: supine diaphragmatic breathing and supine TrA contraction. Patient verbalized understanding and returned demonstration. Patient educated throughout session on appropriate technique and form using multi-modal cueing, HEP, and activity modification. Patient will benefit from further education in order to maximize compliance and understanding  for long-term therapeutic gains.    ASSESSMENT:  Clinical Impression: Patient presents to clinic with excellent motivation to participate in today's session. Upon physical examination, Pt demonstrates  deficits in IAP management, PFM coordination, PFM strength, LE strength, ROM, posture, and pain as evidenced by increased B plantarflexion (wearing wedged heels), increased lumbar lordosis in standing, LBP at work most days, left IR/ER ROM limited, B LE 4/5 MMT in hip flexion/extension/knee flexion, present but inconsistent breath coordination, 3/5 MMT PFM with Valsalva maneuver, delayed PFM relaxation, minimal descent of PFM during sustained IAP increase with observed Valsalva, and descent of PFM with rapid increase in IAP (cough). Pt required moderate VCs and TCs for proper technique and decreased bodily compensations with TrA activation. Pt responded well to all active and educational interventions. Patient will continue to benefit from skilled therapeutic intervention to address deficits in IAP management, PFM coordination, PFM strength, LE strength, ROM, posture, and pain in order to increase PLOF and improve overall QOL.    Objective Impairments: decreased coordination, decreased endurance, decreased strength, improper body mechanics, postural dysfunction, and pain.   Activity Limitations: bending, sitting, standing, sleeping, continence, toileting, and locomotion level  Personal Factors: Age, Behavior pattern, Time since onset of injury/illness/exacerbation, and 1-2 comorbidities: HTN and hypothyroidism  are also affecting patient's functional outcome.   Rehab Potential: Good  Clinical Decision Making: Evolving/moderate complexity  Evaluation Complexity: Moderate   GOALS: Goals reviewed with patient? Yes  SHORT TERM GOALS: Target date: 02/24/2022  Patient will decrease intake of caffeinated beverages and other bladder irritants to less than/= 3 cups in order to manage urinary  frequency and bladder irritation for improved overall QOL and participation at home and in the community. Baseline: drinks sweet tea at work as well as cranberry/orange juice, water  in between Goal status: INITIAL    LONG TERM GOALS: Target date: 04/07/2022   Patient will report improved ability to control bladder via a decrease in "just in case" trips to the bathroom as indicated by a 24 hour period in order to demonstrate improved behavior pattern and allow for increased participation in activities outside of the home without limitation or disruption.  Baseline: "just in case" trip anytime during breaks at work or before leaving the house even though she does not have the urge  Goal status: INITIAL  2.  Patient will demonstrate independent and coordinated diaphragmatic breathing in supine with a 1:2 breathing pattern for improved down-regulation of the nervous system and improved management of intra-abdominal pressures in order to increase function at home and in the community. Baseline: present but inconsistent/delayed relaxation of PFM  Goal status: INITIAL  3.  Patient will report less than 5 incidents of stress urinary incontinence over the course of 3 weeks while sneezing/STS transfers in order to demonstrate improved PFM coordination, strength, and function for improved overall QOL. Baseline: every time she sneezes or from sit to stand when walking to the bathroom especially if she has an urge Goal status: INITIAL  4.  Patient will score  >/= 62 on FOTO Urinary Problem  in order to demonstrate decreased pain, improved PFM coordination, improved breathing mechanics and overall QOL.  Baseline: 48 Goal status: INITIAL  5.  Patient will be independent in the performance of a HEP in order to participate fully, without limitation or disruptions, in activities at home and in the community.  Baseline: toileting posture handout Goal status: INITIAL    PLAN: PT Frequency: 1x/week  PT  Duration: 12 weeks  Planned Interventions: Therapeutic exercises, Therapeutic activity, Neuromuscular re-education, Balance training, Gait training, Patient/Family education, Joint mobilization, Spinal mobilization, Cryotherapy, Moist heat, scar mobilization, Taping, and Manual therapy  Plan For Next Session: go over bladder diary, bladder irritants sheet, continue deep core, lifting mechanics?   Sharica Roedel, PT, DPT  01/13/2022, 10:12 AM

## 2022-01-19 ENCOUNTER — Ambulatory Visit: Payer: 59

## 2022-01-19 ENCOUNTER — Ambulatory Visit (INDEPENDENT_AMBULATORY_CARE_PROVIDER_SITE_OTHER): Payer: 59 | Admitting: Internal Medicine

## 2022-01-19 ENCOUNTER — Encounter: Payer: Self-pay | Admitting: Internal Medicine

## 2022-01-19 VITALS — BP 124/85 | HR 81 | Ht 65.0 in | Wt 202.1 lb

## 2022-01-19 DIAGNOSIS — R279 Unspecified lack of coordination: Secondary | ICD-10-CM

## 2022-01-19 DIAGNOSIS — E669 Obesity, unspecified: Secondary | ICD-10-CM | POA: Diagnosis not present

## 2022-01-19 DIAGNOSIS — G43829 Menstrual migraine, not intractable, without status migrainosus: Secondary | ICD-10-CM

## 2022-01-19 DIAGNOSIS — I10 Essential (primary) hypertension: Secondary | ICD-10-CM

## 2022-01-19 DIAGNOSIS — M6281 Muscle weakness (generalized): Secondary | ICD-10-CM

## 2022-01-19 DIAGNOSIS — E7800 Pure hypercholesterolemia, unspecified: Secondary | ICD-10-CM

## 2022-01-19 DIAGNOSIS — J301 Allergic rhinitis due to pollen: Secondary | ICD-10-CM | POA: Diagnosis not present

## 2022-01-19 DIAGNOSIS — M6289 Other specified disorders of muscle: Secondary | ICD-10-CM

## 2022-01-19 NOTE — Assessment & Plan Note (Signed)
Okay, patient denies any chest pain or shortness of breath there is no history of palpitation or paroxysmal nocturnal dyspnea   patient was advised to follow low-salt low-cholesterol diet    ideally I want to keep systolic blood pressure below 130 mmHg, patient was asked to check blood pressure one times a week and give me a report on that.  Patient will be follow-up in 3 months  or earlier as needed, patient will call me back for any change in the cardiovascular symptoms Patient was advised to buy a book from local bookstore concerning blood pressure and read several chapters  every day.  This will be supplemented by some of the material we will give him from the office.  Patient should also utilize other resources like YouTube and Internet to learn more about the blood pressure and the diet.

## 2022-01-19 NOTE — Assessment & Plan Note (Signed)

## 2022-01-19 NOTE — Assessment & Plan Note (Signed)
Take Claritin 10 mg p.o. daily 

## 2022-01-19 NOTE — Assessment & Plan Note (Signed)
Hypercholesterolemia  I advised the patient to follow Mediterranean diet This diet is rich in fruits vegetables and whole grain, and This diet is also rich in fish and lean meat Patient should also eat a handful of almonds or walnuts daily Recent heart study indicated that average follow-up on this kind of diet reduces the cardiovascular mortality by 50 to 70%== 

## 2022-01-19 NOTE — Progress Notes (Signed)
Established Patient Office Visit  Subjective:  Patient ID: Ashley [name], female    DOB: 22-Jun-1966  Age: 56 y.o. MRN: [id number]  CC:  Chief Complaint  Patient presents with   Cough    Patient reports coughing since Thursday, the cough is dry and not productive.    Cough Pertinent negatives include no chills or postnasal drip.    Ashley [name] presents for cough and cold  Past Medical History:  Diagnosis Date   Hypothyroidism    Thyroid enlarged    pt reports "being watched"    Past Surgical History:  Procedure Laterality Date   COLONOSCOPY WITH PROPOFOL N/A 12/26/2015   Procedure: COLONOSCOPY WITH PROPOFOL;  Surgeon: Lucilla Lame, MD;  Location: [place];  Service: Endoscopy;  Laterality: N/A;   DILATATION & CURETTAGE/HYSTEROSCOPY WITH MYOSURE N/A 02/13/2018   Procedure: DILATATION & CURETTAGE/HYSTEROSCOPY WITH GENESYSN HTA THERMAL ABLATION;  Surgeon: Rubie Maid, MD;  Location: [facility];  Service: Gynecology;  Laterality: N/A;   EXCISION MASS NECK Right 05/27/2015   Procedure: EXCISION MASS NECK RIGHT LIPOMA;  Surgeon: Clyde Canterbury, MD;  Location: [place];  Service: ENT;  Laterality: Right;   TONSILLECTOMY     TUBAL LIGATION      Family History  Problem Relation Age of Onset   Hypertension Mother    Hypertension Father    Diabetes Father     Social History   Socioeconomic History   Marital status: Single    Spouse name: Not on file   Number of children: Not on file   Years of education: Not on file   Highest education level: Not on file  Occupational History   Not on file  Tobacco Use   Smoking status: Never   Smokeless tobacco: Never  Vaping Use   Vaping Use: Never used  Substance and Sexual Activity   Alcohol use: Yes    Comment: OCC   Drug use: No   Sexual activity: Yes    Birth control/protection: Surgical  Other Topics Concern   Not on file  Social History Narrative   Not on file   Social Determinants of  Health   Financial Resource Strain: Not on file  Food Insecurity: Not on file  Transportation Needs: Not on file  Physical Activity: Not on file  Stress: Not on file  Social Connections: Not on file  Intimate Partner Violence: Not on file     Current Outpatient Medications:    ibuprofen (ADVIL) 800 MG tablet, TAKE 1 TABLET BY MOUTH EVERY 8 HOURS AS NEEDED FOR MODERATE PAIN., Disp: 60 tablet, Rfl: 1   lisinopril-hydrochlorothiazide (ZESTORETIC) 20-12.5 MG tablet, TAKE 1 TABLET BY MOUTH  DAILY, Disp: 90 tablet, Rfl: 3   omeprazole (PRILOSEC) 40 MG capsule, Take 1 capsule (40 mg total) by mouth daily., Disp: 90 capsule, Rfl: 3   rosuvastatin (CRESTOR) 20 MG tablet, Take 1 tablet (20 mg total) by mouth daily., Disp: 90 tablet, Rfl: 3   SYNTHROID 50 MCG tablet, TAKE 1 TABLET DAILY BEFORE BREAKFAST, Disp: 90 tablet, Rfl: 3   zolpidem (AMBIEN) 10 MG tablet, Take 0.5 tablets (5 mg total) by mouth at bedtime as needed for up to 15 days for sleep., Disp: 7.5 tablet, Rfl: 0   No Known Allergies  ROS Review of Systems  Constitutional: Negative.  Negative for chills and diaphoresis.  HENT:  Positive for congestion. Negative for mouth sores and postnasal drip.   Eyes: Negative.  Negative for photophobia.  Respiratory:  Positive for cough.   Cardiovascular: Negative.   Gastrointestinal: Negative.   Endocrine: Negative.   Genitourinary: Negative.  Negative for enuresis.  Musculoskeletal: Negative.   Skin: Negative.   Allergic/Immunologic: Negative.   Neurological: Negative.  Negative for numbness.  Hematological: Negative.   Psychiatric/Behavioral: Negative.    All other systems reviewed and are negative.     Objective:    Physical Exam Vitals reviewed.  Constitutional:      Appearance: Normal appearance.  HENT:     Head: Atraumatic.     Right Ear: Ear canal normal.     Left Ear: Ear canal normal.     Nose: Nose normal.     Mouth/Throat:     Mouth: Mucous membranes are moist.      Pharynx: No oropharyngeal exudate or posterior oropharyngeal erythema.  Eyes:     Conjunctiva/sclera: Conjunctivae normal.     Pupils: Pupils are equal, round, and reactive to light.  Neck:     Vascular: No carotid bruit.  Cardiovascular:     Rate and Rhythm: Normal rate and regular rhythm.     Pulses: Normal pulses.     Heart sounds: Normal heart sounds.  Pulmonary:     Effort: Pulmonary effort is normal.     Breath sounds: Normal breath sounds.  Abdominal:     General: Bowel sounds are normal.     Palpations: Abdomen is soft. There is no hepatomegaly, splenomegaly or mass.     Tenderness: There is no abdominal tenderness.     Hernia: No hernia is present.  Musculoskeletal:        General: No tenderness.     Cervical back: Neck supple.     Right lower leg: No edema.     Left lower leg: No edema.  Skin:    Findings: No rash.  Neurological:     Mental Status: She is alert and oriented to person, place, and time.     Motor: No weakness.  Psychiatric:        Mood and Affect: Mood and affect normal.        Behavior: Behavior normal.     BP 124/85   Pulse 81   Ht '5\' 5"'$  (1.651 m)   Wt 202 lb 1.6 oz (91.7 kg)   BMI 33.63 kg/m  Wt Readings from Last 3 Encounters:  01/19/22 202 lb 1.6 oz (91.7 kg)  12/03/21 205 lb 14.4 oz (93.4 kg)  10/15/21 202 lb 8 oz (91.9 kg)     Health Maintenance Due  Topic Date Due   Hepatitis C Screening  Never done   TETANUS/TDAP  Never done   COVID-19 Vaccine (3 - Moderna series) 12/10/2019   Zoster Vaccines- Shingrix (2 of 2) 05/08/2020    There are no preventive care reminders to display for this patient.  Lab Results  Component Value Date   TSH 1.350 12/30/2017   Lab Results  Component Value Date   WBC 4.6 04/30/2020   HGB 12.6 04/30/2020   HCT 37.0 04/30/2020   MCV 91 04/30/2020   PLT 284 04/30/2020   Lab Results  Component Value Date   NA 140 04/30/2020   K 4.5 04/30/2020   CO2 25 04/30/2020   GLUCOSE 89 04/30/2020    BUN 12 04/30/2020   CREATININE 0.93 04/30/2020   BILITOT <0.2 04/30/2020   ALKPHOS 87 04/30/2020   AST 18 04/30/2020   ALT 13 04/30/2020   PROT 7.4 04/30/2020   ALBUMIN 4.6 04/30/2020  CALCIUM 9.2 04/30/2020   ANIONGAP 11 08/12/2017   Lab Results  Component Value Date   CHOL 303 (H) 04/30/2020   Lab Results  Component Value Date   HDL 133 04/30/2020   Lab Results  Component Value Date   LDLCALC 162 (H) 04/30/2020   Lab Results  Component Value Date   TRIG 56 04/30/2020   Lab Results  Component Value Date   CHOLHDL 2.3 04/30/2020   Lab Results  Component Value Date   HGBA1C 5.9 (H) 04/30/2020      Assessment & Plan:   Problem List Items Addressed This Visit       Cardiovascular and Mediastinum   Menstrual migraine without status migrainosus, not intractable    Stable at the present time      Essential hypertension - Primary    Okay, patient denies any chest pain or shortness of breath there is no history of palpitation or paroxysmal nocturnal dyspnea   patient was advised to follow low-salt low-cholesterol diet    ideally I want to keep systolic blood pressure below 130 mmHg, patient was asked to check blood pressure one times a week and give me a report on that.  Patient will be follow-up in 3 months  or earlier as needed, patient will call me back for any change in the cardiovascular symptoms Patient was advised to buy a book from local bookstore concerning blood pressure and read several chapters  every day.  This will be supplemented by some of the material we will give him from the office.  Patient should also utilize other resources like YouTube and Internet to learn more about the blood pressure and the diet.        Respiratory   Seasonal allergic rhinitis due to pollen    Take Claritin 10 mg p.o. daily        Other   Obesity (BMI 30.0-34.9)    - I encouraged the patient to lose weight.  - I educated them on making healthy dietary choices  including eating more fruits and vegetables and less fried foods. - I encouraged the patient to exercise more, and educated on the benefits of exercise including weight loss, diabetes prevention, and hypertension prevention.   Dietary counseling with a registered dietician  Referral to a weight management support group (e.g. Weight Watchers, Overeaters Anonymous)  If your BMI is greater than 29 or you have gained more than 15 pounds you should work on weight loss.  Attend a healthy cooking class       Pure hypercholesterolemia    Hypercholesterolemia  I advised the patient to follow Mediterranean diet This diet is rich in fruits vegetables and whole grain, and This diet is also rich in fish and lean meat Patient should also eat a handful of almonds or walnuts daily Recent heart study indicated that average follow-up on this kind of diet reduces the cardiovascular mortality by 50 to 70%==      DASH Diet: Care Instructions Your Care Instructions  The DASH diet is an eating plan that can help lower your blood pressure. DASH stands for Dietary Approaches to Stop Hypertension. Hypertension is high blood pressure. The DASH diet focuses on eating foods that are high in calcium, potassium, and magnesium. These nutrients can lower blood pressure. The foods that are highest in these nutrients are fruits, vegetables, low-fat dairy products, nuts, seeds, and legumes. But taking calcium, potassium, and magnesium supplements instead of eating foods that are high in those nutrients does  not have the same effect. The DASH diet also includes whole grains, fish, and poultry. The DASH diet is one of several lifestyle changes your doctor may recommend to lower your high blood pressure. Your doctor may also want you to decrease the amount of sodium in your diet. Lowering sodium while following the DASH diet can lower blood pressure even further than just the DASH diet alone. Follow-up care is a key part of  your treatment and safety. Be sure to make and go to all appointments, and call your doctor if you are having problems. It's also a good idea to know your test results and keep a list of the medicines you take. How can you care for yourself at home? Following the DASH diet  Eat 4 to 5 servings of fruit each day. A serving is 1 medium-sized piece of fruit,  cup chopped or canned fruit, 1/4 cup dried fruit, or 4 ounces ( cup) of fruit juice. Choose fruit more often than fruit juice.  Eat 4 to 5 servings of vegetables each day. A serving is 1 cup of lettuce or raw leafy vegetables,  cup of chopped or cooked vegetables, or 4 ounces ( cup) of vegetable juice. Choose vegetables more often than vegetable juice.  Get 2 to 3 servings of low-fat and fat-free dairy each day. A serving is 8 ounces of milk, 1 cup of yogurt, or 1  ounces of cheese.  Eat 6 to 8 servings of grains each day. A serving is 1 slice of bread, 1 ounce of dry cereal, or  cup of cooked rice, pasta, or cooked cereal. Try to choose whole-grain products as much as possible.  Limit lean meat, poultry, and fish to 2 servings each day. A serving is 3 ounces, about the size of a deck of cards.  Eat 4 to 5 servings of nuts, seeds, and legumes (cooked dried beans, lentils, and split peas) each week. A serving is 1/3 cup of nuts, 2 tablespoons of seeds, or  cup of cooked beans or peas.  Limit fats and oils to 2 to 3 servings each day. A serving is 1 teaspoon of vegetable oil or 2 tablespoons of salad dressing.  Limit sweets and added sugars to 5 servings or less a week. A serving is 1 tablespoon jelly or jam,  cup sorbet, or 1 cup of lemonade.  Eat less than 2,300 milligrams (mg) of sodium a day. If you limit your sodium to 1,500 mg a day, you can lower your blood pressure even more. Tips for success  Start small. Do not try to make dramatic changes to your diet all at once. You might feel that you are missing out on your favorite  foods and then be more likely to not follow the plan. Make small changes, and stick with them. Once those changes become habit, add a few more changes.  Try some of the following: ? Make it a goal to eat a fruit or vegetable at every meal and at snacks. This will make it easy to get the recommended amount of fruits and vegetables each day. ? Try yogurt topped with fruit and nuts for a snack or healthy dessert. ? Add lettuce, tomato, cucumber, and onion to sandwiches. ? Combine a ready-made pizza crust with low-fat mozzarella cheese and lots of vegetable toppings. Try using tomatoes, squash, spinach, broccoli, carrots, cauliflower, and onions. ? Have a variety of cut-up vegetables with a low-fat dip as an appetizer instead of chips and dip. ?  Sprinkle sunflower seeds or chopped almonds over salads. Or try adding chopped walnuts or almonds to cooked vegetables. ? Try some vegetarian meals using beans and peas. Add garbanzo or kidney beans to salads. Make burritos and tacos with mashed pinto beans or black beans. No orders of the defined types were placed in this encounter.   Follow-up: No follow-ups on file.    Cletis Athens, MD

## 2022-01-19 NOTE — Therapy (Signed)
OUTPATIENT PHYSICAL THERAPY FEMALE PELVIC TREATMENT   Patient Name: Ashley [name] MRN: [id number] DOB:[birth year], 56 y.o., female Today's Date: 01/19/2022   PT End of Session - 01/19/22 0815     Visit Number 3    Number of Visits 12    Date for PT Re-Evaluation 03/30/22    PT Start Time 0800    PT Stop Time 0840    PT Time Calculation (min) 40 min    Activity Tolerance Patient tolerated treatment well             Past Medical History:  Diagnosis Date   Hypothyroidism    Thyroid enlarged    pt reports "being watched"   Past Surgical History:  Procedure Laterality Date   COLONOSCOPY WITH PROPOFOL N/A 12/26/2015   Procedure: COLONOSCOPY WITH PROPOFOL;  Surgeon: Lucilla Lame, MD;  Location: [place];  Service: Endoscopy;  Laterality: N/A;   DILATATION & CURETTAGE/HYSTEROSCOPY WITH MYOSURE N/A 02/13/2018   Procedure: DILATATION & CURETTAGE/HYSTEROSCOPY WITH GENESYSN HTA THERMAL ABLATION;  Surgeon: Rubie Maid, MD;  Location: [facility];  Service: Gynecology;  Laterality: N/A;   EXCISION MASS NECK Right 05/27/2015   Procedure: EXCISION MASS NECK RIGHT LIPOMA;  Surgeon: Clyde Canterbury, MD;  Location: [place];  Service: ENT;  Laterality: Right;   TONSILLECTOMY     TUBAL LIGATION     Patient Active Problem List   Diagnosis Date Noted   Hypothyroidism due to Hashimoto's thyroiditis 06/17/2020   Pure hypercholesterolemia 06/17/2020   Seasonal allergic rhinitis due to pollen 06/17/2020   UTI (urinary tract infection) due to Enterococcus 05/20/2020   History of 2019 novel coronavirus disease (COVID-19) 02/28/2019   Acquired hypothyroidism 02/28/2019   Obesity (BMI 30.0-34.9) 02/28/2019   Essential hypertension 02/28/2019   Menopausal vasomotor syndrome 02/28/2019   S/P endometrial ablation 02/13/2018   Premenstrual symptom 02/06/2016   Menstrual migraine without status migrainosus, not intractable 02/06/2016   Special screening for malignant neoplasms,  colon     PCP: Cletis Athens, MD  REFERRING PROVIDER: Lurlean Horns, CNM   REFERRING DIAG:  N81.10 (ICD-10-CM) - Bladder prolapse, female, acquired   THERAPY DIAG:  Unspecified lack of coordination  Muscle weakness (generalized)  Pelvic floor dysfunction  Rationale for Evaluation and Treatment: Rehabilitation  ONSET DATE: 2 months ago                                                                                                                                                                                          PRECAUTIONS: None  WEIGHT BEARING RESTRICTIONS: No  FALLS:  Has patient fallen in last 6 months? Yes. 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 Pt has to go to the bathroom frequently during the night and day. Pt reports having hot flashes at night but not necessarily getting up in the middle of the night to urinate. Pt feels she gets up in the middle of the night due to hot flashes. Pt does have urinary leakage when walking to the bathroom and sneezing. Pt had 2 episodes of UTI's within the past 6 months and is concerned about that. Pt has occasional B hip pain while lying down, but once the Pt gets up or readjusts she feels better. Pt does have to wake up and readjust from hip pain which disrupts her normal 5 hour sleep schedule since she works 3rd shift. Pt denies N/T down B LEs. Pt has family hx of hip arthritis.    LIVING ENVIRONMENT: Lives with: lives with their daughter Lives in: House/apartment   PATIENT GOALS: I would like to solve the main problem of my leakage and having to avoid wearing incontinence pads in the future.     UROLOGICAL HISTORY Fluid intake: Yes: water during 3rd  shift, sweet tea, cranberry juice/orange juice    Pain with urination: No Fully empty bladder: Yes Stream: strong in beginning, then stop and go Toileting posture: flat feet Urgency: No Frequency: 5-6x Nocturia: 0-1x  Leakage: Walking to the bathroom and Sneezing Pads: No  Bladder control (0-10): 8/10    OBSTETRICAL HISTORY Vaginal deliveries: G1P1 Tearing: Yes: stitches needed, cannot recall Grade Currently pregnant: No  GYNECOLOGICAL HISTORY Hysterectomy: no, tubal ligation  Pelvic Organ Prolapse: referral says bladder prolapse, but in note from Lloyd Huger, CNM does not say anything about Pt having a bladder prolapse  Heaviness/pressure: no  SUBJECTIVE: Pt reports practicing HEP at home and can feel the subtle tightening of the TrA. Pt has noticed decreased urgency/leakage when she gets the signal to go to the bathroom. She doesn't have to run to the bathroom like she has before.    PAIN:  Are you having pain? No     OBJECTIVE:   DIAGNOSTIC TESTING/IMPRESSIONS:    COGNITION: Overall cognitive status: Within functional limits for tasks assessed     POSTURE:  In sitting, B plantarflexion as Pt is wearing wedged heels. Pt enjoys wearing heels when she does not have to wear work boots.   Lumbar lordosis: increased in standing  Iliac crest height: WNL Pelvic obliquity: WNL   RANGE OF MOTION:    (Norm range in degrees)  LEFT 01/13/22 RIGHT 01/13/22  Lumbar forward flexion (65):  WNL    Lumbar extension (30): WNL    Lumbar lateral flexion (25):  WNL WNL  Thoracic and Lumbar rotation (30 degrees):    WNL WNL  Hip Flexion (0-125):   WNL WNL  Hip IR (0-45):  Restricted WNL  Hip ER (0-45):  Restricted WNL  Hip Adduction:      Hip Abduction (0-40):  WNL WNL  Hip extension (0-15):     (*= pain, Blank rows = not tested)   STRENGTH: MMT    RLE 01/13/22 LLE 01/13/22  Hip Flexion 4 4  Hip Extension 4 4  Hip Abduction     Hip Adduction  Hip ER  5 5  Hip  IR  5 5  Knee Extension 5 5  Knee Flexion 4 4  Dorsiflexion     Plantarflexion (seated) 5 5  (*= pain, Blank rows = not tested)   SPECIAL TESTS:   FABER (SN 81): negative B FADIR (SN 94): negative B    PALPATION:  Abdominal:  Diastasis: none, during assessment Valsalva maneuver observed   EXTERNAL PELVIC EXAM: Patient educated on the purpose of the pelvic exam and articulated understanding; patient consented to the exam verbally.  Breath coordination: present but inconsistent Voluntary Contraction: present, 3/5 MMT Relaxation: delayed Perineal movement with sustained IAP increase ("bear down"): minimal descent, observed Valsalva  Perineal movement with rapid IAP increase ("cough"): descent (0= no contraction, 1= flicker, 2= weak squeeze, 3= fair squeeze with lift, 4= good squeeze and lift against resistance, 5= strong squeeze against strong resistance)   TODAY'S TREATMENT  Neuromuscular Re-education: Supine hooklying diaphragmatic breathing with VCs and TCs for downregulation of the nervous system and improved management of IAP  Review of Sahrmann abdominal rehab   Supine hooklying TrA contraction with coordinated exhale   Quadruped diaphragmatic breathing with VC  Quadruped TrA activation with coordinated breath, VCs and TCs as needed   Discussion/simulation and demonstration of body mechanics and proper lifting techniques as Pt lifts heavy boxes at work and has to be in a bent over position for hours at a time. Discussion on angles of the body to match the machine releasing the tubes to reduce torque  Proper lifting mechanics with emphasis on holding object close  Pairing movement of box with squatting and coordinated breath   Patient response to interventions: Pt understanding of the importance of proper body mechanics to reduce injury at work and improve postural stability.    Patient Education:  Patient provided with HEP including: quadruped diaphragmatic  breathing and quadruped TrA contraction. Patient educated throughout session on appropriate technique and form using multi-modal cueing, HEP, and activity modification. Patient will benefit from further education in order to maximize compliance and understanding for long-term therapeutic gains.    ASSESSMENT:  Clinical Impression: Patient presents to clinic with excellent motivation to participate in today's session. Pt continues to demonstrate deficits in IAP management, PFM coordination, PFM strength, LE strength, ROM, posture, and pain. Pt reports a decrease in significant urgency when going to the bathroom and leakage as well. Pt feels she is able to hold her urine until she gets to the restroom. A simulation of the Pt's workplace was provided today with emphasis on body mechanics to reduce torque, decrease low back pain, and improved postural stability. Pt required moderate VCs and Tcs to reduce bodily compensations and to coordinate breathing while lifting boxes and squatting to place on the floor. Pt able to tolerate progression of the deep core. Pt responded well to all active and educational interventions. Patient will continue to benefit from skilled therapeutic intervention to address deficits in IAP management, PFM coordination, PFM strength, LE strength, ROM, posture, and pain in order to increase PLOF and improve overall QOL.    Objective Impairments: decreased coordination, decreased endurance, decreased strength, improper body mechanics, postural dysfunction, and pain.   Activity Limitations: bending, sitting, standing, sleeping, continence, toileting, and locomotion level  Personal Factors: Age, Behavior pattern, Time since onset of injury/illness/exacerbation, and 1-2 comorbidities: HTN and hypothyroidism  are also affecting patient's functional outcome.   Rehab Potential: Good  Clinical Decision Making: Evolving/moderate complexity  Evaluation Complexity:  Moderate  GOALS: Goals reviewed with patient? Yes  SHORT TERM GOALS: Target date: 03/02/2022  Patient will decrease intake of caffeinated beverages and other bladder irritants to less than/= 3 cups in order to manage urinary frequency and bladder irritation for improved overall QOL and participation at home and in the community. Baseline: drinks sweet tea at work as well as cranberry/orange juice, water in between Goal status: INITIAL    LONG TERM GOALS: Target date: 04/13/2022   Patient will report improved ability to control bladder via a decrease in "just in case" trips to the bathroom as indicated by a 24 hour period in order to demonstrate improved behavior pattern and allow for increased participation in activities outside of the home without limitation or disruption.  Baseline: "just in case" trip anytime during breaks at work or before leaving the house even though she does not have the urge  Goal status: INITIAL  2.  Patient will demonstrate independent and coordinated diaphragmatic breathing in supine with a 1:2 breathing pattern for improved down-regulation of the nervous system and improved management of intra-abdominal pressures in order to increase function at home and in the community. Baseline: present but inconsistent/delayed relaxation of PFM  Goal status: INITIAL  3.  Patient will report less than 5 incidents of stress urinary incontinence over the course of 3 weeks while sneezing/STS transfers in order to demonstrate improved PFM coordination, strength, and function for improved overall QOL. Baseline: every time she sneezes or from sit to stand when walking to the bathroom especially if she has an urge Goal status: INITIAL  4.  Patient will score  >/= 62 on FOTO Urinary Problem  in order to demonstrate decreased pain, improved PFM coordination, improved breathing mechanics and overall QOL.  Baseline: 48 Goal status: INITIAL  5.  Patient will be independent in the  performance of a HEP in order to participate fully, without limitation or disruptions, in activities at home and in the community.  Baseline: toileting posture handout Goal status: INITIAL    PLAN: PT Frequency: 1x/week  PT Duration: 12 weeks  Planned Interventions: Therapeutic exercises, Therapeutic activity, Neuromuscular re-education, Balance training, Gait training, Patient/Family education, Joint mobilization, Spinal mobilization, Cryotherapy, Moist heat, scar mobilization, Taping, and Manual therapy  Plan For Next Session: continue deep core, STS with breath?   [employer], PT, DPT  01/19/2022, 8:17 AM

## 2022-01-19 NOTE — Assessment & Plan Note (Signed)
Stable at the present time. 

## 2022-01-26 ENCOUNTER — Ambulatory Visit: Payer: 59 | Attending: Obstetrics

## 2022-01-26 DIAGNOSIS — R279 Unspecified lack of coordination: Secondary | ICD-10-CM | POA: Diagnosis present

## 2022-01-26 DIAGNOSIS — M6281 Muscle weakness (generalized): Secondary | ICD-10-CM | POA: Diagnosis present

## 2022-01-26 DIAGNOSIS — M6289 Other specified disorders of muscle: Secondary | ICD-10-CM | POA: Insufficient documentation

## 2022-01-26 NOTE — Therapy (Signed)
OUTPATIENT PHYSICAL THERAPY FEMALE PELVIC TREATMENT   Patient Name: Ashley [name] MRN: [id number] DOB:[birth year], 56 y.o., female Today's Date: 01/26/2022   PT End of Session - 01/26/22 0804     Visit Number 4    Number of Visits 12    Date for PT Re-Evaluation 03/30/22    PT Start Time 0802    PT Stop Time 0840    PT Time Calculation (min) 38 min    Activity Tolerance Patient tolerated treatment well             Past Medical History:  Diagnosis Date   Hypothyroidism    Thyroid enlarged    pt reports "being watched"   Past Surgical History:  Procedure Laterality Date   COLONOSCOPY WITH PROPOFOL N/A 12/26/2015   Procedure: COLONOSCOPY WITH PROPOFOL;  Surgeon: Lucilla Lame, MD;  Location: [place];  Service: Endoscopy;  Laterality: N/A;   DILATATION & CURETTAGE/HYSTEROSCOPY WITH MYOSURE N/A 02/13/2018   Procedure: DILATATION & CURETTAGE/HYSTEROSCOPY WITH GENESYSN HTA THERMAL ABLATION;  Surgeon: Rubie Maid, MD;  Location: [facility];  Service: Gynecology;  Laterality: N/A;   EXCISION MASS NECK Right 05/27/2015   Procedure: EXCISION MASS NECK RIGHT LIPOMA;  Surgeon: Clyde Canterbury, MD;  Location: [place];  Service: ENT;  Laterality: Right;   TONSILLECTOMY     TUBAL LIGATION     Patient Active Problem List   Diagnosis Date Noted   Hypothyroidism due to Hashimoto's thyroiditis 06/17/2020   Pure hypercholesterolemia 06/17/2020   Seasonal allergic rhinitis due to pollen 06/17/2020   UTI (urinary tract infection) due to Enterococcus 05/20/2020   History of 2019 novel coronavirus disease (COVID-19) 02/28/2019   Acquired hypothyroidism 02/28/2019   Obesity (BMI 30.0-34.9) 02/28/2019   Essential hypertension 02/28/2019   Menopausal vasomotor syndrome 02/28/2019   S/P endometrial ablation 02/13/2018   Premenstrual symptom 02/06/2016   Menstrual migraine without status migrainosus, not intractable 02/06/2016   Special screening for malignant neoplasms,  colon     PCP: Cletis Athens, MD  REFERRING PROVIDER: Lurlean Horns, CNM   REFERRING DIAG:  N81.10 (ICD-10-CM) - Bladder prolapse, female, acquired   THERAPY DIAG:  Unspecified lack of coordination  Muscle weakness (generalized)  Pelvic floor dysfunction  Rationale for Evaluation and Treatment: Rehabilitation  ONSET DATE: 2 months ago                                                                                                                                                                                          PRECAUTIONS: None  WEIGHT BEARING RESTRICTIONS: No  FALLS:  Has patient fallen in last 6 months? Yes. 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 Pt has to go to the bathroom frequently during the night and day. Pt reports having hot flashes at night but not necessarily getting up in the middle of the night to urinate. Pt feels she gets up in the middle of the night due to hot flashes. Pt does have urinary leakage when walking to the bathroom and sneezing. Pt had 2 episodes of UTI's within the past 6 months and is concerned about that. Pt has occasional B hip pain while lying down, but once the Pt gets up or readjusts she feels better. Pt does have to wake up and readjust from hip pain which disrupts her normal 5 hour sleep schedule since she works 3rd shift. Pt denies N/T down B LEs. Pt has family hx of hip arthritis.    LIVING ENVIRONMENT: Lives with: lives with their daughter Lives in: House/apartment   PATIENT GOALS: I would like to solve the main problem of my leakage and having to avoid wearing incontinence pads in the future.     UROLOGICAL HISTORY Fluid intake: Yes: water during 3rd  shift, sweet tea, cranberry juice/orange juice    Pain with urination: No Fully empty bladder: Yes Stream: strong in beginning, then stop and go Toileting posture: flat feet Urgency: No Frequency: 5-6x Nocturia: 0-1x  Leakage: Walking to the bathroom and Sneezing Pads: No  Bladder control (0-10): 8/10    OBSTETRICAL HISTORY Vaginal deliveries: G1P1 Tearing: Yes: stitches needed, cannot recall Grade Currently pregnant: No  GYNECOLOGICAL HISTORY Hysterectomy: no, tubal ligation  Pelvic Organ Prolapse: referral says bladder prolapse, but in note from Lloyd Huger, CNM does not say anything about Pt having a bladder prolapse  Heaviness/pressure: no  SUBJECTIVE: Pt reports she is doing well but feels like she is not doing the quadruped TrA activation correctly. Pt has been practicing lifting techniques at work. Pt feels that she does not have to run to the bathroom before getting ready for her 3rd shift.    PAIN:  Are you having pain? No     OBJECTIVE:   DIAGNOSTIC TESTING/IMPRESSIONS:    COGNITION: Overall cognitive status: Within functional limits for tasks assessed     POSTURE:  In sitting, B plantarflexion as Pt is wearing wedged heels. Pt enjoys wearing heels when she does not have to wear work boots.   Lumbar lordosis: increased in standing  Iliac crest height: WNL Pelvic obliquity: WNL   RANGE OF MOTION:    (Norm range in degrees)  LEFT 01/13/22 RIGHT 01/13/22  Lumbar forward flexion (65):  WNL    Lumbar extension (30): WNL    Lumbar lateral flexion (25):  WNL WNL  Thoracic and Lumbar rotation (30 degrees):    WNL WNL  Hip Flexion (0-125):   WNL WNL  Hip IR (0-45):  Restricted WNL  Hip ER (0-45):  Restricted WNL  Hip Adduction:      Hip Abduction (0-40):  WNL WNL  Hip extension (0-15):     (*= pain, Blank rows = not tested)   STRENGTH: MMT    RLE 01/13/22 LLE 01/13/22  Hip Flexion 4 4  Hip Extension 4 4  Hip Abduction     Hip  Adduction      Hip ER  5 5  Hip IR  5 5  Knee Extension 5 5  Knee Flexion 4 4  Dorsiflexion     Plantarflexion (seated) 5 5  (*= pain, Blank rows = not tested)   SPECIAL TESTS:   FABER (SN 81): negative B FADIR (SN 94): negative B    PALPATION:  Abdominal:  Diastasis: none, during assessment Valsalva maneuver observed   EXTERNAL PELVIC EXAM: Patient educated on the purpose of the pelvic exam and articulated understanding; patient consented to the exam verbally.  Breath coordination: present but inconsistent Voluntary Contraction: present, 3/5 MMT Relaxation: delayed Perineal movement with sustained IAP increase ("bear down"): minimal descent, observed Valsalva  Perineal movement with rapid IAP increase ("cough"): descent (0= no contraction, 1= flicker, 2= weak squeeze, 3= fair squeeze with lift, 4= good squeeze and lift against resistance, 5= strong squeeze against strong resistance)   TODAY'S TREATMENT  Neuromuscular Re-education: Review of quadruped diaphragmatic breathing with VCs and TCs for improved IAP management   Review of quadruped TrA activation with coordinated breath, VCs and TCs as needed for improved IAP management   Supine hooklying diaphragmatic breathing with VCs and TCs for downregulation of the nervous system and improved management of IAP  [facility]ann abdominal rehab   Supine hooklying TrA w/marches   Supine hooklying TrA contraction with UE challenge for improved IAP management   STSs with coordinated breathing for improved IAP management, VCs and TCs for proper technique    Patient response to interventions: Pt felt the TrA activation with marches to be easier than UE challenge.   Patient Education:  Patient provided with HEP including: supine march with TrA activation, TrA activation with supine punch, STS with breath.  Patient educated throughout session on appropriate technique and form using multi-modal cueing, HEP, and activity modification. Patient will  benefit from further education in order to maximize compliance and understanding for long-term therapeutic gains.    ASSESSMENT:  Clinical Impression: Patient presents to clinic with excellent motivation to participate in today's session. Pt continues to demonstrate deficits in IAP management, PFM coordination, PFM strength, LE strength, ROM, posture, and pain. Pt also continues to report a decrease in urgency and leakage when walking to the bathroom when feeling the sensation to urinate. Pt required significant VCs and TCs during deep core progression interventions to avoid bodily compensations and for proper technique. Review of quadruped breathing/TrA activation as part of HEP and Pt cued to decrease tempo of exercise (slow inhale and exhale). Pt responded well to all active and educational interventions. Patient will continue to benefit from skilled therapeutic intervention to address deficits in IAP management, PFM coordination, PFM strength, LE strength, ROM, posture, and pain in order to increase PLOF and improve overall QOL.    Objective Impairments: decreased coordination, decreased endurance, decreased strength, improper body mechanics, postural dysfunction, and pain.   Activity Limitations: bending, sitting, standing, sleeping, continence, toileting, and locomotion level  Personal Factors: Age, Behavior pattern, Time since onset of injury/illness/exacerbation, and 1-2 comorbidities: HTN and hypothyroidism  are also affecting patient's functional outcome.   Rehab Potential: Good  Clinical Decision Making: Evolving/moderate complexity  Evaluation Complexity: Moderate   GOALS: Goals reviewed with patient? Yes  SHORT TERM GOALS: Target date: 03/09/2022  Patient will decrease intake of caffeinated beverages and other bladder irritants to less than/= 3 cups in order to manage urinary frequency and bladder irritation for improved overall QOL and participation at home and in the  community. Baseline: drinks sweet tea at work as well as cranberry/orange juice, water in between Goal status: INITIAL    LONG TERM GOALS: Target date: 04/20/2022   Patient will report improved ability to control bladder via a decrease in "just in case" trips to the bathroom as indicated by a 24 hour period in order to demonstrate improved behavior pattern and allow for increased participation in activities outside of the home without limitation or disruption.  Baseline: "just in case" trip anytime during breaks at work or before leaving the house even though she does not have the urge  Goal status: INITIAL  2.  Patient will demonstrate independent and coordinated diaphragmatic breathing in supine with a 1:2 breathing pattern for improved down-regulation of the nervous system and improved management of intra-abdominal pressures in order to increase function at home and in the community. Baseline: present but inconsistent/delayed relaxation of PFM  Goal status: INITIAL  3.  Patient will report less than 5 incidents of stress urinary incontinence over the course of 3 weeks while sneezing/STS transfers in order to demonstrate improved PFM coordination, strength, and function for improved overall QOL. Baseline: every time she sneezes or from sit to stand when walking to the bathroom especially if she has an urge Goal status: INITIAL  4.  Patient will score  >/= 62 on FOTO Urinary Problem  in order to demonstrate decreased pain, improved PFM coordination, improved breathing mechanics and overall QOL.  Baseline: 48 Goal status: INITIAL  5.  Patient will be independent in the performance of a HEP in order to participate fully, without limitation or disruptions, in activities at home and in the community.  Baseline: toileting posture handout Goal status: INITIAL    PLAN: PT Frequency: 1x/week  PT Duration: 12 weeks  Planned Interventions: Therapeutic exercises, Therapeutic activity,  Neuromuscular re-education, Balance training, Gait training, Patient/Family education, Joint mobilization, Spinal mobilization, Cryotherapy, Moist heat, scar mobilization, Taping, and Manual therapy  Plan For Next Session: standing deep core, lying deep core, FOTO/review goals?    Shajuana Mclucas, PT, DPT  01/26/2022, 8:05 AM

## 2022-01-28 ENCOUNTER — Other Ambulatory Visit: Payer: Self-pay | Admitting: Internal Medicine

## 2022-02-02 ENCOUNTER — Ambulatory Visit: Payer: 59

## 2022-02-02 DIAGNOSIS — R279 Unspecified lack of coordination: Secondary | ICD-10-CM | POA: Diagnosis not present

## 2022-02-02 DIAGNOSIS — M6289 Other specified disorders of muscle: Secondary | ICD-10-CM

## 2022-02-02 DIAGNOSIS — M6281 Muscle weakness (generalized): Secondary | ICD-10-CM

## 2022-02-02 NOTE — Therapy (Addendum)
OUTPATIENT PHYSICAL THERAPY FEMALE PELVIC TREATMENT   Patient Name: Ashley [name] MRN: [id number] DOB:1965/08/24, 56 y.o., female Today's Date: 02/02/2022   PT End of Session - 02/02/22 0759     Visit Number 5    Number of Visits 12    Date for PT Re-Evaluation 03/30/22    PT Start Time 0800    PT Stop Time 0840    PT Time Calculation (min) 40 min    Activity Tolerance Patient tolerated treatment well             Past Medical History:  Diagnosis Date   Hypothyroidism    Thyroid enlarged    pt reports "being watched"   Past Surgical History:  Procedure Laterality Date   COLONOSCOPY WITH PROPOFOL N/A 12/26/2015   Procedure: COLONOSCOPY WITH PROPOFOL;  Surgeon: Lucilla Lame, MD;  Location: [place];  Service: Endoscopy;  Laterality: N/A;   DILATATION & CURETTAGE/HYSTEROSCOPY WITH MYOSURE N/A 02/13/2018   Procedure: DILATATION & CURETTAGE/HYSTEROSCOPY WITH GENESYSN HTA THERMAL ABLATION;  Surgeon: Rubie Maid, MD;  Location: [facility];  Service: Gynecology;  Laterality: N/A;   EXCISION MASS NECK Right 05/27/2015   Procedure: EXCISION MASS NECK RIGHT LIPOMA;  Surgeon: Clyde Canterbury, MD;  Location: [place];  Service: ENT;  Laterality: Right;   TONSILLECTOMY     TUBAL LIGATION     Patient Active Problem List   Diagnosis Date Noted   Hypothyroidism due to Hashimoto's thyroiditis 06/17/2020   Pure hypercholesterolemia 06/17/2020   Seasonal allergic rhinitis due to pollen 06/17/2020   UTI (urinary tract infection) due to Enterococcus 05/20/2020   History of 2019 novel coronavirus disease (COVID-19) 02/28/2019   Acquired hypothyroidism 02/28/2019   Obesity (BMI 30.0-34.9) 02/28/2019   Essential hypertension 02/28/2019   Menopausal vasomotor syndrome 02/28/2019   S/P endometrial ablation 02/13/2018   Premenstrual symptom 02/06/2016   Menstrual migraine without status migrainosus, not intractable 02/06/2016   Special screening for malignant neoplasms,  colon     PCP: Cletis Athens, MD  REFERRING PROVIDER: Lurlean Horns, CNM   REFERRING DIAG:  N81.10 (ICD-10-CM) - Bladder prolapse, female, acquired   THERAPY DIAG:  Unspecified lack of coordination  Muscle weakness (generalized)  Pelvic floor dysfunction  Rationale for Evaluation and Treatment: Rehabilitation  ONSET DATE: 2 months ago                                                                                                                                                                                          PRECAUTIONS: None  WEIGHT BEARING RESTRICTIONS: No  FALLS:  Has patient fallen in last 6 months? Yes. 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 Pt has to go to the bathroom frequently during the night and day. Pt reports having hot flashes at night but not necessarily getting up in the middle of the night to urinate. Pt feels she gets up in the middle of the night due to hot flashes. Pt does have urinary leakage when walking to the bathroom and sneezing. Pt had 2 episodes of UTI's within the past 6 months and is concerned about that. Pt has occasional B hip pain while lying down, but once the Pt gets up or readjusts she feels better. Pt does have to wake up and readjust from hip pain which disrupts her normal 5 hour sleep schedule since she works 3rd shift. Pt denies N/T down B LEs. Pt has family hx of hip arthritis.    LIVING ENVIRONMENT: Lives with: lives with their daughter Lives in: House/apartment   PATIENT GOALS: I would like to solve the main problem of my leakage and having to avoid wearing incontinence pads in the future.     UROLOGICAL HISTORY Fluid intake: Yes: water during 3rd  shift, sweet tea, cranberry juice/orange juice    Pain with urination: No Fully empty bladder: Yes Stream: strong in beginning, then stop and go Toileting posture: flat feet Urgency: No Frequency: 5-6x Nocturia: 0-1x  Leakage: Walking to the bathroom and Sneezing Pads: No  Bladder control (0-10): 8/10    OBSTETRICAL HISTORY Vaginal deliveries: G1P1 Tearing: Yes: stitches needed, cannot recall Grade Currently pregnant: No  GYNECOLOGICAL HISTORY Hysterectomy: no, tubal ligation  Pelvic Organ Prolapse: referral says bladder prolapse, but in note from Lloyd Huger, CNM does not say anything about Pt having a bladder prolapse  Heaviness/pressure: no  SUBJECTIVE: Pt has a head cold. Pt has been practicing HEP and is not sure she enjoys the quadruped TrA activation. Pt has not had any leakage the past week.    PAIN:  Are you having pain? No     OBJECTIVE:   DIAGNOSTIC TESTING/IMPRESSIONS:    COGNITION: Overall cognitive status: Within functional limits for tasks assessed     POSTURE:  In sitting, B plantarflexion as Pt is wearing wedged heels. Pt enjoys wearing heels when she does not have to wear work boots.   Lumbar lordosis: increased in standing  Iliac crest height: WNL Pelvic obliquity: WNL   RANGE OF MOTION:    (Norm range in degrees)  LEFT 01/13/22 RIGHT 01/13/22  Lumbar forward flexion (65):  WNL    Lumbar extension (30): WNL    Lumbar lateral flexion (25):  WNL WNL  Thoracic and Lumbar rotation (30 degrees):    WNL WNL  Hip Flexion (0-125):   WNL WNL  Hip IR (0-45):  Restricted WNL  Hip ER (0-45):  Restricted WNL  Hip Adduction:      Hip Abduction (0-40):  WNL WNL  Hip extension (0-15):     (*= pain, Blank rows = not tested)   STRENGTH: MMT    RLE 01/13/22 LLE 01/13/22  Hip Flexion 4 4  Hip Extension 4 4  Hip Abduction     Hip Adduction     Hip ER  5 5  Hip IR  5 5  Knee Extension 5 5  Knee Flexion 4 4  Dorsiflexion     Plantarflexion  (seated) 5 5  (*= pain, Blank rows = not tested)   SPECIAL TESTS:   FABER (SN 81): negative B FADIR (SN 94): negative B    PALPATION:  Abdominal:  Diastasis: none, during assessment Valsalva maneuver observed   EXTERNAL PELVIC EXAM: Patient educated on the purpose of the pelvic exam and articulated understanding; patient consented to the exam verbally.  Breath coordination: present but inconsistent Voluntary Contraction: present, 3/5 MMT Relaxation: delayed Perineal movement with sustained IAP increase ("bear down"): minimal descent, observed Valsalva  Perineal movement with rapid IAP increase ("cough"): descent (0= no contraction, 1= flicker, 2= weak squeeze, 3= fair squeeze with lift, 4= good squeeze and lift against resistance, 5= strong squeeze against strong resistance)   TODAY'S TREATMENT  Neuromuscular Re-education: FOTO Reassessment - Urinary Problem -  IE: 48; 5th visit: 64    Review of quadruped diaphragmatic breathing with VCs and TCs for improved IAP management   Review of quadruped TrA activation with coordinated breath for improved IAP management Cueing for proper technique and decrease bodily compensations  Seated hooklying diaphragmatic breathing with VCs and TCs for downregulation of the nervous system and improved management of IAP  Seated TrA activation then added LE challenge   Standing pallof press, GTB Bx10, for improved IAP management  VCs and TCs to decrease bodily compensations: elevated shoulders and locking of knees   Patient response to interventions: Pt comfortable to progress to every other week.    Patient Education:  Patient provided with HEP including: pallof press with GTB, seated TrA with marches/alternating arms and legs. Patient educated throughout session on appropriate technique and form using multi-modal cueing, HEP, and activity modification. Patient will benefit from further education in order to maximize compliance and  understanding for long-term therapeutic gains.    ASSESSMENT:  Clinical Impression: Patient presents to clinic with excellent motivation to participate in today's session. Pt continues to demonstrate deficits in IAP management, PFM coordination, PFM strength, LE strength, ROM, posture, and pain. Pt demonstrates significant improvement from IE on FOTO Urinary Problem (64) which is above target score of 62 in 5 visits. Pt required significant VCs to decrease tempo of TrA activation in order to allow optimal recruitment of musculature. Pt is pleased with progress and agrees to progressing to every other week sessions. Pt required moderate VCs and TCs during TrA activation in standing to decrease bodily compensations. Pt responded well to all active and educational interventions. Patient will continue to benefit from skilled therapeutic intervention to address deficits in IAP management, PFM coordination, PFM strength, LE strength, ROM, posture, and pain in order to increase PLOF and improve overall QOL.    Objective Impairments: decreased coordination, decreased endurance, decreased strength, improper body mechanics, postural dysfunction, and pain.   Activity Limitations: bending, sitting, standing, sleeping, continence, toileting, and locomotion level  Personal Factors: Age, Behavior pattern, Time since onset of injury/illness/exacerbation, and 1-2 comorbidities: HTN and hypothyroidism  are also affecting patient's functional outcome.   Rehab Potential: Good  Clinical Decision Making: Evolving/moderate complexity  Evaluation Complexity: Moderate   GOALS: Goals reviewed with patient? Yes  SHORT TERM GOALS: Target date: 03/16/2022  Patient will decrease intake of caffeinated beverages and other bladder irritants to less than/= 3 cups in order to manage urinary frequency and bladder irritation for improved overall QOL and participation at home and in the community. Baseline: drinks sweet tea  at work as  well as cranberry/orange juice, water in between Goal status: INITIAL    LONG TERM GOALS: Target date: 03/30/22  Patient will report improved ability to control bladder via a decrease in "just in case" trips to the bathroom as indicated by a 24 hour period in order to demonstrate improved behavior pattern and allow for increased participation in activities outside of the home without limitation or disruption.  Baseline: "just in case" trip anytime during breaks at work or before leaving the house even though she does not have the urge  Goal status: INITIAL  2.  Patient will demonstrate independent and coordinated diaphragmatic breathing in supine with a 1:2 breathing pattern for improved down-regulation of the nervous system and improved management of intra-abdominal pressures in order to increase function at home and in the community. Baseline: present but inconsistent/delayed relaxation of PFM  Goal status: INITIAL  3.  Patient will report less than 5 incidents of stress urinary incontinence over the course of 3 weeks while sneezing/STS transfers in order to demonstrate improved PFM coordination, strength, and function for improved overall QOL. Baseline: every time she sneezes or from sit to stand when walking to the bathroom especially if she has an urge Goal status: INITIAL  4.  Patient will score  >/= 62 on FOTO Urinary Problem  in order to demonstrate decreased pain, improved PFM coordination, improved breathing mechanics and overall QOL.  Baseline: 48, (8/8) 64  Goal status: MET  5.  Patient will be independent in the performance of a HEP in order to participate fully, without limitation or disruptions, in activities at home and in the community.  Baseline: toileting posture handout Goal status: INITIAL    PLAN: PT Frequency: 1x/week  PT Duration: 12 weeks  Planned Interventions: Therapeutic exercises, Therapeutic activity, Neuromuscular re-education, Balance  training, Gait training, Patient/Family education, Joint mobilization, Spinal mobilization, Cryotherapy, Moist heat, scar mobilization, Taping, and Manual therapy  Plan For Next Session: how was the 2 weeks, standing core, PFM strength begin?   Duwayne Matters, PT, DPT  02/02/2022, 8:02 AM

## 2022-02-16 ENCOUNTER — Ambulatory Visit: Payer: 59

## 2022-03-06 ENCOUNTER — Other Ambulatory Visit: Payer: Self-pay | Admitting: Internal Medicine

## 2022-03-06 DIAGNOSIS — E7800 Pure hypercholesterolemia, unspecified: Secondary | ICD-10-CM

## 2022-03-08 ENCOUNTER — Ambulatory Visit: Payer: 59 | Attending: Obstetrics

## 2022-03-08 DIAGNOSIS — M6289 Other specified disorders of muscle: Secondary | ICD-10-CM

## 2022-03-08 DIAGNOSIS — R279 Unspecified lack of coordination: Secondary | ICD-10-CM | POA: Diagnosis present

## 2022-03-08 DIAGNOSIS — M6281 Muscle weakness (generalized): Secondary | ICD-10-CM

## 2022-03-08 NOTE — Therapy (Signed)
OUTPATIENT PHYSICAL THERAPY FEMALE PELVIC TREATMENT   Patient Name: Ashley [name] MRN: [id number] DOB:12-17-1965, 56 y.o., female Today's Date: 03/08/2022   PT End of Session - 03/08/22 1529     Visit Number 6    Number of Visits 12    Date for PT Re-Evaluation 03/30/22    PT Start Time 4401    PT Stop Time 1608    PT Time Calculation (min) 38 min    Activity Tolerance Patient tolerated treatment well             Past Medical History:  Diagnosis Date   Hypothyroidism    Thyroid enlarged    pt reports "being watched"   Past Surgical History:  Procedure Laterality Date   COLONOSCOPY WITH PROPOFOL N/A 12/26/2015   Procedure: COLONOSCOPY WITH PROPOFOL;  Surgeon: Lucilla Lame, MD;  Location: [place];  Service: Endoscopy;  Laterality: N/A;   DILATATION & CURETTAGE/HYSTEROSCOPY WITH MYOSURE N/A 02/13/2018   Procedure: DILATATION & CURETTAGE/HYSTEROSCOPY WITH GENESYSN HTA THERMAL ABLATION;  Surgeon: Rubie Maid, MD;  Location: [facility];  Service: Gynecology;  Laterality: N/A;   EXCISION MASS NECK Right 05/27/2015   Procedure: EXCISION MASS NECK RIGHT LIPOMA;  Surgeon: Clyde Canterbury, MD;  Location: [place];  Service: ENT;  Laterality: Right;   TONSILLECTOMY     TUBAL LIGATION     Patient Active Problem List   Diagnosis Date Noted   Hypothyroidism due to Hashimoto's thyroiditis 06/17/2020   Pure hypercholesterolemia 06/17/2020   Seasonal allergic rhinitis due to pollen 06/17/2020   UTI (urinary tract infection) due to Enterococcus 05/20/2020   History of 2019 novel coronavirus disease (COVID-19) 02/28/2019   Acquired hypothyroidism 02/28/2019   Obesity (BMI 30.0-34.9) 02/28/2019   Essential hypertension 02/28/2019   Menopausal vasomotor syndrome 02/28/2019   S/P endometrial ablation 02/13/2018   Premenstrual symptom 02/06/2016   Menstrual migraine without status migrainosus, not intractable 02/06/2016   Special screening for malignant neoplasms,  colon     PCP: Cletis Athens, MD  REFERRING PROVIDER: Lurlean Horns, CNM   REFERRING DIAG:  N81.10 (ICD-10-CM) - Bladder prolapse, female, acquired   THERAPY DIAG:  Unspecified lack of coordination  Muscle weakness (generalized)  Pelvic floor dysfunction  Rationale for Evaluation and Treatment: Rehabilitation  ONSET DATE: 2 months ago                                                                                                                                                                                          PRECAUTIONS: None  WEIGHT BEARING RESTRICTIONS: No  FALLS:  Has patient fallen in last 6 months? Yes. 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 Pt has to go to the bathroom frequently during the night and day. Pt reports having hot flashes at night but not necessarily getting up in the middle of the night to urinate. Pt feels she gets up in the middle of the night due to hot flashes. Pt does have urinary leakage when walking to the bathroom and sneezing. Pt had 2 episodes of UTI's within the past 6 months and is concerned about that. Pt has occasional B hip pain while lying down, but once the Pt gets up or readjusts she feels better. Pt does have to wake up and readjust from hip pain which disrupts her normal 5 hour sleep schedule since she works 3rd shift. Pt denies N/T down B LEs. Pt has family hx of hip arthritis.    LIVING ENVIRONMENT: Lives with: lives with their daughter Lives in: House/apartment   PATIENT GOALS: I would like to solve the main problem of my leakage and having to avoid wearing incontinence pads in the future.     UROLOGICAL HISTORY Fluid intake: Yes: water during 3rd  shift, sweet tea, cranberry juice/orange juice    Pain with urination: No Fully empty bladder: Yes Stream: strong in beginning, then stop and go Toileting posture: flat feet Urgency: No Frequency: 5-6x Nocturia: 0-1x  Leakage: Walking to the bathroom and Sneezing Pads: No  Bladder control (0-10): 8/10    OBSTETRICAL HISTORY Vaginal deliveries: G1P1 Tearing: Yes: stitches needed, cannot recall Grade Currently pregnant: No  GYNECOLOGICAL HISTORY Hysterectomy: no, tubal ligation  Pelvic Organ Prolapse: referral says bladder prolapse, but in note from Lloyd Huger, CNM does not say anything about Pt having a bladder prolapse  Heaviness/pressure: no  SUBJECTIVE: Pt has been shifted to 1st shift which has shifted her sleeping schedule. Pt works in the heat inside a building and can be up to 111 degrees. Pt drinks a lot of water, Gatorade, and keeps hydrated throughout the day.    PAIN:  Are you having pain? No     OBJECTIVE:   DIAGNOSTIC TESTING/IMPRESSIONS:    COGNITION: Overall cognitive status: Within functional limits for tasks assessed     POSTURE:  In sitting, B plantarflexion as Pt is wearing wedged heels. Pt enjoys wearing heels when she does not have to wear work boots.   Lumbar lordosis: increased in standing  Iliac crest height: WNL Pelvic obliquity: WNL   RANGE OF MOTION:    (Norm range in degrees)  LEFT 01/13/22 RIGHT 01/13/22  Lumbar forward flexion (65):  WNL    Lumbar extension (30): WNL    Lumbar lateral flexion (25):  WNL WNL  Thoracic and Lumbar rotation (30 degrees):    WNL WNL  Hip Flexion (0-125):   WNL WNL  Hip IR (0-45):  Restricted WNL  Hip ER (0-45):  Restricted WNL  Hip Adduction:      Hip Abduction (0-40):  WNL WNL  Hip extension (0-15):     (*= pain, Blank rows = not tested)   STRENGTH: MMT    RLE 01/13/22 LLE 01/13/22  Hip Flexion 4 4  Hip Extension 4 4  Hip Abduction     Hip Adduction  Hip ER  5 5  Hip IR  5 5   Knee Extension 5 5  Knee Flexion 4 4  Dorsiflexion     Plantarflexion (seated) 5 5  (*= pain, Blank rows = not tested)   SPECIAL TESTS:   FABER (SN 81): negative B FADIR (SN 94): negative B    PALPATION:  Abdominal:  Diastasis: none, during assessment Valsalva maneuver observed   EXTERNAL PELVIC EXAM: Patient educated on the purpose of the pelvic exam and articulated understanding; patient consented to the exam verbally.  Breath coordination: present but inconsistent Voluntary Contraction: present, 3/5 MMT Relaxation: delayed Perineal movement with sustained IAP increase ("bear down"): minimal descent, observed Valsalva  Perineal movement with rapid IAP increase ("cough"): descent (0= no contraction, 1= flicker, 2= weak squeeze, 3= fair squeeze with lift, 4= good squeeze and lift against resistance, 5= strong squeeze against strong resistance)   TODAY'S TREATMENT  Neuromuscular Re-education: Review of LTGs and STGs below: Added new LTG until discharge  Pt has met 5/6 goals below   EXTERNAL PELVIC EXAM: Patient educated on the purpose of the pelvic exam and articulated understanding; patient consented to the exam verbally.  Breath coordination: present  Voluntary Contraction: present, 3/5 MMT, able to perform 4 quick flicks with consistent 3/5 MMT Relaxation: full  Perineal movement with sustained IAP increase ("bear down"): will assess next time Perineal movement with rapid IAP increase ("cough"): no change  (0= no contraction, 1= flicker, 2= weak squeeze, 3= fair squeeze with lift, 4= good squeeze and lift against resistance, 5= strong squeeze against strong resistance)   Patient educated on pressure management strategy ("the knack") with movements against gravity for improved IAP management and decreased urinary incontinence. Patient demonstrated, x2, and verbalized understanding.   Supine hooklying PFM strengthening for improved IAP management, able to perform 4 quick  flicks with consistent 3/5 MMT, VCs and TCs given  Patient response to interventions: Pt is very surprised about her progression in her goals    Patient Education:  Patient provided with HEP including: PFM strengthening and "the knack" technique. Patient educated throughout session on appropriate technique and form using multi-modal cueing, HEP, and activity modification. Patient will benefit from further education in order to maximize compliance and understanding for long-term therapeutic gains.    ASSESSMENT:  Clinical Impression: Patient presents to clinic with excellent motivation to participate in today's session. Pt continues to demonstrate deficits in IAP management, PFM coordination, PFM strength, LE strength, ROM, posture, and pain. Upon assessment of LTGs and STGs below, Pt has demonstrated significant improvement since IE as evidenced by minimal urinary leakage (2x over the past 3 weeks only with sneezing), decreased intake of bladder irritants (caffeinated beverages), present PFM breath coordination (IE: inconsistent), no Valsalva maneuver noted with PFM strength which Pt was able to perform 4 quick flicks with consistent strength, full PFM relaxation (IE: delayed), and no change in rapid IAP increase (IE: descent).Pt required moderate VCs and TCs during PFM strengthening technique. Pt responded well to all active and educational interventions. Patient will continue to benefit from skilled therapeutic intervention to address deficits in IAP management, PFM coordination, PFM strength, LE strength, ROM, posture, and pain in order to increase PLOF and improve overall QOL.    Objective Impairments: decreased coordination, decreased endurance, decreased strength, improper body mechanics, postural dysfunction, and pain.   Activity Limitations: bending, sitting, standing, sleeping, continence, toileting, and locomotion level  Personal Factors: Age, Behavior pattern, Time since onset of  injury/illness/exacerbation, and 1-2 comorbidities:  HTN and hypothyroidism  are also affecting patient's functional outcome.   Rehab Potential: Good  Clinical Decision Making: Evolving/moderate complexity  Evaluation Complexity: Moderate   GOALS: Goals reviewed with patient? Yes  SHORT TERM GOALS: Target date: 03/30/22  Patient will decrease intake of caffeinated beverages and other bladder irritants to less than/= 3 cups in order to manage urinary frequency and bladder irritation for improved overall QOL and participation at home and in the community. Baseline: drinks sweet tea at work as well as cranberry/orange juice, water in between; (9/11): water, Gatorade, sweet tea once a day  Goal status: MET    LONG TERM GOALS: Target date: 03/30/22  Patient will report improved ability to control bladder via a decrease in "just in case" trips to the bathroom as indicated by a 24 hour period in order to demonstrate improved behavior pattern and allow for increased participation in activities outside of the home without limitation or disruption.  Baseline: "just in case" trip anytime during breaks at work or before leaving the house even though she does not have the urge; (9/11): Pt only urinates when she receives the signal to urinate Goal status: MET  2.  Patient will demonstrate independent and coordinated diaphragmatic breathing in supine with a 1:2 breathing pattern for improved down-regulation of the nervous system and improved management of intra-abdominal pressures in order to increase function at home and in the community. Baseline: present but inconsistent/delayed relaxation of PFM; (9/11): present breathing and full relaxation of PFM Goal status: MET  3.  Patient will report less than 5 incidents of stress urinary incontinence over the course of 3 weeks while sneezing/STS transfers in order to demonstrate improved PFM coordination, strength, and function for improved overall  QOL. Baseline: every time she sneezes or from sit to stand when walking to the bathroom especially if she has an urge; (9/11): 1-2x with sneezing over past 3 weeks Goal status: MET  4.  Patient will score  >/= 62 on FOTO Urinary Problem  in order to demonstrate decreased pain, improved PFM coordination, improved breathing mechanics and overall QOL.  Baseline: 48, (8/8) 64  Goal status: MET  5.  Patient will be independent in the performance of a HEP in order to participate fully, without limitation or disruptions, in activities at home and in the community.  Baseline: toileting posture handout; (9/11): continues to progress with HEP Goal status: IN PROGRESS  6. Patient will demonstrate circumferential and sequential contraction of >3/5 MMT, > 5 sec hold x5 and 5 consecutive quick flicks with </= 10 min rest between testing bouts, and relaxation of the PFM coordinated with breath for improved management of intra-abdominal pressure and normal bowel and bladder function without the presence of pain nor incontinence in order to improve participation at home and in the community. Baseline: (9/11): 3/5 MMT with 4 quick flicks  Goal status: INITIAL    PLAN: PT Frequency: 1x/week  PT Duration: 12 weeks  Planned Interventions: Therapeutic exercises, Therapeutic activity, Neuromuscular re-education, Balance training, Gait training, Patient/Family education, Joint mobilization, Spinal mobilization, Cryotherapy, Moist heat, scar mobilization, Taping, and Manual therapy  Plan For Next Session:  progress deep core (mini squats, dead bugs/bird dog)    [employer], PT, DPT  03/08/2022, 5:48 PM

## 2022-03-15 ENCOUNTER — Ambulatory Visit: Payer: 59

## 2022-03-15 DIAGNOSIS — M6289 Other specified disorders of muscle: Secondary | ICD-10-CM

## 2022-03-15 DIAGNOSIS — R279 Unspecified lack of coordination: Secondary | ICD-10-CM

## 2022-03-15 DIAGNOSIS — M6281 Muscle weakness (generalized): Secondary | ICD-10-CM

## 2022-03-15 NOTE — Therapy (Signed)
OUTPATIENT PHYSICAL THERAPY FEMALE PELVIC TREATMENT   Patient Name: Ashley [name] MRN: [id number] DOB:[birth year], 56 y.o., female Today's Date: 03/15/2022   PT End of Session - 03/15/22 1532     Visit Number 7    Number of Visits 12    Date for PT Re-Evaluation 03/30/22    Authorization Type IE: 01/05/22    PT Start Time 1530    PT Stop Time 1610    PT Time Calculation (min) 40 min    Activity Tolerance Patient tolerated treatment well             Past Medical History:  Diagnosis Date   Hypothyroidism    Thyroid enlarged    pt reports "being watched"   Past Surgical History:  Procedure Laterality Date   COLONOSCOPY WITH PROPOFOL N/A 12/26/2015   Procedure: COLONOSCOPY WITH PROPOFOL;  Surgeon: Lucilla Lame, MD;  Location: [facility];  Service: Endoscopy;  Laterality: N/A;   DILATATION & CURETTAGE/HYSTEROSCOPY WITH MYOSURE N/A 02/13/2018   Procedure: DILATATION & CURETTAGE/HYSTEROSCOPY WITH GENESYSN HTA THERMAL ABLATION;  Surgeon: Rubie Maid, MD;  Location: [facility];  Service: Gynecology;  Laterality: N/A;   EXCISION MASS NECK Right 05/27/2015   Procedure: EXCISION MASS NECK RIGHT LIPOMA;  Surgeon: Clyde Canterbury, MD;  Location: [facility];  Service: ENT;  Laterality: Right;   TONSILLECTOMY     TUBAL LIGATION     Patient Active Problem List   Diagnosis Date Noted   Hypothyroidism due to Hashimoto's thyroiditis 06/17/2020   Pure hypercholesterolemia 06/17/2020   Seasonal allergic rhinitis due to pollen 06/17/2020   UTI (urinary tract infection) due to Enterococcus 05/20/2020   History of 2019 novel coronavirus disease (COVID-19) 02/28/2019   Acquired hypothyroidism 02/28/2019   Obesity (BMI 30.0-34.9) 02/28/2019   Essential hypertension 02/28/2019   Menopausal vasomotor syndrome 02/28/2019   S/P endometrial ablation 02/13/2018   Premenstrual symptom 02/06/2016   Menstrual migraine without status migrainosus, not intractable 02/06/2016   Special  screening for malignant neoplasms, colon     PCP: Cletis Athens, MD  REFERRING PROVIDER: Lurlean Horns, CNM   REFERRING DIAG:  N81.10 (ICD-10-CM) - Bladder prolapse, female, acquired   THERAPY DIAG:  Unspecified lack of coordination  Muscle weakness (generalized)  Pelvic floor dysfunction  Rationale for Evaluation and Treatment: Rehabilitation  ONSET DATE: 2 months ago                                                                                                                                                                                          PRECAUTIONS: None  WEIGHT BEARING RESTRICTIONS: No  FALLS:  Has patient fallen  in last 6 months? Yes. 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 Pt has to go to the bathroom frequently during the night and day. Pt reports having hot flashes at night but not necessarily getting up in the middle of the night to urinate. Pt feels she gets up in the middle of the night due to hot flashes. Pt does have urinary leakage when walking to the bathroom and sneezing. Pt had 2 episodes of UTI's within the past 6 months and is concerned about that. Pt has occasional B hip pain while lying down, but once the Pt gets up or readjusts she feels better. Pt does have to wake up and readjust from hip pain which disrupts her normal 5 hour sleep schedule since she works 3rd shift. Pt denies N/T down B LEs. Pt has family hx of hip arthritis.    LIVING ENVIRONMENT: Lives with: lives with their daughter Lives in: House/apartment   PATIENT GOALS: I would like to solve the main problem of my leakage and having to avoid wearing incontinence pads in the future.     UROLOGICAL  HISTORY Fluid intake: Yes: water during 3rd shift, sweet tea, cranberry juice/orange juice    Pain with urination: No Fully empty bladder: Yes Stream: strong in beginning, then stop and go Toileting posture: flat feet Urgency: No Frequency: 5-6x Nocturia: 0-1x  Leakage: Walking to the bathroom and Sneezing Pads: No  Bladder control (0-10): 8/10    OBSTETRICAL HISTORY Vaginal deliveries: G1P1 Tearing: Yes: stitches needed, cannot recall Grade Currently pregnant: No  GYNECOLOGICAL HISTORY Hysterectomy: no, tubal ligation  Pelvic Organ Prolapse: referral says bladder prolapse, but in note from Lloyd Huger, CNM does not say anything about Pt having a bladder prolapse  Heaviness/pressure: no  SUBJECTIVE: Pt has been shifted to 1st shift which has shifted her sleeping schedule. Pt works in the heat inside a building and can be up to 111 degrees. Pt drinks a lot of water, Gatorade, and keeps hydrated throughout the day.    PAIN:  Are you having pain? No     OBJECTIVE:   DIAGNOSTIC TESTING/IMPRESSIONS:    COGNITION: Overall cognitive status: Within functional limits for tasks assessed     POSTURE:  In sitting, B plantarflexion as Pt is wearing wedged heels. Pt enjoys wearing heels when she does not have to wear work boots.   Lumbar lordosis: increased in standing  Iliac crest height: WNL Pelvic obliquity: WNL   RANGE OF MOTION:    (Norm range in degrees)  LEFT 01/13/22 RIGHT 01/13/22  Lumbar forward flexion (65):  WNL    Lumbar extension (30): WNL    Lumbar lateral flexion (25):  WNL WNL  Thoracic and Lumbar rotation (30 degrees):    WNL WNL  Hip Flexion (0-125):   WNL WNL  Hip IR (0-45):  Restricted WNL  Hip ER (0-45):  Restricted WNL  Hip Adduction:      Hip Abduction (0-40):  WNL WNL  Hip extension (0-15):     (*= pain, Blank rows = not tested)   STRENGTH: MMT    RLE 01/13/22 LLE 01/13/22  Hip Flexion 4 4  Hip Extension 4 4  Hip Abduction  Hip  Adduction     Hip ER  5 5  Hip IR  5 5  Knee Extension 5 5  Knee Flexion 4 4  Dorsiflexion     Plantarflexion (seated) 5 5  (*= pain, Blank rows = not tested)   SPECIAL TESTS:   FABER (SN 81): negative B FADIR (SN 94): negative B    PALPATION:  Abdominal:  Diastasis: none, during assessment Valsalva maneuver observed   EXTERNAL PELVIC EXAM: Patient educated on the purpose of the pelvic exam and articulated understanding; patient consented to the exam verbally.  Breath coordination: present but inconsistent Voluntary Contraction: present, 3/5 MMT Relaxation: delayed Perineal movement with sustained IAP increase ("bear down"): minimal descent, observed Valsalva  Perineal movement with rapid IAP increase ("cough"): descent (0= no contraction, 1= flicker, 2= weak squeeze, 3= fair squeeze with lift, 4= good squeeze and lift against resistance, 5= strong squeeze against strong resistance)   TODAY'S TREATMENT  Neuromuscular Re-education: Standing pallof press, GTB Bx10, for improved IAP management  VCs and TCs to decrease bodily compensations: elevated shoulders   Standing wall squats with coordinated breath for improved IAP management, VCs and TCs required  Modified dead bug with coordinated breath, x10, for improved IAP management   Modified bird dog with coordinated breath, x10, for improved IAP management    Patient response to interventions: Pt comfortable to return in one month to discharge    Patient Education:  Patient provided with HEP including: all the above. Patient educated throughout session on appropriate technique and form using multi-modal cueing, HEP, and activity modification. Patient will benefit from further education in order to maximize compliance and understanding for long-term therapeutic gains.    ASSESSMENT:  Clinical Impression: Patient presents to clinic with excellent motivation to participate in today's session. Pt continues to demonstrate  deficits in IAP management, PFM coordination, PFM strength, LE strength, ROM, posture, and pain. Pt continues to demonstrates improved IAP management with no incidents of urinary leakage. Pt required moderate VCs and TCs during progression of TrA activation in various positions to decrease bodily compensations. Pt responded well to all active and educational interventions. Patient will continue to benefit from skilled therapeutic intervention to address deficits in IAP management, PFM coordination, PFM strength, LE strength, ROM, posture, and pain in order to increase PLOF and improve overall QOL.    Objective Impairments: decreased coordination, decreased endurance, decreased strength, improper body mechanics, postural dysfunction, and pain.   Activity Limitations: bending, sitting, standing, sleeping, continence, toileting, and locomotion level  Personal Factors: Age, Behavior pattern, Time since onset of injury/illness/exacerbation, and 1-2 comorbidities: HTN and hypothyroidism  are also affecting patient's functional outcome.   Rehab Potential: Good  Clinical Decision Making: Evolving/moderate complexity  Evaluation Complexity: Moderate   GOALS: Goals reviewed with patient? Yes  SHORT TERM GOALS: Target date: 03/30/22  Patient will decrease intake of caffeinated beverages and other bladder irritants to less than/= 3 cups in order to manage urinary frequency and bladder irritation for improved overall QOL and participation at home and in the community. Baseline: drinks sweet tea at work as well as cranberry/orange juice, water in between; (9/11): water, Gatorade, sweet tea once a day  Goal status: MET    LONG TERM GOALS: Target date: 03/30/22  Patient will report improved ability to control bladder via a decrease in "just in case" trips to the bathroom as indicated by a 24 hour period in order to demonstrate improved behavior pattern and allow for increased participation in activities  outside of the home without limitation or disruption.  Baseline: "just in case" trip anytime during breaks at work or before leaving the house even though she does not have the urge; (9/11): Pt only urinates when she receives the signal to urinate Goal status: MET  2.  Patient will demonstrate independent and coordinated diaphragmatic breathing in supine with a 1:2 breathing pattern for improved down-regulation of the nervous system and improved management of intra-abdominal pressures in order to increase function at home and in the community. Baseline: present but inconsistent/delayed relaxation of PFM; (9/11): present breathing and full relaxation of PFM Goal status: MET  3.  Patient will report less than 5 incidents of stress urinary incontinence over the course of 3 weeks while sneezing/STS transfers in order to demonstrate improved PFM coordination, strength, and function for improved overall QOL. Baseline: every time she sneezes or from sit to stand when walking to the bathroom especially if she has an urge; (9/11): 1-2x with sneezing over past 3 weeks Goal status: MET  4.  Patient will score  >/= 62 on FOTO Urinary Problem  in order to demonstrate decreased pain, improved PFM coordination, improved breathing mechanics and overall QOL.  Baseline: 48, (8/8) 64  Goal status: MET  5.  Patient will be independent in the performance of a HEP in order to participate fully, without limitation or disruptions, in activities at home and in the community.  Baseline: toileting posture handout; (9/11): continues to progress with HEP Goal status: IN PROGRESS  6. Patient will demonstrate circumferential and sequential contraction of >3/5 MMT, > 5 sec hold x5 and 5 consecutive quick flicks with </= 10 min rest between testing bouts, and relaxation of the PFM coordinated with breath for improved management of intra-abdominal pressure and normal bowel and bladder function without the presence of pain nor  incontinence in order to improve participation at home and in the community. Baseline: (9/11): 3/5 MMT with 4 quick flicks  Goal status: INITIAL    PLAN: PT Frequency: 1x/week  PT Duration: 12 weeks  Planned Interventions: Therapeutic exercises, Therapeutic activity, Neuromuscular re-education, Balance training, Gait training, Patient/Family education, Joint mobilization, Spinal mobilization, Cryotherapy, Moist heat, scar mobilization, Taping, and Manual therapy  Plan For Next Session:  discharge? How is leakage, check PFM external    Randolf Sansoucie, PT, DPT  03/15/2022, 3:32 PM

## 2022-04-13 ENCOUNTER — Ambulatory Visit: Payer: 59

## 2022-04-13 ENCOUNTER — Telehealth: Payer: Self-pay

## 2022-04-13 NOTE — Telephone Encounter (Signed)
DPT spoke to Pt about no-show and Pt is ok to discharge as today's visit was going to be a discharge. Pt is doing very well and will continue with HEP. Pt will call office if future complications arise.

## 2022-04-16 ENCOUNTER — Telehealth: Payer: Self-pay

## 2022-04-16 MED ORDER — FLUCONAZOLE 150 MG PO TABS: 150.0000 mg | ORAL_TABLET | Freq: Once | ORAL | 0 refills | Status: AC

## 2022-04-16 NOTE — Telephone Encounter (Signed)
Pt called triage requesting diflucan be sent to her pharmacy because she is pretty sure she has a yeast infection. She is having light itching and irritation. Advised the OTC monistat and she said she does not do good with it. Rx sent and pt to call the office if sx do not clear up.

## 2022-04-20 ENCOUNTER — Other Ambulatory Visit: Payer: Self-pay | Admitting: *Deleted

## 2022-04-20 MED ORDER — CIPROFLOXACIN HCL 250 MG PO TABS: 250.0000 mg | ORAL_TABLET | Freq: Two times a day (BID) | ORAL | 0 refills | Status: AC

## 2022-06-09 ENCOUNTER — Ambulatory Visit
Admission: RE | Admit: 2022-06-09 | Discharge: 2022-06-09 | Disposition: A | Discharge status: Home / Self Care | Payer: 59 | Source: Ambulatory Visit | Attending: Obstetrics | Admitting: Obstetrics

## 2022-06-09 DIAGNOSIS — Z1231 Encounter for screening mammogram for malignant neoplasm of breast: Secondary | ICD-10-CM | POA: Diagnosis not present

## 2022-07-18 ENCOUNTER — Other Ambulatory Visit: Payer: Self-pay | Admitting: Internal Medicine

## 2022-12-21 DIAGNOSIS — G5601 Carpal tunnel syndrome, right upper limb: Secondary | ICD-10-CM | POA: Diagnosis not present

## 2023-02-11 ENCOUNTER — Ambulatory Visit: Payer: 59 | Admitting: Obstetrics

## 2023-05-19 ENCOUNTER — Other Ambulatory Visit: Payer: Self-pay | Admitting: Internal Medicine

## 2023-05-19 DIAGNOSIS — Z1231 Encounter for screening mammogram for malignant neoplasm of breast: Secondary | ICD-10-CM

## 2023-05-20 DIAGNOSIS — Z23 Encounter for immunization: Secondary | ICD-10-CM | POA: Diagnosis not present

## 2023-06-15 ENCOUNTER — Ambulatory Visit
Admission: RE | Admit: 2023-06-15 | Discharge: 2023-06-15 | Disposition: A | Discharge status: Home / Self Care | Payer: BC Managed Care – PPO | Source: Ambulatory Visit | Attending: Internal Medicine | Admitting: Internal Medicine

## 2023-06-15 DIAGNOSIS — Z1231 Encounter for screening mammogram for malignant neoplasm of breast: Secondary | ICD-10-CM | POA: Insufficient documentation

## 2023-10-27 ENCOUNTER — Ambulatory Visit: Payer: Self-pay | Admitting: Nurse Practitioner

## 2023-10-27 ENCOUNTER — Encounter: Payer: Self-pay | Admitting: Nurse Practitioner

## 2023-10-27 VITALS — BP 132/86 | HR 87 | Temp 98.4°F | Resp 16 | Ht 65.0 in | Wt 208.4 lb

## 2023-10-27 DIAGNOSIS — E039 Hypothyroidism, unspecified: Secondary | ICD-10-CM

## 2023-10-27 DIAGNOSIS — E559 Vitamin D deficiency, unspecified: Secondary | ICD-10-CM

## 2023-10-27 DIAGNOSIS — E782 Mixed hyperlipidemia: Secondary | ICD-10-CM

## 2023-10-27 DIAGNOSIS — I10 Essential (primary) hypertension: Secondary | ICD-10-CM

## 2023-10-27 DIAGNOSIS — Z79899 Other long term (current) drug therapy: Secondary | ICD-10-CM

## 2023-10-27 DIAGNOSIS — H1033 Unspecified acute conjunctivitis, bilateral: Secondary | ICD-10-CM

## 2023-10-27 MED ORDER — OFLOXACIN 0.3 % OP SOLN: 1.0000 [drp] | Freq: Four times a day (QID) | OPHTHALMIC | 0 refills | Status: AC

## 2023-10-27 MED ORDER — SYNTHROID 50 MCG PO TABS: 50.0000 ug | ORAL_TABLET | Freq: Every day | ORAL | 3 refills | Status: DC

## 2023-10-27 MED ORDER — LISINOPRIL-HYDROCHLOROTHIAZIDE 20-12.5 MG PO TABS: 1.0000 | ORAL_TABLET | Freq: Every day | ORAL | 3 refills | Status: DC

## 2023-10-27 MED ORDER — IBUPROFEN 800 MG PO TABS: 800.0000 mg | ORAL_TABLET | Freq: Three times a day (TID) | ORAL | 1 refills | Status: DC | PRN

## 2023-10-27 MED ORDER — ROSUVASTATIN CALCIUM 20 MG PO TABS: 20.0000 mg | ORAL_TABLET | Freq: Every day | ORAL | 3 refills | Status: DC

## 2023-10-27 NOTE — Progress Notes (Signed)
 [facility] [address] [place], [place] [address]  Internal MEDICINE  Office Visit Note  Patient Name: Ashley [name]  [id number]  [id number]  Date of Service: 10/27/2023   Complaints/HPI Pt is here for establishment of PCP. Chief Complaint  Patient presents with  . New Patient (Initial Visit)    Eyes itching and burning, sinus bothering her.     HPI Ashley [name] presents for a new patient visit to establish care.  Well-appearing 58 y.o. female with hypertension, GERD, high cholesterol and sleeping problems. Work: Chief Financial Officer  Home: live at home with daughter  Diet: fair  Exercise: walking the track at [employer].  Tobacco use: none  Alcohol use: wine or mixed drinks occasionally  Illicit drug use: none  Routine CRC screening: due in 2027 Routine mammogram: done in December 2024  Pap smear: goes to [facility]: due for routine labs  New or worsening pain: none  Tried ambien  for sleep, is interested in having something to help her sleep.    Current Medication: Outpatient Encounter Medications as of 10/27/2023  Medication Sig  . ofloxacin  (OCUFLOX ) 0.3 % ophthalmic solution Place 1 drop into both eyes 4 (four) times daily for 5 days.  . omeprazole  (PRILOSEC) 40 MG capsule Take 1 capsule (40 mg total) by mouth daily.  . [DISCONTINUED] ibuprofen  (ADVIL ) 800 MG tablet TAKE 1 TABLET BY MOUTH EVERY 8 HOURS AS NEEDED FOR MODERATE PAIN.  . [DISCONTINUED] lisinopril -hydrochlorothiazide  (ZESTORETIC ) 20-12.5 MG tablet TAKE 1 TABLET BY MOUTH  DAILY  . [DISCONTINUED] rosuvastatin  (CRESTOR ) 20 MG tablet TAKE 1 TABLET BY MOUTH  DAILY  . [DISCONTINUED] SYNTHROID  50 MCG tablet TAKE 1 TABLET DAILY BEFORE BREAKFAST  . ibuprofen  (ADVIL ) 800 MG tablet Take 1 tablet (800 mg total) by mouth every 8 (eight) hours as needed for mild pain (pain score 1-3) or moderate pain (pain score 4-6).  . lisinopril -hydrochlorothiazide  (ZESTORETIC ) 20-12.5 MG  tablet Take 1 tablet by mouth daily.  . rosuvastatin  (CRESTOR ) 20 MG tablet Take 1 tablet (20 mg total) by mouth daily.  . SYNTHROID  50 MCG tablet Take 1 tablet (50 mcg total) by mouth daily before breakfast.  . [DISCONTINUED] zolpidem  (AMBIEN ) 10 MG tablet Take 0.5 tablets (5 mg total) by mouth at bedtime as needed for up to 15 days for sleep.   No facility-administered encounter medications on file as of 10/27/2023.    Surgical History: Past Surgical History:  Procedure Laterality Date  . COLONOSCOPY WITH PROPOFOL  N/A 12/26/2015   Procedure: COLONOSCOPY WITH PROPOFOL ;  Surgeon: Marnee Sink, MD;  Location: [facility];  Service: Endoscopy;  Laterality: N/A;  . DILATATION & CURETTAGE/HYSTEROSCOPY WITH MYOSURE N/A 02/13/2018   Procedure: DILATATION & CURETTAGE/HYSTEROSCOPY WITH GENESYSN HTA THERMAL ABLATION;  Surgeon: Teresa Fender, MD;  Location: [facility];  Service: Gynecology;  Laterality: N/A;  . EXCISION MASS NECK Right 05/27/2015   Procedure: EXCISION MASS NECK RIGHT LIPOMA;  Surgeon: Von Grumbling, MD;  [facility];  Service: ENT;  Laterality: Right;  . TONSILLECTOMY    . TUBAL LIGATION      Medical History: Past Medical History:  Diagnosis Date  . Hypothyroidism   . Thyroid  enlarged    pt reports "being watched"    Family History: Family History  Problem Relation Age of Onset  . Hypertension Mother   . Hypertension Father   . Diabetes Father   . Breast cancer Neg Hx     Social History   Socioeconomic History  .  Marital status: Single    Spouse name: Not on file  . Number of children: Not on file  . Years of education: Not on file  . Highest education level: Not on file  Occupational History  . Not on file  Tobacco Use  . Smoking status: Never  . Smokeless tobacco: Never  Vaping Use  . Vaping status: Never Used  Substance and Sexual Activity  . Alcohol use: Yes    Comment: OCC  . Drug use: No  . Sexual activity: Yes    Birth  control/protection: Surgical  Other Topics Concern  . Not on file  Social History Narrative  . Not on file   Social Drivers of Health   Financial Resource Strain: Not on file  Food Insecurity: Not on file  Transportation Needs: Not on file  Physical Activity: Not on file  Stress: Not on file  Social Connections: Not on file  Intimate Partner Violence: Not on file     Review of Systems  Constitutional:  Negative for chills, fatigue and unexpected weight change.  HENT:  Negative for congestion, rhinorrhea, sneezing and sore throat.   Eyes:  Positive for discharge, redness and itching.  Respiratory:  Negative for cough, chest tightness and shortness of breath.   Cardiovascular: Negative.  Negative for chest pain and palpitations.  Gastrointestinal:  Negative for abdominal pain, constipation, diarrhea, nausea and vomiting.  Genitourinary:  Negative for dysuria and frequency.  Musculoskeletal:  Negative for arthralgias, back pain, joint swelling and neck pain.  Skin:  Negative for rash.  Neurological: Negative.  Negative for tremors and numbness.  Hematological:  Negative for adenopathy. Does not bruise/bleed easily.  Psychiatric/Behavioral:  Positive for sleep disturbance. Negative for behavioral problems (Depression) and suicidal ideas. The patient is not nervous/anxious.     Vital Signs: BP 132/86   Pulse 87   Temp 98.4 F (36.9 C)   Resp 16   Ht 5\' 5"  (1.651 m)   Wt 208 lb 6.4 oz (94.5 kg)   SpO2 97%   BMI 34.68 kg/m    Physical Exam Vitals reviewed.  Constitutional:      General: She is not in acute distress.    Appearance: Normal appearance. She is obese. She is not ill-appearing.  HENT:     Head: Normocephalic and atraumatic.  Eyes:     Pupils: Pupils are equal, round, and reactive to light.  Cardiovascular:     Rate and Rhythm: Normal rate and regular rhythm.  Pulmonary:     Effort: Pulmonary effort is normal. No respiratory distress.  Neurological:      Mental Status: She is alert and oriented to person, place, and time.  Psychiatric:        Mood and Affect: Mood normal.        Behavior: Behavior normal.      Assessment/Plan: 1. Acute conjunctivitis of both eyes, unspecified acute conjunctivitis type (Primary) Take ofloxacin  eye drops as prescribed. - ofloxacin  (OCUFLOX ) 0.3 % ophthalmic solution; Place 1 drop into both eyes 4 (four) times daily for 5 days.  Dispense: 5 mL; Refill: 0  2. Essential hypertension Stable, continue lisinopril -hydrochlorothiazide  as prescribed. Routine labs ordered  - lisinopril -hydrochlorothiazide  (ZESTORETIC ) 20-12.5 MG tablet; Take 1 tablet by mouth daily.  Dispense: 90 tablet; Refill: 3 - CBC with Differential/Platelet - CMP14+EGFR - Lipid Profile  3. Mixed hyperlipidemia Continue rosuvastatin  as prescribed. Routine labs ordered  - rosuvastatin  (CRESTOR ) 20 MG tablet; Take 1 tablet (20 mg total) by mouth daily.  Dispense: 90 tablet; Refill: 3 - CBC with Differential/Platelet - CMP14+EGFR - Lipid Profile - TSH + free T4  4. Acquired hypothyroidism Continue synthroid  as prescribed. Routine labs ordered  - SYNTHROID  50 MCG tablet; Take 1 tablet (50 mcg total) by mouth daily before breakfast.  Dispense: 90 tablet; Refill: 3 - CBC with Differential/Platelet - CMP14+EGFR - Lipid Profile - TSH + free T4  5. Vitamin D  deficiency Routine labs ordered - Vitamin D  (25 hydroxy)  6. Encounter for medication review Medication list reviewed, updated and refills ordered.  - ibuprofen  (ADVIL ) 800 MG tablet; Take 1 tablet (800 mg total) by mouth every 8 (eight) hours as needed for mild pain (pain score 1-3) or moderate pain (pain score 4-6).  Dispense: 60 tablet; Refill: 1    General Counseling: Ashley [name] verbalizes understanding of the findings of todays visit and agrees with plan of treatment. I have discussed any further diagnostic evaluation that may be needed or ordered today. We also reviewed her  medications today. she has been encouraged to call the office with any questions or concerns that should arise related to todays visit.    Orders Placed This Encounter  Procedures  . CBC with Differential/Platelet  . CMP14+EGFR  . Lipid Profile  . TSH + free T4  . Vitamin D  (25 hydroxy)    Meds ordered this encounter  Medications  . ofloxacin  (OCUFLOX ) 0.3 % ophthalmic solution    Sig: Place 1 drop into both eyes 4 (four) times daily for 5 days.    Dispense:  5 mL    Refill:  0    Fill new script today  . lisinopril -hydrochlorothiazide  (ZESTORETIC ) 20-12.5 MG tablet    Sig: Take 1 tablet by mouth daily.    Dispense:  90 tablet    Refill:  3    Requesting 1 year supply  . ibuprofen  (ADVIL ) 800 MG tablet    Sig: Take 1 tablet (800 mg total) by mouth every 8 (eight) hours as needed for mild pain (pain score 1-3) or moderate pain (pain score 4-6).    Dispense:  60 tablet    Refill:  1  . rosuvastatin  (CRESTOR ) 20 MG tablet    Sig: Take 1 tablet (20 mg total) by mouth daily.    Dispense:  90 tablet    Refill:  3    Please send a replace/new response with 90-Day Supply if appropriate to maximize member benefit. Requesting 1 year supply.  . SYNTHROID  50 MCG tablet    Sig: Take 1 tablet (50 mcg total) by mouth daily before breakfast.    Dispense:  90 tablet    Refill:  3    For future refills . Must be brand name    Return for CPE, Ashley [name] PCP at earliest available opening, have labs done prior to visit. .  Time spent:30 Minutes Time spent with patient included reviewing progress notes, labs, imaging studies, and discussing plan for follow up.   [place] Controlled Substance Database was reviewed by me for overdose risk score (ORS)   This patient was seen by Laurence Pons, FNP-C in collaboration with Dr. Verneta Gone as a part of collaborative care agreement.   Ether Goebel R. Bobbi Burow, MSN, FNP-C Internal Medicine

## 2023-11-03 DIAGNOSIS — I10 Essential (primary) hypertension: Secondary | ICD-10-CM | POA: Diagnosis not present

## 2023-11-03 DIAGNOSIS — E039 Hypothyroidism, unspecified: Secondary | ICD-10-CM | POA: Diagnosis not present

## 2023-11-03 DIAGNOSIS — E7800 Pure hypercholesterolemia, unspecified: Secondary | ICD-10-CM | POA: Diagnosis not present

## 2023-11-03 DIAGNOSIS — E782 Mixed hyperlipidemia: Secondary | ICD-10-CM | POA: Diagnosis not present

## 2023-11-03 DIAGNOSIS — H1033 Unspecified acute conjunctivitis, bilateral: Secondary | ICD-10-CM | POA: Diagnosis not present

## 2023-11-03 DIAGNOSIS — E559 Vitamin D deficiency, unspecified: Secondary | ICD-10-CM | POA: Diagnosis not present

## 2023-11-04 LAB — CBC WITH DIFFERENTIAL/PLATELET
Basophils Absolute: 0 10*3/uL (ref 0.0–0.2)
Basos: 1 %
EOS (ABSOLUTE): 0.1 10*3/uL (ref 0.0–0.4)
Eos: 2 %
Hematocrit: 37.6 % (ref 34.0–46.6)
Hemoglobin: 12.2 g/dL (ref 11.1–15.9)
Immature Grans (Abs): 0 10*3/uL (ref 0.0–0.1)
Immature Granulocytes: 0 %
Lymphocytes Absolute: 2.5 10*3/uL (ref 0.7–3.1)
Lymphs: 49 %
MCH: 30.4 pg (ref 26.6–33.0)
MCHC: 32.4 g/dL (ref 31.5–35.7)
MCV: 94 fL (ref 79–97)
Monocytes Absolute: 0.4 10*3/uL (ref 0.1–0.9)
Monocytes: 8 %
Neutrophils Absolute: 2.1 10*3/uL (ref 1.4–7.0)
Neutrophils: 40 %
Platelets: 298 10*3/uL (ref 150–450)
RBC: 4.01 x10E6/uL (ref 3.77–5.28)
RDW: 12.8 % (ref 11.7–15.4)
WBC: 5.1 10*3/uL (ref 3.4–10.8)

## 2023-11-04 LAB — TSH+FREE T4
Free T4: 1.06 ng/dL (ref 0.82–1.77)
TSH: 0.703 u[IU]/mL (ref 0.450–4.500)

## 2023-11-04 LAB — LIPID PANEL
Chol/HDL Ratio: 2 ratio (ref 0.0–4.4)
Cholesterol, Total: 243 mg/dL — ABNORMAL HIGH (ref 100–199)
HDL: 123 mg/dL (ref >39)
LDL Chol Calc (NIH): 111 mg/dL — ABNORMAL HIGH (ref 0–99)
Triglycerides: 56 mg/dL (ref 0–149)
VLDL Cholesterol Cal: 9 mg/dL (ref 5–40)

## 2023-11-04 LAB — CMP14+EGFR
ALT: 16 IU/L (ref 0–32)
AST: 16 IU/L (ref 0–40)
Albumin: 4.5 g/dL (ref 3.8–4.9)
Alkaline Phosphatase: 81 IU/L (ref 44–121)
BUN/Creatinine Ratio: 19 (ref 9–23)
BUN: 18 mg/dL (ref 6–24)
Bilirubin Total: 0.2 mg/dL (ref 0.0–1.2)
CO2: 23 mmol/L (ref 20–29)
Calcium: 9.8 mg/dL (ref 8.7–10.2)
Chloride: 103 mmol/L (ref 96–106)
Creatinine, Ser: 0.97 mg/dL (ref 0.57–1.00)
Globulin, Total: 2.6 g/dL (ref 1.5–4.5)
Glucose: 89 mg/dL (ref 70–99)
Potassium: 4.2 mmol/L (ref 3.5–5.2)
Sodium: 142 mmol/L (ref 134–144)
Total Protein: 7.1 g/dL (ref 6.0–8.5)
eGFR: 69 mL/min/{1.73_m2} (ref >59)

## 2023-11-04 LAB — VITAMIN D 25 HYDROXY (VIT D DEFICIENCY, FRACTURES): Vit D, 25-Hydroxy: 9.3 ng/mL — ABNORMAL LOW (ref 30.0–100.0)

## 2023-12-02 ENCOUNTER — Telehealth: Payer: Self-pay | Admitting: Nurse Practitioner

## 2023-12-02 NOTE — Telephone Encounter (Signed)
 Left vm and sent mychart message to confirm 12/09/23 appointment-Toni

## 2023-12-09 ENCOUNTER — Encounter: Payer: Self-pay | Admitting: Nurse Practitioner

## 2023-12-09 ENCOUNTER — Ambulatory Visit (INDEPENDENT_AMBULATORY_CARE_PROVIDER_SITE_OTHER): Admitting: Nurse Practitioner

## 2023-12-09 VITALS — BP 130/82 | HR 84 | Temp 97.8°F | Resp 16 | Ht 65.0 in | Wt 213.2 lb

## 2023-12-09 DIAGNOSIS — E559 Vitamin D deficiency, unspecified: Secondary | ICD-10-CM

## 2023-12-09 DIAGNOSIS — Z0001 Encounter for general adult medical examination with abnormal findings: Secondary | ICD-10-CM

## 2023-12-09 DIAGNOSIS — E782 Mixed hyperlipidemia: Secondary | ICD-10-CM | POA: Diagnosis not present

## 2023-12-09 DIAGNOSIS — G479 Sleep disorder, unspecified: Secondary | ICD-10-CM | POA: Diagnosis not present

## 2023-12-09 MED ORDER — VITAMIN D (ERGOCALCIFEROL) 1.25 MG (50000 UNIT) PO CAPS
50000.0000 [IU] | ORAL_CAPSULE | ORAL | 1 refills | Status: DC
Start: 2023-12-09 — End: 2024-05-15

## 2023-12-09 NOTE — Progress Notes (Signed)
 [facility] [address] [place], [place] [address]  Internal MEDICINE  Office Visit Note  Patient Name: Ashley [name]  [id number]  [id number]  Date of Service: 12/09/2023  Chief Complaint  Patient presents with   Annual Exam    HPI Ashley [name] presents for an annual well visit and physical exam.  Well-appearing 58 y.o. female with hypertension, GERD, high cholesterol and sleeping problems.  Routine CRC screening: due in 2027 Routine mammogram: done in December last year  Pap smear: sees obgyn  Labs: discussed lab results today  New or worsening pain: none  Other concerns: poor sleep, wants to try something to go to sleep. Has tried melatonin.  Very low vitamin D  Slightly elevated LDL, improved from previous.  Stye under left eye Conjunctivitis has resolved    Current Medication: Outpatient Encounter Medications as of 12/09/2023  Medication Sig   Vitamin D , Ergocalciferol , (DRISDOL ) 1.25 MG (50000 UNIT) CAPS capsule Take 1 capsule (50,000 Units total) by mouth every 7 (seven) days.   ibuprofen  (ADVIL ) 800 MG tablet Take 1 tablet (800 mg total) by mouth every 8 (eight) hours as needed for mild pain (pain score 1-3) or moderate pain (pain score 4-6).   lisinopril -hydrochlorothiazide  (ZESTORETIC ) 20-12.5 MG tablet Take 1 tablet by mouth daily.   omeprazole  (PRILOSEC) 40 MG capsule Take 1 capsule (40 mg total) by mouth daily.   rosuvastatin  (CRESTOR ) 20 MG tablet Take 1 tablet (20 mg total) by mouth daily.   SYNTHROID  50 MCG tablet Take 1 tablet (50 mcg total) by mouth daily before breakfast.   No facility-administered encounter medications on file as of 12/09/2023.    Surgical History: Past Surgical History:  Procedure Laterality Date   COLONOSCOPY WITH PROPOFOL  N/A 12/26/2015   Procedure: COLONOSCOPY WITH PROPOFOL ;  Surgeon: Rogelia Copping, MD;  Location: [facility];  Service: Endoscopy;  Laterality: N/A;   DILATATION & CURETTAGE/HYSTEROSCOPY WITH MYOSURE  N/A 02/13/2018   Procedure: DILATATION & CURETTAGE/HYSTEROSCOPY WITH GENESYSN HTA THERMAL ABLATION;  Surgeon: Connell Davies, MD;  Location: [facility];  Service: Gynecology;  Laterality: N/A;   EXCISION MASS NECK Right 05/27/2015   Procedure: EXCISION MASS NECK RIGHT LIPOMA;  Surgeon: Deward Dolly, MD;  Location: [facility];  Service: ENT;  Laterality: Right;   TONSILLECTOMY     TUBAL LIGATION      Medical History: Past Medical History:  Diagnosis Date   Hypothyroidism    Thyroid  enlarged    pt reports being watched    Family History: Family History  Problem Relation Age of Onset   Hypertension Mother    Hypertension Father    Diabetes Father    Breast cancer Neg Hx     Social History   Socioeconomic History   Marital status: Single    Spouse name: Not on file   Number of children: Not on file   Years of education: Not on file   Highest education level: Not on file  Occupational History   Not on file  Tobacco Use   Smoking status: Never   Smokeless tobacco: Never  Vaping Use   Vaping status: Never Used  Substance and Sexual Activity   Alcohol use: Yes    Comment: OCC   Drug use: No   Sexual activity: Yes    Birth control/protection: Surgical  Other Topics Concern   Not on file  Social History Narrative   Not on file   Social Drivers of Health   Financial Resource Strain: Not on file  Food Insecurity: Not on file  Transportation Needs: Not on file  Physical Activity: Not on file  Stress: Not on file  Social Connections: Not on file  Intimate Partner Violence: Not on file      Review of Systems  Constitutional:  Negative for activity change, appetite change, chills, fatigue, fever and unexpected weight change.  HENT: Negative.  Negative for congestion, ear pain, rhinorrhea, sore throat and trouble swallowing.   Eyes: Negative.   Respiratory: Negative.  Negative for cough, chest tightness, shortness of breath and wheezing.   Cardiovascular:  Negative.  Negative for chest pain and palpitations.  Gastrointestinal: Negative.  Negative for abdominal pain, blood in stool, constipation, diarrhea, nausea and vomiting.  Endocrine: Negative.   Genitourinary: Negative.  Negative for difficulty urinating, dysuria, frequency, hematuria and urgency.  Musculoskeletal: Negative.  Negative for arthralgias, back pain, joint swelling, myalgias and neck pain.  Skin: Negative.  Negative for rash and wound.  Allergic/Immunologic: Negative.  Negative for immunocompromised state.  Neurological: Negative.  Negative for dizziness, seizures, numbness and headaches.  Hematological: Negative.   Psychiatric/Behavioral:  Positive for behavioral problems. Negative for self-injury, sleep disturbance and suicidal ideas. The patient is not nervous/anxious.     Vital Signs: BP 130/82   Pulse 84   Temp 97.8 F (36.6 C)   Resp 16   Ht 5' 5 (1.651 m)   Wt 213 lb 3.2 oz (96.7 kg)   SpO2 96%   BMI 35.48 kg/m    Physical Exam Vitals reviewed.  Constitutional:      General: She is not in acute distress.    Appearance: Normal appearance. She is obese. She is not ill-appearing.  HENT:     Head: Normocephalic and atraumatic.     Right Ear: Tympanic membrane, ear canal and external ear normal.     Left Ear: Tympanic membrane, ear canal and external ear normal.     Nose: Nose normal. No congestion or rhinorrhea.     Mouth/Throat:     Mouth: Mucous membranes are moist.     Pharynx: Oropharynx is clear. No oropharyngeal exudate or posterior oropharyngeal erythema.  Eyes:     General: No scleral icterus.       Right eye: No discharge.        Left eye: No discharge.     Extraocular Movements: Extraocular movements intact.     Conjunctiva/sclera: Conjunctivae normal.     Pupils: Pupils are equal, round, and reactive to light.  Cardiovascular:     Rate and Rhythm: Normal rate and regular rhythm.     Pulses: Normal pulses.     Heart sounds: Normal heart  sounds. No murmur heard. Pulmonary:     Effort: Pulmonary effort is normal. No respiratory distress.     Breath sounds: Normal breath sounds. No wheezing.  Abdominal:     General: Bowel sounds are normal. There is no distension.     Palpations: Abdomen is soft.     Tenderness: There is no abdominal tenderness. There is no guarding.  Musculoskeletal:        General: Normal range of motion.     Cervical back: Normal range of motion and neck supple.     Right lower leg: No edema.     Left lower leg: No edema.  Lymphadenopathy:     Cervical: No cervical adenopathy.  Skin:    General: Skin is warm and dry.     Capillary Refill: Capillary refill takes less than 2 seconds.  Neurological:     Mental Status: She is alert and oriented to person, place, and time.  Psychiatric:        Mood and Affect: Mood normal.        Behavior: Behavior normal.        Thought Content: Thought content normal.        Judgment: Judgment normal.        Assessment/Plan: 1. Encounter for routine adult health examination with abnormal findings (Primary) Age-appropriate preventive screenings and vaccinations discussed, annual physical exam completed. Routine labs for health maintenance results discussed with the patient and additional labs ordered. PHM updated.    2. Mixed hyperlipidemia Limit red meat intake, increase lean proteins in diet and may add OTC fish oil supplement.   3. Vitamin D  deficiency Take weekly vitamin D  supplement and repeat lab ordered  - Vitamin D , Ergocalciferol , (DRISDOL ) 1.25 MG (50000 UNIT) CAPS capsule; Take 1 capsule (50,000 Units total) by mouth every 7 (seven) days.  Dispense: 12 capsule; Refill: 1 - Vitamin D  (25 hydroxy)  4. Sleep disturbance Discussed OTC medications to try first.       General Counseling: Ashley [name] verbalizes understanding of the findings of todays visit and agrees with plan of treatment. I have discussed any further diagnostic evaluation that may be  needed or ordered today. We also reviewed her medications today. she has been encouraged to call the office with any questions or concerns that should arise related to todays visit.    Orders Placed This Encounter  Procedures   Vitamin D  (25 hydroxy)    Meds ordered this encounter  Medications   Vitamin D , Ergocalciferol , (DRISDOL ) 1.25 MG (50000 UNIT) CAPS capsule    Sig: Take 1 capsule (50,000 Units total) by mouth every 7 (seven) days.    Dispense:  12 capsule    Refill:  1    Return in about 6 months (around 06/09/2024) for F/U, Labs, Ashley [name] PCP.   Total time spent:30 Minutes Time spent includes review of chart, medications, test results, and follow up plan with the patient.   [place] Controlled Substance Database was reviewed by me.  This patient was seen by Mardy Maxin, FNP-C in collaboration with Dr. Sigrid Bathe as a part of collaborative care agreement.  Ashley Clink R. Maxin, MSN, FNP-C Internal medicine

## 2024-01-09 ENCOUNTER — Encounter: Payer: Self-pay | Admitting: Nurse Practitioner

## 2024-01-09 DIAGNOSIS — E559 Vitamin D deficiency, unspecified: Secondary | ICD-10-CM | POA: Insufficient documentation

## 2024-02-11 ENCOUNTER — Other Ambulatory Visit: Payer: Self-pay | Admitting: Nurse Practitioner

## 2024-02-11 DIAGNOSIS — Z79899 Other long term (current) drug therapy: Secondary | ICD-10-CM

## 2024-02-23 ENCOUNTER — Other Ambulatory Visit (HOSPITAL_COMMUNITY)
Admission: RE | Admit: 2024-02-23 | Discharge: 2024-02-23 | Disposition: A | Discharge status: Home / Self Care | Source: Ambulatory Visit | Attending: Licensed Practical Nurse | Admitting: Licensed Practical Nurse

## 2024-02-23 ENCOUNTER — Encounter: Payer: Self-pay | Admitting: Licensed Practical Nurse

## 2024-02-23 ENCOUNTER — Ambulatory Visit (INDEPENDENT_AMBULATORY_CARE_PROVIDER_SITE_OTHER): Admitting: Licensed Practical Nurse

## 2024-02-23 VITALS — BP 117/84 | HR 91 | Ht 65.0 in | Wt 213.0 lb

## 2024-02-23 DIAGNOSIS — N951 Menopausal and female climacteric states: Secondary | ICD-10-CM

## 2024-02-23 DIAGNOSIS — Z131 Encounter for screening for diabetes mellitus: Secondary | ICD-10-CM

## 2024-02-23 DIAGNOSIS — Z01419 Encounter for gynecological examination (general) (routine) without abnormal findings: Secondary | ICD-10-CM | POA: Diagnosis not present

## 2024-02-23 DIAGNOSIS — Z1231 Encounter for screening mammogram for malignant neoplasm of breast: Secondary | ICD-10-CM

## 2024-02-23 DIAGNOSIS — Z113 Encounter for screening for infections with a predominantly sexual mode of transmission: Secondary | ICD-10-CM | POA: Diagnosis not present

## 2024-02-23 DIAGNOSIS — Z124 Encounter for screening for malignant neoplasm of cervix: Secondary | ICD-10-CM | POA: Diagnosis not present

## 2024-02-23 MED ORDER — GABAPENTIN 300 MG PO CAPS: 300.0000 mg | ORAL_CAPSULE | Freq: Three times a day (TID) | ORAL | 4 refills | Status: AC

## 2024-02-23 NOTE — Progress Notes (Unsigned)
 Gynecology Annual Exam  PCP: Liana Fish, NP  Chief Complaint:  Chief Complaint  Patient presents with   Gynecologic Exam    History of Present Illness:Patient is a 58 y.o. G1P1001 presents for annual exam. The patient has no complaints today.   LMP: No LMP recorded. Patient is perimenopausal. Menarche:{numbers 0-81:85324} Average Interval: {Desc; regular/irreg:14544}, {numbers 22-35:14824} days Duration of flow: {numbers; 0-10:33138} days Heavy Menses: {yes/no:63} Clots: {yes/no:63} Intermenstrual Bleeding: {yes/no:63} Postcoital Bleeding: {yes/no:63} Dysmenorrhea: {yes/no:63}  The patient {sys sexually active:13135} sexually active. She {has/denies:315300} dyspareunia.  The patient {DOES_DOES WNU:81435} perform self breast exams.  There {is/is no:19420} notable family history of breast or ovarian cancer in her family.  The patient wears seatbelts: {yes/no:63}.   The patient has regular exercise: {yes/no/not asked:9010}.    The patient {Blank single:19197::reports,denies} current symptoms of depression.     Review of Systems: ROS  Past Medical History:  Patient Active Problem List   Diagnosis Date Noted   Vitamin D  deficiency 01/09/2024   Hypothyroidism due to Hashimoto's thyroiditis 06/17/2020   Pure hypercholesterolemia 06/17/2020   Seasonal allergic rhinitis due to pollen 06/17/2020   UTI (urinary tract infection) due to Enterococcus 05/20/2020   History of 2019 novel coronavirus disease (COVID-19) 02/28/2019   Acquired hypothyroidism 02/28/2019   Obesity (BMI 30.0-34.9) 02/28/2019   Essential hypertension 02/28/2019   Menopausal vasomotor syndrome 02/28/2019   S/P endometrial ablation 02/13/2018   Premenstrual symptom 02/06/2016   Menstrual migraine without status migrainosus, not intractable 02/06/2016   Special screening for malignant neoplasms, colon     Past Surgical History:  Past Surgical History:  Procedure Laterality Date   COLONOSCOPY  WITH PROPOFOL  N/A 12/26/2015   Procedure: COLONOSCOPY WITH PROPOFOL ;  Surgeon: Rogelia Copping, MD;  Location: [facility];  Service: Endoscopy;  Laterality: N/A;   DILATATION & CURETTAGE/HYSTEROSCOPY WITH MYOSURE N/A 02/13/2018   Procedure: DILATATION & CURETTAGE/HYSTEROSCOPY WITH GENESYSN HTA THERMAL ABLATION;  Surgeon: Connell Davies, MD;  Location: [facility];  Service: Gynecology;  Laterality: N/A;   EXCISION MASS NECK Right 05/27/2015   Procedure: EXCISION MASS NECK RIGHT LIPOMA;  Surgeon: Deward Dolly, MD;  Location: [facility];  Service: ENT;  Laterality: Right;   TONSILLECTOMY     TUBAL LIGATION      Gynecologic History:  No LMP recorded. Patient is perimenopausal. Last Pap: Results were: *** {Findings; lab pap smear results:16707::NIL and HR HPV+,NIL and HR HPV negative}  Last mammogram: *** Results were: {Blank single:19197::***,BI-RADS IV,BI-RAD III,BI-RAD II,BI-RAD I}  Obstetric History: G1P1001  Family History:  Family History  Problem Relation Age of Onset   Hypertension Mother    Hypertension Father    Diabetes Father    Breast cancer Neg Hx     Social History:  Social History   Socioeconomic History   Marital status: Single    Spouse name: Not on file   Number of children: Not on file   Years of education: Not on file   Highest education level: Not on file  Occupational History   Not on file  Tobacco Use   Smoking status: Never   Smokeless tobacco: Never  Vaping Use   Vaping status: Never Used  Substance and Sexual Activity   Alcohol use: Yes    Comment: OCC   Drug use: No   Sexual activity: Yes    Birth control/protection: Surgical  Other Topics Concern   Not on file  Social History Narrative   Not on file   Social Drivers of  Health   Financial Resource Strain: Not on file  Food Insecurity: Not on file  Transportation Needs: Not on file  Physical Activity: Not on file  Stress: Not on file  Social Connections: Not on  file  Intimate Partner Violence: Not on file    Allergies:  No Known Allergies  Medications: Prior to Admission medications   Medication Sig Start Date End Date Taking? Authorizing Provider  gabapentin  (NEURONTIN ) 300 MG capsule Take 1 capsule (300 mg total) by mouth 3 (three) times daily. Start by taking 1 tablet at night time x1 day, then 1 tablet twice a day x 1 day then 1 tablet tid 02/23/24  Yes Okey Zelek Marie, CNM  ibuprofen  (ADVIL ) 800 MG tablet TAKE 1 TABLET (800 MG TOTAL) BY MOUTH EVERY 8 (EIGHT) HOURS AS NEEDED FOR MILD PAIN (PAIN SCORE 1-3) OR MODERATE PAIN (PAIN SCORE 4-6). 02/13/24  Yes Abernathy, Mardy, NP  lisinopril -hydrochlorothiazide  (ZESTORETIC ) 20-12.5 MG tablet Take 1 tablet by mouth daily. 10/27/23  Yes Abernathy, Mardy, NP  omeprazole  (PRILOSEC) 40 MG capsule Take 1 capsule (40 mg total) by mouth daily. 09/07/21  Yes Masoud, Sheralyn, MD  rosuvastatin  (CRESTOR ) 20 MG tablet Take 1 tablet (20 mg total) by mouth daily. 10/27/23  Yes Liana Mardy, NP  SYNTHROID  50 MCG tablet Take 1 tablet (50 mcg total) by mouth daily before breakfast. 10/27/23  Yes Abernathy, Alyssa, NP  Vitamin D , Ergocalciferol , (DRISDOL ) 1.25 MG (50000 UNIT) CAPS capsule Take 1 capsule (50,000 Units total) by mouth every 7 (seven) days. 12/09/23  Yes Liana Mardy, NP    Physical Exam Vitals: Blood pressure 117/84, pulse 91, height 5' 5 (1.651 m), weight 213 lb (96.6 kg).  General: NAD HEENT: normocephalic, anicteric Thyroid : no enlargement, no palpable nodules Pulmonary: No increased work of breathing, CTAB Cardiovascular: RRR, distal pulses 2+ Breast: declined exam  Abdomen: NABS, soft, non-tender, non-distended.  Umbilicus without lesions.  No hepatomegaly, splenomegaly or masses palpable. No evidence of hernia  Genitourinary:  External: Normal external female genitalia.  Normal urethral meatus, normal Bartholin's and Skene's glands.    Vagina: Normal vaginal mucosa, no evidence of  prolapse.  Some tone   Cervix: Grossly normal in appearance, no bleeding  Uterus: Non-enlarged, mobile, normal contour.  No CMT  Adnexa: ovaries non-enlarged, no adnexal masses  Rectal: deferred  Lymphatic: no evidence of inguinal lymphadenopathy Extremities: no edema, erythema, or tenderness Neurologic: Grossly intact Psychiatric: mood appropriate, affect full   Assessment: 58 y.o. G1P1001 routine annual exam  Plan: Problem List Items Addressed This Visit   None Visit Diagnoses       Well woman exam    -  Primary   Relevant Orders   Cytology - PAP   HEP, RPR, HIV Panel   Hepatitis C antibody   Hemoglobin A1c   MM DIGITAL SCREENING BILATERAL     Cervical cancer screening       Relevant Orders   Cytology - PAP     Screening examination for venereal disease       Relevant Orders   Cytology - PAP   HEP, RPR, HIV Panel   Hepatitis C antibody     Screening for diabetes mellitus       Relevant Orders   Hemoglobin A1c     Vasomotor symptoms due to menopause       Relevant Medications   gabapentin  (NEURONTIN ) 300 MG capsule     Breast cancer screening by mammogram       Relevant Orders  MM DIGITAL SCREENING BILATERAL       1) Mammogram - recommend yearly screening mammogram.  Mammogram Was ordered today  2) STI screening  wasoffered and accepted  3) ASCCP guidelines and rational discussed.  Patient opts for every 5 years screening interval  4) Osteoporosis  - per USPTF routine screening DEXA at age 71   Consider FDA-approved medical therapies in postmenopausal women and men aged 45 years and older, based on the following: a) A hip or vertebral (clinical or morphometric) fracture b) T-score <= -2.5 at the femoral neck or spine after appropriate evaluation to exclude secondary causes C) Low bone mass (T-score between -1.0 and -2.5 at the femoral neck or spine) and a 10-year probability of a hip fracture >= 3% or a 10-year probability of a major osteoporosis-related  fracture >= 20% based on the [place] -adapted WHO algorithm   5) Routine healthcare maintenance including cholesterol, diabetes screening discussed managed by PCP  6) Colonoscopy up to date, 2017.  Screening recommended starting at age 22 for average risk individuals, age 51 for individuals deemed at increased risk (including African Americans) and recommended to continue until age 72.  For patient age 18-85 individualized approach is recommended.  Gold standard screening is via colonoscopy, Cologuard screening is an acceptable alternative for patient unwilling or unable to undergo colonoscopy.  Colorectal cancer screening for average?risk adults: 2018 guideline update from the American Cancer SocietyCA: A Cancer Journal for Clinicians: Nov 24, 2016   7) Return in about 3 months (around 05/25/2024). F/u on Menopausal symptoms, will try Gabapentin . May consider Veozah   8) Desires Weight loss, will see PCP. May send mychart ,message if referral to Dr Verdon is desired.    Jinnie Cookey, CNM  [facility] 02/23/24  1:25 PM

## 2024-02-24 LAB — HEP, RPR, HIV PANEL
HIV Screen 4th Generation wRfx: NONREACTIVE
Hepatitis B Surface Ag: NEGATIVE
RPR Ser Ql: NONREACTIVE

## 2024-02-24 LAB — HEPATITIS C ANTIBODY: Hep C Virus Ab: NONREACTIVE

## 2024-02-24 LAB — HEMOGLOBIN A1C
Est. average glucose Bld gHb Est-mCnc: 134 mg/dL
Hgb A1c MFr Bld: 6.3 % — ABNORMAL HIGH (ref 4.8–5.6)

## 2024-02-28 LAB — CYTOLOGY - PAP
Chlamydia: NEGATIVE
Comment: NEGATIVE
Comment: NEGATIVE
Comment: NEGATIVE
Comment: NORMAL
Diagnosis: NEGATIVE
High risk HPV: NEGATIVE
Neisseria Gonorrhea: NEGATIVE
Trichomonas: NEGATIVE

## 2024-03-02 ENCOUNTER — Ambulatory Visit: Payer: Self-pay | Admitting: Licensed Practical Nurse

## 2024-04-30 ENCOUNTER — Other Ambulatory Visit: Payer: Self-pay

## 2024-04-30 ENCOUNTER — Telehealth: Payer: Self-pay

## 2024-04-30 MED ORDER — CIPROFLOXACIN HCL 500 MG PO TABS: 500.0000 mg | ORAL_TABLET | Freq: Two times a day (BID) | ORAL | 0 refills | Status: AC

## 2024-04-30 NOTE — Telephone Encounter (Signed)
 Patient called stating she has a uti and isn't able to come in til Thursday as she work til 7pm, spoke with Alyssa was given the ok to send in Cipro  for 5 days and patient was notified if it isn't better in 5 days she will need to be seen in office.

## 2024-05-15 ENCOUNTER — Other Ambulatory Visit: Payer: Self-pay | Admitting: Nurse Practitioner

## 2024-05-15 ENCOUNTER — Encounter: Payer: Self-pay | Admitting: Nurse Practitioner

## 2024-05-15 ENCOUNTER — Other Ambulatory Visit: Payer: Self-pay

## 2024-05-15 DIAGNOSIS — E559 Vitamin D deficiency, unspecified: Secondary | ICD-10-CM

## 2024-05-15 DIAGNOSIS — Z1231 Encounter for screening mammogram for malignant neoplasm of breast: Secondary | ICD-10-CM

## 2024-05-15 MED ORDER — VITAMIN D (ERGOCALCIFEROL) 1.25 MG (50000 UNIT) PO CAPS: 50000.0000 [IU] | ORAL_CAPSULE | ORAL | 0 refills | Status: DC

## 2024-05-20 ENCOUNTER — Other Ambulatory Visit: Payer: Self-pay | Admitting: Nurse Practitioner

## 2024-05-20 DIAGNOSIS — E559 Vitamin D deficiency, unspecified: Secondary | ICD-10-CM

## 2024-05-21 NOTE — Telephone Encounter (Signed)
Lmom to pt to call us back  

## 2024-06-04 ENCOUNTER — Telehealth: Payer: Self-pay | Admitting: Nurse Practitioner

## 2024-06-04 NOTE — Telephone Encounter (Signed)
 Received vm over weekend to change Friday appt to a Thursday. Called patient back this morning, left her vm to call office-Toni

## 2024-06-07 ENCOUNTER — Other Ambulatory Visit: Payer: Self-pay | Admitting: Nurse Practitioner

## 2024-06-07 ENCOUNTER — Ambulatory Visit (INDEPENDENT_AMBULATORY_CARE_PROVIDER_SITE_OTHER): Admitting: Nurse Practitioner

## 2024-06-07 ENCOUNTER — Encounter: Payer: Self-pay | Admitting: Nurse Practitioner

## 2024-06-07 ENCOUNTER — Telehealth: Payer: Self-pay

## 2024-06-07 VITALS — BP 130/78 | HR 95 | Temp 97.2°F | Resp 16 | Ht 65.0 in | Wt 217.6 lb

## 2024-06-07 DIAGNOSIS — E063 Autoimmune thyroiditis: Secondary | ICD-10-CM

## 2024-06-07 DIAGNOSIS — K219 Gastro-esophageal reflux disease without esophagitis: Secondary | ICD-10-CM | POA: Diagnosis not present

## 2024-06-07 DIAGNOSIS — R7303 Prediabetes: Secondary | ICD-10-CM | POA: Diagnosis not present

## 2024-06-07 DIAGNOSIS — Z79899 Other long term (current) drug therapy: Secondary | ICD-10-CM | POA: Diagnosis not present

## 2024-06-07 DIAGNOSIS — N951 Menopausal and female climacteric states: Secondary | ICD-10-CM

## 2024-06-07 DIAGNOSIS — E559 Vitamin D deficiency, unspecified: Secondary | ICD-10-CM | POA: Diagnosis not present

## 2024-06-07 MED ORDER — ZEPBOUND 5 MG/0.5ML ~~LOC~~ SOAJ: 5.0000 mg | SUBCUTANEOUS | 5 refills | Status: DC

## 2024-06-07 MED ORDER — IBUPROFEN 800 MG PO TABS: 800.0000 mg | ORAL_TABLET | Freq: Three times a day (TID) | ORAL | 1 refills | Status: AC | PRN

## 2024-06-07 MED ORDER — PANTOPRAZOLE SODIUM 40 MG PO TBEC: 40.0000 mg | DELAYED_RELEASE_TABLET | Freq: Every day | ORAL | 3 refills | Status: AC

## 2024-06-07 MED ORDER — SYNTHROID 50 MCG PO TABS: 50.0000 ug | ORAL_TABLET | Freq: Every day | ORAL | 3 refills | Status: AC

## 2024-06-07 NOTE — Telephone Encounter (Signed)
 Completed PA for patient's Zepbound.

## 2024-06-07 NOTE — Progress Notes (Signed)
 [facility] [address] [place], [place] [address]  Internal MEDICINE  Office Visit Note  Patient Name: Ashley [name]  [id number]  [id number]  Date of Service: 06/07/2024  Chief Complaint  Patient presents with   Follow-up    HPI Oza presents for a follow-up visit for repeat labs, medication refills and weight loss.  Low vitamin D  level, has been taking weekly vitamin D  supplement  Prediabetes -- A1c was 6.3 a few months ago.  Obesity -- wants to try medication to help with weight loss.  Hot flashes -- taking gabapentin  for this.     Current Medication: Outpatient Encounter Medications as of 06/07/2024  Medication Sig   pantoprazole (PROTONIX) 40 MG tablet Take 1 tablet (40 mg total) by mouth daily.   tirzepatide (ZEPBOUND) 5 MG/0.5ML Pen Inject 5 mg into the skin once a week.   gabapentin  (NEURONTIN ) 300 MG capsule Take 1 capsule (300 mg total) by mouth 3 (three) times daily. Start by taking 1 tablet at night time x1 day, then 1 tablet twice a day x 1 day then 1 tablet tid   ibuprofen  (ADVIL ) 800 MG tablet Take 1 tablet (800 mg total) by mouth every 8 (eight) hours as needed for mild pain (pain score 1-3) or moderate pain (pain score 4-6).   lisinopril -hydrochlorothiazide  (ZESTORETIC ) 20-12.5 MG tablet Take 1 tablet by mouth daily.   rosuvastatin  (CRESTOR ) 20 MG tablet Take 1 tablet (20 mg total) by mouth daily.   SYNTHROID  50 MCG tablet Take 1 tablet (50 mcg total) by mouth daily before breakfast.   Vitamin D , Ergocalciferol , (DRISDOL ) 1.25 MG (50000 UNIT) CAPS capsule Take 1 capsule (50,000 Units total) by mouth every 7 (seven) days.   [DISCONTINUED] ibuprofen  (ADVIL ) 800 MG tablet TAKE 1 TABLET (800 MG TOTAL) BY MOUTH EVERY 8 (EIGHT) HOURS AS NEEDED FOR MILD PAIN (PAIN SCORE 1-3) OR MODERATE PAIN (PAIN SCORE 4-6).   [DISCONTINUED] omeprazole  (PRILOSEC) 40 MG capsule Take 1 capsule (40 mg total) by mouth daily.   [DISCONTINUED] SYNTHROID  50 MCG tablet  Take 1 tablet (50 mcg total) by mouth daily before breakfast.   No facility-administered encounter medications on file as of 06/07/2024.    Surgical History: Past Surgical History:  Procedure Laterality Date   COLONOSCOPY WITH PROPOFOL  N/A 12/26/2015   Procedure: COLONOSCOPY WITH PROPOFOL ;  Surgeon: Rogelia Copping, MD;  Location: [facility];  Service: Endoscopy;  Laterality: N/A;   DILATATION & CURETTAGE/HYSTEROSCOPY WITH MYOSURE N/A 02/13/2018   Procedure: DILATATION & CURETTAGE/HYSTEROSCOPY WITH GENESYSN HTA THERMAL ABLATION;  Surgeon: Connell Davies, MD;  Location: [facility];  Service: Gynecology;  Laterality: N/A;   EXCISION MASS NECK Right 05/27/2015   Procedure: EXCISION MASS NECK RIGHT LIPOMA;  Surgeon: Deward Dolly, MD;  Location: [facility];  Service: ENT;  Laterality: Right;   TONSILLECTOMY     TUBAL LIGATION      Medical History: Past Medical History:  Diagnosis Date   Hypothyroidism    Thyroid  enlarged    pt reports being watched    Family History: Family History  Problem Relation Age of Onset   Hypertension Mother    Hypertension Father    Diabetes Father    Breast cancer Neg Hx     Social History   Socioeconomic History   Marital status: Single    Spouse name: Not on file   Number of children: Not on file   Years of education: Not on file   Highest education level: Not on file  Occupational History   Not on file  Tobacco Use   Smoking status: Never   Smokeless tobacco: Never  Vaping Use   Vaping status: Never Used  Substance and Sexual Activity   Alcohol use: Yes    Comment: OCC   Drug use: No   Sexual activity: Yes    Birth control/protection: Surgical  Other Topics Concern   Not on file  Social History Narrative   Not on file   Social Drivers of Health   Tobacco Use: Low Risk (06/07/2024)   Patient History    Smoking Tobacco Use: Never    Smokeless Tobacco Use: Never    Passive Exposure: Not on file  Financial Resource  Strain: Not on file  Food Insecurity: Not on file  Transportation Needs: Not on file  Physical Activity: Not on file  Stress: Not on file  Social Connections: Not on file  Intimate Partner Violence: Not on file  Depression (PHQ2-9): Low Risk (12/09/2023)   Depression (PHQ2-9)    PHQ-2 Score: 0  Alcohol Screen: Not on file  Housing: Not on file  Utilities: Not on file  Health Literacy: Not on file      Review of Systems  Constitutional:  Negative for activity change, appetite change, chills, fatigue, fever and unexpected weight change.  HENT: Negative.  Negative for congestion, ear pain, rhinorrhea, sore throat and trouble swallowing.   Eyes: Negative.   Respiratory: Negative.  Negative for cough, chest tightness, shortness of breath and wheezing.   Cardiovascular: Negative.  Negative for chest pain and palpitations.  Gastrointestinal: Negative.  Negative for abdominal pain, blood in stool, constipation, diarrhea, nausea and vomiting.  Endocrine: Negative.   Genitourinary: Negative.  Negative for difficulty urinating, dysuria, frequency, hematuria and urgency.  Musculoskeletal: Negative.  Negative for arthralgias, back pain, joint swelling, myalgias and neck pain.  Skin: Negative.  Negative for rash and wound.  Allergic/Immunologic: Negative.  Negative for immunocompromised state.  Neurological: Negative.  Negative for dizziness, seizures, numbness and headaches.  Hematological: Negative.   Psychiatric/Behavioral:  Positive for behavioral problems. Negative for self-injury, sleep disturbance and suicidal ideas. The patient is not nervous/anxious.     Vital Signs: BP 130/78   Pulse 95   Temp (!) 97.2 F (36.2 C)   Resp 16   Ht 5' 5 (1.651 m)   Wt 217 lb 9.6 oz (98.7 kg)   SpO2 98%   BMI 36.21 kg/m    Physical Exam Vitals reviewed.  Constitutional:      General: She is not in acute distress.    Appearance: Normal appearance. She is obese. She is not ill-appearing.   HENT:     Head: Normocephalic and atraumatic.  Eyes:     Pupils: Pupils are equal, round, and reactive to light.  Cardiovascular:     Rate and Rhythm: Normal rate and regular rhythm.  Pulmonary:     Effort: Pulmonary effort is normal. No respiratory distress.  Skin:    General: Skin is warm and dry.     Capillary Refill: Capillary refill takes less than 2 seconds.  Neurological:     Mental Status: She is alert and oriented to person, place, and time.  Psychiatric:        Mood and Affect: Mood normal.        Behavior: Behavior normal.        Assessment/Plan: 1. Prediabetes (Primary) Repeat a1c ordered - Hgb A1C w/o eAG  2. Gastroesophageal reflux disease without esophagitis Discontinue omeprazole  and  start pantoprazole as prescribed.  - pantoprazole (PROTONIX) 40 MG tablet; Take 1 tablet (40 mg total) by mouth daily.  Dispense: 90 tablet; Refill: 3  3. Vitamin D  deficiency Routine lab ordered  - Vitamin D  (25 hydroxy)  4. Hypothyroidism due to Hashimoto's thyroiditis Repeat labs ordered. Continue synthroid  as prescribed.  - SYNTHROID  50 MCG tablet; Take 1 tablet (50 mcg total) by mouth daily before breakfast.  Dispense: 90 tablet; Refill: 3 - TSH + free T4  5. Vasomotor symptoms due to menopause Continue gabapentin  as prescribed.   6. Morbid obesity (HCC) Will see if zepbound is covered by her insurance, if approved, will give patient 4 week sample of 2.5 mg  - tirzepatide (ZEPBOUND) 5 MG/0.5ML Pen; Inject 5 mg into the skin once a week.  Dispense: 2 mL; Refill: 5  7. Encounter for medication review Medication list reviewed, updated and refills ordered  - ibuprofen  (ADVIL ) 800 MG tablet; Take 1 tablet (800 mg total) by mouth every 8 (eight) hours as needed for mild pain (pain score 1-3) or moderate pain (pain score 4-6).  Dispense: 60 tablet; Refill: 1   General Counseling: Sherrey verbalizes understanding of the findings of todays visit and agrees with plan of  treatment. I have discussed any further diagnostic evaluation that may be needed or ordered today. We also reviewed her medications today. she has been encouraged to call the office with any questions or concerns that should arise related to todays visit.    Orders Placed This Encounter  Procedures   Vitamin D  (25 hydroxy)   TSH + free T4   Hgb A1C w/o eAG    Meds ordered this encounter  Medications   SYNTHROID  50 MCG tablet    Sig: Take 1 tablet (50 mcg total) by mouth daily before breakfast.    Dispense:  90 tablet    Refill:  3    For future refills . Must be brand name   ibuprofen  (ADVIL ) 800 MG tablet    Sig: Take 1 tablet (800 mg total) by mouth every 8 (eight) hours as needed for mild pain (pain score 1-3) or moderate pain (pain score 4-6).    Dispense:  60 tablet    Refill:  1   pantoprazole (PROTONIX) 40 MG tablet    Sig: Take 1 tablet (40 mg total) by mouth daily.    Dispense:  90 tablet    Refill:  3    Discontinue omeprazole  and fill new script today   tirzepatide (ZEPBOUND) 5 MG/0.5ML Pen    Sig: Inject 5 mg into the skin once a week.    Dispense:  2 mL    Refill:  5    Dx code E66.01. please send prior auth request    Return in about 1 month (around 07/08/2024) for F/U, eval new med, Keviana Guida PCP.   Total time spent:30 Minutes Time spent includes review of chart, medications, test results, and follow up plan with the patient.   [place] Controlled Substance Database was reviewed by me.  This patient was seen by Mardy Maxin, FNP-C in collaboration with Dr. Sigrid Bathe as a part of collaborative care agreement.   Graceson Nichelson R. Maxin, MSN, FNP-C Internal medicine

## 2024-06-08 ENCOUNTER — Ambulatory Visit: Admitting: Nurse Practitioner

## 2024-07-05 ENCOUNTER — Ambulatory Visit
Admission: RE | Admit: 2024-07-05 | Discharge: 2024-07-05 | Disposition: A | Discharge status: Home / Self Care | Source: Ambulatory Visit | Attending: Nurse Practitioner | Admitting: Nurse Practitioner

## 2024-07-05 DIAGNOSIS — Z1231 Encounter for screening mammogram for malignant neoplasm of breast: Secondary | ICD-10-CM | POA: Diagnosis present

## 2024-07-12 ENCOUNTER — Ambulatory Visit: Admitting: Nurse Practitioner

## 2024-07-20 LAB — VITAMIN D 25 HYDROXY (VIT D DEFICIENCY, FRACTURES): Vit D, 25-Hydroxy: 40 ng/mL (ref 30.0–100.0)

## 2024-07-20 LAB — TSH+FREE T4
Free T4: 1.23 ng/dL (ref 0.82–1.77)
TSH: 1.05 u[IU]/mL (ref 0.450–4.500)

## 2024-07-20 LAB — HGB A1C W/O EAG: Hgb A1c MFr Bld: 6.2 % — ABNORMAL HIGH (ref 4.8–5.6)

## 2024-07-26 ENCOUNTER — Encounter: Payer: Self-pay | Admitting: Nurse Practitioner

## 2024-07-26 ENCOUNTER — Other Ambulatory Visit: Payer: Self-pay | Admitting: Nurse Practitioner

## 2024-07-26 ENCOUNTER — Ambulatory Visit (INDEPENDENT_AMBULATORY_CARE_PROVIDER_SITE_OTHER): Admitting: Nurse Practitioner

## 2024-07-26 VITALS — BP 132/86 | HR 93 | Temp 96.6°F | Resp 16 | Ht 65.0 in | Wt 222.6 lb

## 2024-07-26 DIAGNOSIS — E063 Autoimmune thyroiditis: Secondary | ICD-10-CM

## 2024-07-26 DIAGNOSIS — E782 Mixed hyperlipidemia: Secondary | ICD-10-CM

## 2024-07-26 DIAGNOSIS — E559 Vitamin D deficiency, unspecified: Secondary | ICD-10-CM | POA: Diagnosis not present

## 2024-07-26 DIAGNOSIS — I10 Essential (primary) hypertension: Secondary | ICD-10-CM

## 2024-07-26 DIAGNOSIS — K219 Gastro-esophageal reflux disease without esophagitis: Secondary | ICD-10-CM | POA: Diagnosis not present

## 2024-07-26 DIAGNOSIS — R7303 Prediabetes: Secondary | ICD-10-CM | POA: Diagnosis not present

## 2024-07-26 MED ORDER — LISINOPRIL-HYDROCHLOROTHIAZIDE 20-12.5 MG PO TABS: 1.0000 | ORAL_TABLET | Freq: Every day | ORAL | 3 refills | Status: AC

## 2024-07-26 MED ORDER — ZEPBOUND 5 MG/0.5ML ~~LOC~~ SOAJ: 5.0000 mg | SUBCUTANEOUS | 5 refills | Status: AC

## 2024-07-26 MED ORDER — ROSUVASTATIN CALCIUM 20 MG PO TABS: 20.0000 mg | ORAL_TABLET | Freq: Every day | ORAL | 3 refills | Status: AC

## 2024-07-26 MED ORDER — VITAMIN D (ERGOCALCIFEROL) 1.25 MG (50000 UNIT) PO CAPS: 50000.0000 [IU] | ORAL_CAPSULE | ORAL | 0 refills | Status: AC

## 2024-07-26 NOTE — Progress Notes (Signed)
 [facility] [address] [place], [place] [address]  Internal MEDICINE  Office Visit Note  Patient Name: Ashley [name]  [id number]  [id number]  Date of Service: 07/26/2024  Chief Complaint  Patient presents with   Follow-up    Review labs    HPI Ashley [name] presents for a follow-up visit for lab results, GERD and weight loss.  Vitamin D  deficiency -- significantly improved to normal range. Prediabetes --  A1c slightly improved to 6.2  Thyroid  lab was normal range. GERD -- doing better on pantoprazole   Hypertension controlled with lisinopril -hydrochlorothiazide   High cholesterol -- taking rosuvastatin  daily.  Zepbound  was never approved, will try again.    Current Medication: Outpatient Encounter Medications as of 07/26/2024  Medication Sig   gabapentin  (NEURONTIN ) 300 MG capsule Take 1 capsule (300 mg total) by mouth 3 (three) times daily. Start by taking 1 tablet at night time x1 day, then 1 tablet twice a day x 1 day then 1 tablet tid   ibuprofen  (ADVIL ) 800 MG tablet Take 1 tablet (800 mg total) by mouth every 8 (eight) hours as needed for mild pain (pain score 1-3) or moderate pain (pain score 4-6).   lisinopril -hydrochlorothiazide  (ZESTORETIC ) 20-12.5 MG tablet Take 1 tablet by mouth daily.   pantoprazole  (PROTONIX ) 40 MG tablet Take 1 tablet (40 mg total) by mouth daily.   rosuvastatin  (CRESTOR ) 20 MG tablet Take 1 tablet (20 mg total) by mouth daily.   SYNTHROID  50 MCG tablet Take 1 tablet (50 mcg total) by mouth daily before breakfast.   tirzepatide  (ZEPBOUND ) 5 MG/0.5ML Pen Inject 5 mg into the skin once a week.   Vitamin D , Ergocalciferol , (DRISDOL ) 1.25 MG (50000 UNIT) CAPS capsule Take 1 capsule (50,000 Units total) by mouth every 7 (seven) days.   [DISCONTINUED] lisinopril -hydrochlorothiazide  (ZESTORETIC ) 20-12.5 MG tablet Take 1 tablet by mouth daily.   [DISCONTINUED] rosuvastatin  (CRESTOR ) 20 MG tablet Take 1 tablet (20 mg total) by mouth daily.    [DISCONTINUED] tirzepatide  (ZEPBOUND ) 5 MG/0.5ML Pen Inject 5 mg into the skin once a week.   [DISCONTINUED] Vitamin D , Ergocalciferol , (DRISDOL ) 1.25 MG (50000 UNIT) CAPS capsule Take 1 capsule (50,000 Units total) by mouth every 7 (seven) days.   No facility-administered encounter medications on file as of 07/26/2024.    Surgical History: Past Surgical History:  Procedure Laterality Date   COLONOSCOPY WITH PROPOFOL  N/A 12/26/2015   Procedure: COLONOSCOPY WITH PROPOFOL ;  Surgeon: Rogelia Copping, MD;  Location: [facility];  Service: Endoscopy;  Laterality: N/A;   DILATATION & CURETTAGE/HYSTEROSCOPY WITH MYOSURE N/A 02/13/2018   Procedure: DILATATION & CURETTAGE/HYSTEROSCOPY WITH GENESYSN HTA THERMAL ABLATION;  Surgeon: Connell Davies, MD;  Location: [facility];  Service: Gynecology;  Laterality: N/A;   EXCISION MASS NECK Right 05/27/2015   Procedure: EXCISION MASS NECK RIGHT LIPOMA;  Surgeon: Deward Dolly, MD;  Location: [facility];  Service: ENT;  Laterality: Right;   TONSILLECTOMY     TUBAL LIGATION      Medical History: Past Medical History:  Diagnosis Date   Hypothyroidism    Thyroid  enlarged    pt reports being watched    Family History: Family History  Problem Relation Age of Onset   Hypertension Mother    Hypertension Father    Diabetes Father    Breast cancer Neg Hx     Social History   Socioeconomic History   Marital status: Single    Spouse name: Not on file   Number of children: Not on file  Years of education: Not on file   Highest education level: Not on file  Occupational History   Not on file  Tobacco Use   Smoking status: Never   Smokeless tobacco: Never  Vaping Use   Vaping status: Never Used  Substance and Sexual Activity   Alcohol use: Yes    Comment: OCC   Drug use: No   Sexual activity: Yes    Birth control/protection: Surgical  Other Topics Concern   Not on file  Social History Narrative   Not on file   Social Drivers of  Health   Tobacco Use: Low Risk (07/26/2024)   Patient History    Smoking Tobacco Use: Never    Smokeless Tobacco Use: Never    Passive Exposure: Not on file  Financial Resource Strain: Not on file  Food Insecurity: Not on file  Transportation Needs: Not on file  Physical Activity: Not on file  Stress: Not on file  Social Connections: Not on file  Intimate Partner Violence: Not on file  Depression (PHQ2-9): Low Risk (12/09/2023)   Depression (PHQ2-9)    PHQ-2 Score: 0  Alcohol Screen: Not on file  Housing: Not on file  Utilities: Not on file  Health Literacy: Not on file      Review of Systems  Constitutional:  Negative for activity change, appetite change, chills, fatigue, fever and unexpected weight change.  HENT: Negative.  Negative for congestion, ear pain, rhinorrhea, sore throat and trouble swallowing.   Eyes: Negative.   Respiratory: Negative.  Negative for cough, chest tightness, shortness of breath and wheezing.   Cardiovascular: Negative.  Negative for chest pain and palpitations.  Gastrointestinal: Negative.  Negative for abdominal pain, blood in stool, constipation, diarrhea, nausea and vomiting.  Endocrine: Negative.   Genitourinary: Negative.  Negative for difficulty urinating, dysuria, frequency, hematuria and urgency.  Musculoskeletal: Negative.  Negative for arthralgias, back pain, joint swelling, myalgias and neck pain.  Skin: Negative.  Negative for rash and wound.  Allergic/Immunologic: Negative.  Negative for immunocompromised state.  Neurological: Negative.  Negative for dizziness, seizures, numbness and headaches.  Hematological: Negative.   Psychiatric/Behavioral:  Positive for behavioral problems. Negative for self-injury, sleep disturbance and suicidal ideas. The patient is not nervous/anxious.     Vital Signs: BP 132/86   Pulse 93   Temp (!) 96.6 F (35.9 C)   Resp 16   Ht 5' 5 (1.651 m)   Wt 222 lb 9.6 oz (101 kg)   SpO2 94%   BMI 37.04  kg/m    Physical Exam Vitals reviewed.  Constitutional:      General: She is not in acute distress.    Appearance: Normal appearance. She is obese. She is not ill-appearing.  HENT:     Head: Normocephalic and atraumatic.  Eyes:     Pupils: Pupils are equal, round, and reactive to light.  Cardiovascular:     Rate and Rhythm: Normal rate and regular rhythm.  Pulmonary:     Effort: Pulmonary effort is normal. No respiratory distress.  Skin:    General: Skin is warm and dry.     Capillary Refill: Capillary refill takes less than 2 seconds.  Neurological:     Mental Status: She is alert and oriented to person, place, and time.  Psychiatric:        Mood and Affect: Mood normal.        Behavior: Behavior normal.        Assessment/Plan: 1. Essential hypertension (Primary) Stable, continue  lisinopril -hydrochlorothiazide  as prescribed.  - lisinopril -hydrochlorothiazide  (ZESTORETIC ) 20-12.5 MG tablet; Take 1 tablet by mouth daily.  Dispense: 90 tablet; Refill: 3  2. Mixed hyperlipidemia Continue rosuvastatin  as prescribed.  - rosuvastatin  (CRESTOR ) 20 MG tablet; Take 1 tablet (20 mg total) by mouth daily.  Dispense: 90 tablet; Refill: 3  3. Prediabetes A1c is stable at 6.2  4. Hypothyroidism due to Hashimoto's thyroiditis Continue synthroid  as prescribed.   5. Gastroesophageal reflux disease without esophagitis Continue pantoprazole  as prescribed.   6. Vitamin D  deficiency Take weekly vitamin D  supplement as prescribed.  - Vitamin D , Ergocalciferol , (DRISDOL ) 1.25 MG (50000 UNIT) CAPS capsule; Take 1 capsule (50,000 Units total) by mouth every 7 (seven) days.  Dispense: 12 capsule; Refill: 0  7. Morbid obesity (HCC) Will resent zepbound  and see if we can get insurance to approve. If not, will send prescription to [facility] pharmacy for wegovy pill instead.  - tirzepatide  (ZEPBOUND ) 5 MG/0.5ML Pen; Inject 5 mg into the skin once a week.  Dispense: 2 mL; Refill:  5   General Counseling: Ashley [name] verbalizes understanding of the findings of todays visit and agrees with plan of treatment. I have discussed any further diagnostic evaluation that may be needed or ordered today. We also reviewed her medications today. she has been encouraged to call the office with any questions or concerns that should arise related to todays visit.    No orders of the defined types were placed in this encounter.   Meds ordered this encounter  Medications   tirzepatide  (ZEPBOUND ) 5 MG/0.5ML Pen    Sig: Inject 5 mg into the skin once a week.    Dispense:  2 mL    Refill:  5    Dx code E66.01. please send prior auth request to fax # [phone]   lisinopril -hydrochlorothiazide  (ZESTORETIC ) 20-12.5 MG tablet    Sig: Take 1 tablet by mouth daily.    Dispense:  90 tablet    Refill:  3    Requesting 1 year supply   rosuvastatin  (CRESTOR ) 20 MG tablet    Sig: Take 1 tablet (20 mg total) by mouth daily.    Dispense:  90 tablet    Refill:  3    Please send a replace/new response with 90-Day Supply if appropriate to maximize member benefit. Requesting 1 year supply.   Vitamin D , Ergocalciferol , (DRISDOL ) 1.25 MG (50000 UNIT) CAPS capsule    Sig: Take 1 capsule (50,000 Units total) by mouth every 7 (seven) days.    Dispense:  12 capsule    Refill:  0    Return in about 1 month (around 08/25/2024) for F/U, eval new med, Ashley [name] PCP.   Total time spent: 30 Minutes Time spent includes review of chart, medications, test results, and follow up plan with the patient.   [place] Controlled Substance Database was reviewed by me.  This patient was seen by Mardy Maxin, FNP-C in collaboration with Dr. Sigrid Bathe as a part of collaborative care agreement.   Infiniti Hoefling R. Maxin, MSN, FNP-C Internal medicine

## 2024-07-27 ENCOUNTER — Telehealth: Payer: Self-pay | Admitting: Nurse Practitioner

## 2024-07-31 ENCOUNTER — Telehealth: Payer: Self-pay

## 2024-07-31 NOTE — Telephone Encounter (Signed)
 Patient denied for Zepbound , not covered by [facility].

## 2024-08-03 NOTE — Telephone Encounter (Signed)
 error

## 2024-08-23 ENCOUNTER — Ambulatory Visit: Admitting: Nurse Practitioner

## 2024-08-30 ENCOUNTER — Ambulatory Visit: Admitting: Nurse Practitioner

## 2024-12-10 ENCOUNTER — Encounter: Admitting: Nurse Practitioner
